# Patient Record
Sex: Male | Born: 1950
Health system: Southern US, Community
[De-identification: ages and names within clinical notes are randomized; demographics above are authoritative.]

## PROBLEM LIST (undated history)

## (undated) DIAGNOSIS — G629 Polyneuropathy, unspecified: Secondary | ICD-10-CM

## (undated) DIAGNOSIS — E786 Lipoprotein deficiency: Secondary | ICD-10-CM

## (undated) DIAGNOSIS — N182 Chronic kidney disease, stage 2 (mild): Secondary | ICD-10-CM

## (undated) DIAGNOSIS — E039 Hypothyroidism, unspecified: Secondary | ICD-10-CM

## (undated) DIAGNOSIS — E1122 Type 2 diabetes mellitus with diabetic chronic kidney disease: Secondary | ICD-10-CM

## (undated) DIAGNOSIS — I10 Essential (primary) hypertension: Secondary | ICD-10-CM

## (undated) DIAGNOSIS — F419 Anxiety disorder, unspecified: Secondary | ICD-10-CM

## (undated) DIAGNOSIS — G4730 Sleep apnea, unspecified: Secondary | ICD-10-CM

## (undated) DIAGNOSIS — G47419 Narcolepsy without cataplexy: Secondary | ICD-10-CM

## (undated) DIAGNOSIS — K219 Gastro-esophageal reflux disease without esophagitis: Secondary | ICD-10-CM

## (undated) DIAGNOSIS — K429 Umbilical hernia without obstruction or gangrene: Secondary | ICD-10-CM

## (undated) HISTORY — PX: COLONOSCOPY: SHX174

## (undated) HISTORY — DX: Essential (primary) hypertension: I10

## (undated) HISTORY — PX: OTHER SURGICAL HISTORY: SHX169

---

## 1998-02-25 ENCOUNTER — Ambulatory Visit (HOSPITAL_BASED_OUTPATIENT_CLINIC_OR_DEPARTMENT_OTHER): Admission: RE | Admit: 1998-02-25 | Discharge: 1998-02-25 | Payer: Self-pay | Admitting: Orthopedic Surgery

## 1998-03-29 ENCOUNTER — Ambulatory Visit: Admission: RE | Admit: 1998-03-29 | Discharge: 1998-03-29 | Payer: Self-pay | Admitting: Family Medicine

## 1998-06-10 ENCOUNTER — Ambulatory Visit: Admission: RE | Admit: 1998-06-10 | Discharge: 1998-06-10 | Payer: Self-pay | Admitting: Internal Medicine

## 1999-02-06 ENCOUNTER — Encounter (INDEPENDENT_AMBULATORY_CARE_PROVIDER_SITE_OTHER): Payer: Self-pay | Admitting: *Deleted

## 1999-02-06 ENCOUNTER — Ambulatory Visit (HOSPITAL_COMMUNITY): Admission: RE | Admit: 1999-02-06 | Discharge: 1999-02-06 | Payer: Self-pay | Admitting: Gastroenterology

## 2002-03-24 ENCOUNTER — Encounter: Admission: RE | Admit: 2002-03-24 | Discharge: 2002-06-22 | Payer: Self-pay | Admitting: Family Medicine

## 2003-03-12 ENCOUNTER — Encounter: Admission: RE | Admit: 2003-03-12 | Discharge: 2003-03-12 | Payer: Self-pay | Admitting: *Deleted

## 2003-11-04 ENCOUNTER — Ambulatory Visit (HOSPITAL_COMMUNITY): Admission: RE | Admit: 2003-11-04 | Discharge: 2003-11-04 | Payer: Self-pay | Admitting: Gastroenterology

## 2005-11-01 ENCOUNTER — Ambulatory Visit (HOSPITAL_COMMUNITY): Admission: RE | Admit: 2005-11-01 | Discharge: 2005-11-01 | Payer: Self-pay | Admitting: Surgery

## 2005-11-12 ENCOUNTER — Ambulatory Visit (HOSPITAL_COMMUNITY): Admission: RE | Admit: 2005-11-12 | Discharge: 2005-11-12 | Payer: Self-pay | Admitting: Surgery

## 2005-11-12 ENCOUNTER — Encounter: Admission: RE | Admit: 2005-11-12 | Discharge: 2005-11-12 | Payer: Self-pay | Admitting: Surgery

## 2006-02-04 ENCOUNTER — Encounter: Admission: RE | Admit: 2006-02-04 | Discharge: 2006-05-05 | Payer: Self-pay | Admitting: Surgery

## 2006-02-18 ENCOUNTER — Encounter (INDEPENDENT_AMBULATORY_CARE_PROVIDER_SITE_OTHER): Payer: Self-pay | Admitting: Specialist

## 2006-02-18 ENCOUNTER — Inpatient Hospital Stay (HOSPITAL_COMMUNITY): Admission: RE | Admit: 2006-02-18 | Discharge: 2006-02-19 | Payer: Self-pay | Admitting: Surgery

## 2006-02-18 HISTORY — PX: GASTRIC RESTRICTION SURGERY: SHX653

## 2009-06-28 HISTORY — PX: CARDIAC CATHETERIZATION: SHX172

## 2009-07-19 ENCOUNTER — Ambulatory Visit: Payer: Self-pay | Admitting: Cardiology

## 2009-07-19 ENCOUNTER — Observation Stay (HOSPITAL_COMMUNITY): Admission: EM | Admit: 2009-07-19 | Discharge: 2009-07-20 | Payer: Self-pay | Admitting: Emergency Medicine

## 2010-03-10 ENCOUNTER — Ambulatory Visit: Payer: Self-pay | Admitting: Women's Health

## 2010-07-24 LAB — LIPID PANEL
Cholesterol: 159 mg/dL (ref 0–200)
Cholesterol: 167 mg/dL (ref 0–200)
HDL: 24 mg/dL — ABNORMAL LOW (ref >39)
LDL Cholesterol: 109 mg/dL — ABNORMAL HIGH (ref 0–99)
LDL Cholesterol: 99 mg/dL (ref 0–99)
Total CHOL/HDL Ratio: 7 RATIO
Triglycerides: 172 mg/dL — ABNORMAL HIGH (ref <150)
Triglycerides: 174 mg/dL — ABNORMAL HIGH (ref <150)
VLDL: 34 mg/dL (ref 0–40)

## 2010-07-24 LAB — POCT CARDIAC MARKERS
CKMB, poc: <1 ng/mL — ABNORMAL LOW (ref 1.0–8.0)
CKMB, poc: <1 ng/mL — ABNORMAL LOW (ref 1.0–8.0)
Myoglobin, poc: 56.3 ng/mL (ref 12–200)
Myoglobin, poc: 73.5 ng/mL (ref 12–200)
Troponin i, poc: <0.05 ng/mL (ref 0.00–0.09)
Troponin i, poc: <0.05 ng/mL (ref 0.00–0.09)

## 2010-07-24 LAB — GLUCOSE, CAPILLARY
Glucose-Capillary: 115 mg/dL — ABNORMAL HIGH (ref 70–99)
Glucose-Capillary: 118 mg/dL — ABNORMAL HIGH (ref 70–99)
Glucose-Capillary: 124 mg/dL — ABNORMAL HIGH (ref 70–99)
Glucose-Capillary: 144 mg/dL — ABNORMAL HIGH (ref 70–99)
Glucose-Capillary: 161 mg/dL — ABNORMAL HIGH (ref 70–99)

## 2010-07-24 LAB — COMPREHENSIVE METABOLIC PANEL
ALT: 34 U/L (ref 0–53)
AST: 33 U/L (ref 0–37)
Albumin: 3.3 g/dL — ABNORMAL LOW (ref 3.5–5.2)
Alkaline Phosphatase: 92 U/L (ref 39–117)
BUN: 12 mg/dL (ref 6–23)
CO2: 29 mEq/L (ref 19–32)
Calcium: 8.9 mg/dL (ref 8.4–10.5)
Chloride: 105 mEq/L (ref 96–112)
Creatinine, Ser: 1.21 mg/dL (ref 0.4–1.5)
GFR calc Af Amer: >60 mL/min (ref >60)
GFR calc non Af Amer: >60 mL/min (ref >60)
Glucose, Bld: 131 mg/dL — ABNORMAL HIGH (ref 70–99)
Potassium: 4.2 mEq/L (ref 3.5–5.1)
Sodium: 142 mEq/L (ref 135–145)
Total Bilirubin: 0.7 mg/dL (ref 0.3–1.2)
Total Protein: 7.1 g/dL (ref 6.0–8.3)

## 2010-07-24 LAB — BASIC METABOLIC PANEL
BUN: 14 mg/dL (ref 6–23)
CO2: 27 mEq/L (ref 19–32)
Calcium: 8.9 mg/dL (ref 8.4–10.5)
Chloride: 105 mEq/L (ref 96–112)
Creatinine, Ser: 1.26 mg/dL (ref 0.4–1.5)
GFR calc Af Amer: >60 mL/min (ref >60)
GFR calc non Af Amer: 59 mL/min — ABNORMAL LOW (ref >60)
Glucose, Bld: 141 mg/dL — ABNORMAL HIGH (ref 70–99)
Potassium: 4.1 mEq/L (ref 3.5–5.1)
Sodium: 139 mEq/L (ref 135–145)

## 2010-07-24 LAB — CBC
HCT: 40.7 % (ref 39.0–52.0)
HCT: 41.8 % (ref 39.0–52.0)
Hemoglobin: 13.6 g/dL (ref 13.0–17.0)
Hemoglobin: 14.3 g/dL (ref 13.0–17.0)
MCHC: 33.4 g/dL (ref 30.0–36.0)
MCHC: 34.2 g/dL (ref 30.0–36.0)
MCV: 90.2 fL (ref 78.0–100.0)
MCV: 91.3 fL (ref 78.0–100.0)
Platelets: 197 10*3/uL (ref 150–400)
Platelets: 205 10*3/uL (ref 150–400)
RBC: 4.46 MIL/uL (ref 4.22–5.81)
RBC: 4.64 MIL/uL (ref 4.22–5.81)
RDW: 14.2 % (ref 11.5–15.5)
RDW: 14.3 % (ref 11.5–15.5)
WBC: 6.1 10*3/uL (ref 4.0–10.5)
WBC: 6.8 10*3/uL (ref 4.0–10.5)

## 2010-07-24 LAB — HEPATIC FUNCTION PANEL
ALT: 30 U/L (ref 0–53)
AST: 31 U/L (ref 0–37)
Albumin: 3.5 g/dL (ref 3.5–5.2)
Alkaline Phosphatase: 102 U/L (ref 39–117)
Bilirubin, Direct: 0.3 mg/dL (ref 0.0–0.3)
Indirect Bilirubin: 0.2 mg/dL — ABNORMAL LOW (ref 0.3–0.9)
Total Bilirubin: 0.5 mg/dL (ref 0.3–1.2)
Total Protein: 7.5 g/dL (ref 6.0–8.3)

## 2010-07-24 LAB — CK TOTAL AND CKMB (NOT AT ARMC)
CK, MB: 0.6 ng/mL (ref 0.3–4.0)
CK, MB: 0.6 ng/mL (ref 0.3–4.0)
CK, MB: 0.7 ng/mL (ref 0.3–4.0)
Relative Index: INVALID (ref 0.0–2.5)
Relative Index: INVALID (ref 0.0–2.5)
Relative Index: INVALID (ref 0.0–2.5)
Total CK: 56 U/L (ref 7–232)
Total CK: 62 U/L (ref 7–232)
Total CK: 70 U/L (ref 7–232)

## 2010-07-24 LAB — HEMOGLOBIN A1C
Hgb A1c MFr Bld: 6.8 % — ABNORMAL HIGH (ref 4.6–6.1)
Hgb A1c MFr Bld: 6.9 % — ABNORMAL HIGH (ref 4.6–6.1)
Mean Plasma Glucose: 148 mg/dL
Mean Plasma Glucose: 151 mg/dL

## 2010-07-24 LAB — DIFFERENTIAL
Basophils Absolute: 0.1 10*3/uL (ref 0.0–0.1)
Basophils Relative: 1 % (ref 0–1)
Eosinophils Absolute: 0.1 10*3/uL (ref 0.0–0.7)
Eosinophils Relative: 1 % (ref 0–5)
Lymphocytes Relative: 26 % (ref 12–46)
Lymphs Abs: 1.6 10*3/uL (ref 0.7–4.0)
Monocytes Absolute: 0.5 10*3/uL (ref 0.1–1.0)
Monocytes Relative: 9 % (ref 3–12)
Neutro Abs: 3.9 10*3/uL (ref 1.7–7.7)
Neutrophils Relative %: 64 % (ref 43–77)

## 2010-07-24 LAB — TROPONIN I: Troponin I: <0.01 ng/mL (ref 0.00–0.06)

## 2010-07-24 LAB — PROTIME-INR: Prothrombin Time: 13.7 seconds (ref 11.6–15.2)

## 2010-07-24 LAB — HEPARIN LEVEL (UNFRACTIONATED): Heparin Unfractionated: 0.23 IU/mL — ABNORMAL LOW (ref 0.30–0.70)

## 2010-09-15 NOTE — Op Note (Signed)
William Booth, William Booth               ACCOUNT NO.:  [id number]   MEDICAL RECORD NO.:  [id number]          PATIENT TYPE:  INP   LOCATION:  0152                         FACILITY:  [facility]   PHYSICIAN:  Sandria Bales. Ezzard Standing, M.D.  DATE OF BIRTH:  [birth year]   DATE OF PROCEDURE:  02/18/2006  DATE OF DISCHARGE:                                 OPERATIVE REPORT   PREOPERATIVE DIAGNOSIS:  Morbid obesity weight 351 with a BMI of  approximately 50.5.   POSTOPERATIVE DIAGNOSIS:  Morbid obesity weight 351 with a BMI of 50.5.   OPERATION PERFORMED:  Laparoscopic gastric band placement (AP large),  posterior vagotomy successful, but I was not able to find the anterior vagus  nerve.   SURGEON:  Sandria Bales. Ezzard Standing, M.D.   ASSISTANT:  Sharlet Salina T. Hoxworth, M.D.   ANESTHESIA:  General.   ESTIMATED BLOOD LOSS:  50 mL.   DRAINS:  None.   INDICATIONS FOR PROCEDURE:  Mr. Muckey is a 60 year old white male who has  been through our bariatric preoperative program included medical evaluation,  labs, x-rays, psych and nutritional evaluation. He has come for attempted  lap band placement.  He is also interested in our vagotomy study and  understands both indication and potential risks of the procedure.   The potential risks include but not limited to bleeding, infection, bowel  injury and long term nutritional consequences including slippage and erosion  of the bands.   DESCRIPTION OF PROCEDURE:  Patient in supine position with general  endotracheal anesthesia, given 1 g Ancef initiation of procedure.  He had  his abdomen shaved, prepped with Betadine solution and sterilely draped.  The abdomen was accessed with an Optiview trocar in the left upper  extremity.  I then placed five additional trocars, a 5 mm subxiphoid trocar  for the [facility] retractor, a 15 right subcostal trocar as a dissector, an  11 right paramedian trocar for the finger placement.  A left paramedian  trocar for the scope and a 5 mm  lateral subcostal trocar.     The abdomen was explored.  The right and left lobes of liver were large  and fatty but otherwise unremarkable.  Anterior wall of the stomach was  fatty and unremarkable.  He does have an umbilical hernia with incarcerated  fat in the hernia.  I left this alone during the procedure.  I placed a  Nathanson retractor on the left lobe of the liver and retracted this  anteriorly.   I then went up and started first with vagotomy, opening the gastrohepatic  ligament identifying the right crus of the diaphragm.  I was able to retract  the esophagus to the left side the crus down to the right and found a large  posterior vagus nerve which I placed clips on both sides and divided.  Photos of these views are on the chart.   I then worked for probably about 45 minutes on the anterior wall of the  stomach looking for a left anterior vagus but I was unsuccessful finding  anything that looked like a vagus nerve.  I also  placed a lighted bougie  down to see if I could see any denser material going through and I found  nothing there either.   So after about 45 minutes of dissection trying to identify an anterior vagus  nerve (I did not want to damage the esophagus), I terminated that part of  the dissection.   I then placed the finger behind the esophagus up at the ankle of His.  I  placed a lap band. I used the AP large band in place.   I then buttressed the stomach over the band using three interrupted 2-0  Ethibond sutures with lap ties on the end of these.  I placed this over a  sizer to make sure that it was not to tight.  The band then rotated, would  swivel easily after the stomach had been buttressed over the band.  I then  removed the end of the band out the right paramedian incision.   Each trocar was removed in turn.  There was not bleeding  in the trocar  site.  I then matured the wound in the right abdomen to place the port.  Sewed the port in place with  interrupted 2-0 Prolene sutures.  This made the  band lie flat.  Of note he only has about an  inch and a half of  subcutaneous fat so despite his large size, he actually most of his fat is  intra-abdominal not subcu.   I then closed each wound with first interrupted 2-0 Vicryl sutures, then 5-0  Monocryl suture in the fascia.  The wound is then painted with tincture of  benzoin and Steri-Stripped.  The patient was then transferred to the  recovery room in good condition.      Sandria Bales. Ezzard Standing, M.D.  Electronically Signed     DHN/MEDQ  D:  02/18/2006  T:  02/18/2006  Job:  [id number]   cc:   Caryn Bee L. Little, M.D.  Fax: [phone]

## 2010-09-15 NOTE — Op Note (Signed)
William Booth, William Booth                         ACCOUNT NO.:  [id number]   MEDICAL RECORD NO.:  [id number]                   PATIENT TYPE:  AMB   LOCATION:  ENDO                                 FACILITY:  [facility]   PHYSICIAN:  Bernette Redbird, M.D.                DATE OF BIRTH:  08-Oct-1950   DATE OF PROCEDURE:  11/04/2003  DATE OF DISCHARGE:                                 OPERATIVE REPORT   PROCEDURE:  Colonoscopy.   INDICATIONS FOR PROCEDURE:  Small volume hematochezia and prior history of  colonic adenoma removed five years ago in a 60 year old gentleman.   FINDINGS:  Normal exam to the cecum.   PROCEDURE:  The nature, purpose, and risks of the procedure were familiar to  the patient from prior examination who provided written consent.  Sedation  was administered on the conservative side in view of his significant obesity  and history of sleep apnea.  During the course of the procedure, he did  receive a total of Fentanyl 80 mcg and Versed 8 mg IV, but this resulted in  just mild sedation probably due to his ongoing outpatient use of medications  such as Zoloft plus his large body size.  There was no problem with  desaturation, apnea, or clinical instability during the course of the exam.  Digital exam of the prostate was limited by the patient's large body size  but the portion of the prostate gland I could feel was normal.   The Olympus adult video colonoscope was easily advanced around the colon to  the area just above the cecum and then the patient was turned into the  supine position and I was able to advance easily to the base of the cecum as  identified by clear visualization of the appendiceal orifice.  Pull back was  then performed.  The quality of the prep was excellent and it was felt that  all areas were well seen.  This was a normal examination.  No polyps,  polyps, cancer, colitis, vascular malformations, or diverticulosis were  noted.  Retroflexion in the rectum was  normal.  Reinspection of the rectum  was normal.  Pull out through the anal canal showed some slight excoriation  with mild internal hemorrhoids, nothing impressive.   IMPRESSION:  1. Rectal bleeding without obvious source endoscopically evident at this     time, but probably from excoriated     hemorrhoids (569.3).  2. Prior history of colonic adenoma without current abnormalities.   PLAN:  Follow up colonoscopy in five years.                                               Bernette Redbird, M.D.    RB/MEDQ  D:  11/04/2003  T:  11/04/2003  Job:  [id number]  cc:   Caryn Bee L. Little, M.D.  [address]  [place]  [place] [address]  Fax: [phone]

## 2010-10-06 ENCOUNTER — Encounter (INDEPENDENT_AMBULATORY_CARE_PROVIDER_SITE_OTHER): Payer: Self-pay | Admitting: Surgery

## 2011-03-08 ENCOUNTER — Encounter (INDEPENDENT_AMBULATORY_CARE_PROVIDER_SITE_OTHER): Payer: Self-pay | Admitting: Surgery

## 2011-03-09 ENCOUNTER — Encounter (INDEPENDENT_AMBULATORY_CARE_PROVIDER_SITE_OTHER): Payer: Self-pay

## 2011-03-09 ENCOUNTER — Ambulatory Visit (INDEPENDENT_AMBULATORY_CARE_PROVIDER_SITE_OTHER): Payer: 59 | Admitting: Physician Assistant

## 2011-03-09 VITALS — BP 130/86 | HR 70 | Temp 97.4°F | Resp 14 | Ht 71.0 in | Wt 269.2 lb

## 2011-03-09 DIAGNOSIS — Z4651 Encounter for fitting and adjustment of gastric lap band: Secondary | ICD-10-CM

## 2011-03-09 NOTE — Patient Instructions (Signed)
Return in one week if symptoms persist.

## 2011-03-09 NOTE — Progress Notes (Signed)
  HISTORY: William Booth is a 60 y.o.male who received an AP-Large lap-band in October 2007 by Dr. Ezzard Standing. He said in the past month he's been having reflux symptoms which has been helped with a recent Rx for PPI but his symptoms persist.  VITAL SIGNS: Filed Vitals:   03/09/11 1348  BP: 130/86  Pulse: 70  Temp: 97.4 F (36.3 C)  Resp: 14    PHYSICAL EXAM: Physical exam reveals a very well-appearing 60 y.o.male in no apparent distress Neurologic: Awake, alert, oriented Psych: Bright affect, conversant Respiratory: Breathing even and unlabored. No stridor or wheezing Abdomen: Soft, nontender, nondistended to palpation. Incisions well-healed. No incisional hernias. Port easily palpated. Extremities: Atraumatic, good range of motion.  ASSESMENT: 60 y.o.  male  s/p AP-Large lap-band.   PLAN: The patient's port was accessed with a 20G Huber needle without difficulty. Clear fluid was aspirated and 0.25 mL saline was removed from the port to give a total predicted volume of 9.5 mL. The patient was able to swallow water without difficulty following the procedure and was instructed to take clear liquids for the next 24-48 hours and advance slowly as tolerated.

## 2011-03-20 ENCOUNTER — Ambulatory Visit (INDEPENDENT_AMBULATORY_CARE_PROVIDER_SITE_OTHER): Payer: 59 | Admitting: *Deleted

## 2011-03-20 DIAGNOSIS — Z23 Encounter for immunization: Secondary | ICD-10-CM

## 2011-04-13 ENCOUNTER — Encounter (INDEPENDENT_AMBULATORY_CARE_PROVIDER_SITE_OTHER): Payer: 59

## 2011-04-18 ENCOUNTER — Encounter (INDEPENDENT_AMBULATORY_CARE_PROVIDER_SITE_OTHER): Payer: Self-pay | Admitting: Surgery

## 2011-04-20 ENCOUNTER — Ambulatory Visit (INDEPENDENT_AMBULATORY_CARE_PROVIDER_SITE_OTHER): Payer: 59 | Admitting: Physician Assistant

## 2011-04-20 ENCOUNTER — Encounter (INDEPENDENT_AMBULATORY_CARE_PROVIDER_SITE_OTHER): Payer: Self-pay

## 2011-04-20 VITALS — BP 124/78 | Ht 71.0 in | Wt 287.2 lb

## 2011-04-20 DIAGNOSIS — Z4651 Encounter for fitting and adjustment of gastric lap band: Secondary | ICD-10-CM

## 2011-04-20 NOTE — Progress Notes (Signed)
  HISTORY: William Booth is a 60 y.o.male who received an AP-Large lap-band in October 2007 by Dr. Ezzard Standing. He was seen last month for GERD symptoms for which 0.25 mL was removed. Since then his symptoms have resolved although he has significant hunger and increased portion sizes as well as weight gain. He's requesting a fill today, but just shy of the last total volume. His last upward adjustment lasted 4 months without untoward symptoms.  VITAL SIGNS: Filed Vitals:   04/20/11 1030  BP: 124/78    PHYSICAL EXAM: Physical exam reveals a very well-appearing 60 y.o.male in no apparent distress Neurologic: Awake, alert, oriented Psych: Bright affect, conversant Respiratory: Breathing even and unlabored. No stridor or wheezing Abdomen: Soft, nontender, nondistended to palpation. Incisions well-healed. No incisional hernias. Port easily palpated. Extremities: Atraumatic, good range of motion.  ASSESMENT: 60 y.o.  male  s/p AP-Large lap-band.   PLAN: The patient's port was accessed with a 20G Huber needle without difficulty. Clear fluid was aspirated and 0.2 mL saline was added to the port to give a total predicted volume of 9.7 mL. The patient was able to swallow water without difficulty following the procedure and was instructed to take clear liquids for the next 24-48 hours and advance slowly as tolerated.

## 2011-04-20 NOTE — Patient Instructions (Signed)
Take clear liquids for the next 48 hours. Thin protein shakes are ok to start on Saturday evening. Call us if you have persistent vomiting or regurgitation, night cough or reflux symptoms. Return as scheduled or sooner if you notice no changes in hunger/portion sizes.   

## 2011-09-03 ENCOUNTER — Encounter (INDEPENDENT_AMBULATORY_CARE_PROVIDER_SITE_OTHER): Payer: Self-pay | Admitting: Surgery

## 2011-12-03 ENCOUNTER — Encounter: Payer: Self-pay | Admitting: Internal Medicine

## 2011-12-03 ENCOUNTER — Ambulatory Visit (INDEPENDENT_AMBULATORY_CARE_PROVIDER_SITE_OTHER): Payer: 59 | Admitting: Internal Medicine

## 2011-12-03 VITALS — BP 130/82 | HR 73 | Ht 70.0 in | Wt 278.2 lb

## 2011-12-03 DIAGNOSIS — G4733 Obstructive sleep apnea (adult) (pediatric): Secondary | ICD-10-CM

## 2011-12-03 NOTE — Patient Instructions (Addendum)
Order - DME needs to establish with new company for CPAP   Needs autotitration 5-20 x 7 days for dx OSA  Sample Nuvigil 150 mg, 1 daily as needed

## 2011-12-03 NOTE — Progress Notes (Signed)
12/03/11- 62 yoM never smoker formerly a patient of my old practice, now seeking to reestablish because of sleep apnea. PCP Dr Kirtland Bouchard. William Booth Complains of daytime sleepiness, falling asleep during meetings and with long driving. He has remained fully compliant with his CPAP 11, getting supplies on line. Bedtime midnight to 1:30 AM with sleep latency less than 15 minutes and not waking until he gets up between 9 and 10 AM. 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 Wife tells him he does not snore through his current CPAP, using nasal pillows mask. Occasionally uses Elavil 100 mg as a sleeping pill only when traveling. He has been very heavy but says he lost over 100 pounds in 4 years. Lab band surgery at 389 pounds, down now around 278 pounds. No ENT surgery. Medical problems include hypertension, diabetes, allergic rhinitis. GERD is controlled with omeprazole.  Prior to Admission medications   Medication Sig Start Date End Date Taking? Authorizing Provider  ACCU-CHEK FASTCLIX LANCETS MISC  12/26/10  Yes Historical Provider, MD  ACCU-CHEK SMARTVIEW test strip  12/26/10  Yes Historical Provider, MD  ALPRAZolam Prudy Feeler) 0.5 MG tablet Ad lib. 02/16/11  Yes Historical Provider, MD  amitriptyline (ELAVIL) 100 MG tablet Ad lib. 02/14/11  Yes Historical Provider, MD  aspirin 81 MG tablet Take 81 mg by mouth daily.     Yes Historical Provider, MD  atenolol (TENORMIN) 50 MG tablet Take 50 mg by mouth 2 (two) times daily.   Yes Historical Provider, MD  fenofibrate 160 MG tablet Take 160 mg by mouth daily.   Yes Historical Provider, MD  fluticasone (FLONASE) 50 MCG/ACT nasal spray Ad lib. 12/25/10  Yes Historical Provider, MD  gabapentin (NEURONTIN) 300 MG capsule Take 1 tablet by mouth Three times a day. 10/31/11  Yes Historical Provider, MD  glimepiride (AMARYL) 4 MG tablet Take 4 mg by mouth 2 (two) times daily.   Yes Historical Provider, MD    losartan-hydrochlorothiazide (HYZAAR) 100-25 MG per tablet Take 1 tablet by mouth daily.     Yes Historical Provider, MD  metFORMIN (GLUCOPHAGE) 1000 MG tablet Take 1,000 mg by mouth 2 (two) times daily with a meal.     Yes Historical Provider, MD  niacin (NIASPAN) 500 MG CR tablet Take 500 mg by mouth at bedtime.   Yes Historical Provider, MD  NON FORMULARY CPAP set on 11   Yes Historical Provider, MD  omeprazole (PRILOSEC) 40 MG capsule Take 40 mg by mouth daily.   Yes Historical Provider, MD  rosuvastatin (CRESTOR) 20 MG tablet Take 20 mg by mouth daily.   Yes Historical Provider, MD   pmh Past Surgical History  Procedure Date  . Gastric restriction surgery 02/18/06    lap band  . Broken finger     right hand   History reviewed. No pertinent family history. History   Social History  . Marital Status: Married    Spouse Name: N/A    Number of Children: N/A  . Years of Education: N/A   Occupational History  . Not on file.   Social History Main Topics  . Smoking status: Never Smoker   . Smokeless tobacco: Never Used  . Alcohol Use: No  . Drug Use: No  . Sexually Active:    Other Topics Concern  . Not on file   Social History Narrative  . No narrative on file   ROS-see HPI Constitutional:   + weight loss, No-night sweats,  fevers, chills, fatigue, lassitude. HEENT:   No-  headaches, difficulty swallowing, tooth/dental problems, sore throat,       No-  sneezing, itching, ear ache, nasal congestion, post nasal drip,  CV:  No-   chest pain, orthopnea, PND, swelling in lower extremities, anasarca,  dizziness, palpitations Resp: No-   shortness of breath with exertion or at rest.              No-   productive cough,  No non-productive cough,  No- coughing up of blood.              No-   change in color of mucus.  No- wheezing.   Skin: No-   rash or lesions. GI:  No-   heartburn, indigestion, abdominal pain, nausea, vomiting, diarrhea,                 change in bowel  habits, loss of appetite GU: No-   dysuria, change in color of urine, no urgency or frequency.  No- flank pain. MS:  No-   joint pain or swelling.  No- decreased range of motion.  No- back pain. Neuro-     nothing unusual Psych:  No- change in mood or affect. No depression or anxiety.  No memory loss.  OBJ- Physical Exam General- Alert, Oriented, Affect-appropriate, Distress- none acute, obese Skin- rash-none, lesions- none, excoriation- none Lymphadenopathy- none Head- atraumatic            Eyes- Gross vision intact, PERRLA, conjunctivae and secretions clear            Ears- Hearing, canals-normal            Nose- Clear, no-Septal dev, mucus, polyps, erosion, perforation             Throat- Mallampati III , mucosa clear , drainage- none, tonsils- atrophic Neck- flexible , trachea midline, no stridor , thyroid nl, carotid no bruit Chest - symmetrical excursion , unlabored           Heart/CV- RRR , no murmur , no gallop  , no rub, nl s1 s2                           - JVD- none , edema- none, stasis changes- none, varices- none           Lung- clear to P&A, wheeze- none, cough- none , dullness-none, rub- none           Chest wall-  Abd- tender-no, distended-no, bowel sounds-present, HSM- no Br/ Gen/ Rectal- Not done, not indicated Extrem- cyanosis- none, clubbing, none, atrophy- none, strength- nl Neuro- grossly intact to observation

## 2011-12-08 DIAGNOSIS — G4733 Obstructive sleep apnea (adult) (pediatric): Secondary | ICD-10-CM | POA: Insufficient documentation

## 2011-12-08 NOTE — Assessment & Plan Note (Signed)
He has been compliant with CPAP. We will auto titrate to confirm best pressure setting the problem may be residual hypersomnia despite adequate treatment. Neck is he will and allergic medication. We're giving samples of Nuvigil 150 for trial pending return.

## 2011-12-12 ENCOUNTER — Telehealth: Payer: Self-pay | Admitting: Internal Medicine

## 2011-12-12 NOTE — Telephone Encounter (Signed)
I spoke with pt and he stated the samples that was giving to him at last OV 12/03/11 of nuvigil 150 mg has been working well for him. He has a coupon to get the first 30 tablets free from the manufacture. He would like an rx called in for this. Please advise Dr. Maple Hudson thanks  Pt aware he is out of the office today.

## 2011-12-13 MED ORDER — ARMODAFINIL 150 MG PO TABS: 150.0000 mg | ORAL_TABLET | Freq: Every day | ORAL | Status: DC

## 2011-12-13 NOTE — Telephone Encounter (Signed)
Per CY, ok to fill Rx Nuvigil 150 mg #30  1 qam prn. x0RF [facility] [address]  LMOM letting patient no that medication phoned-in to [facility] [address]

## 2011-12-24 ENCOUNTER — Encounter: Payer: Self-pay | Admitting: Internal Medicine

## 2012-01-04 ENCOUNTER — Ambulatory Visit (INDEPENDENT_AMBULATORY_CARE_PROVIDER_SITE_OTHER): Payer: 59 | Admitting: Internal Medicine

## 2012-01-04 ENCOUNTER — Encounter: Payer: Self-pay | Admitting: Internal Medicine

## 2012-01-04 VITALS — BP 120/74 | HR 77 | Ht 70.0 in | Wt 279.6 lb

## 2012-01-04 DIAGNOSIS — G4733 Obstructive sleep apnea (adult) (pediatric): Secondary | ICD-10-CM

## 2012-01-04 DIAGNOSIS — Z23 Encounter for immunization: Secondary | ICD-10-CM

## 2012-01-04 MED ORDER — MODAFINIL 200 MG PO TABS: 200.0000 mg | ORAL_TABLET | Freq: Every day | ORAL | Status: DC

## 2012-01-04 NOTE — Patient Instructions (Addendum)
Order [facility]- increase CPAP to 12  Script for Provigil 200. Try this once daily as needed

## 2012-01-04 NOTE — Progress Notes (Signed)
12/03/11- 74 yoM never smoker formerly a patient of my old practice, now seeking to reestablish because of sleep apnea. PCP Dr Kirtland Bouchard. William Booth Complains of daytime sleepiness, falling asleep during meetings and with long driving. He has remained fully compliant with his CPAP 11, getting supplies on line. Bedtime midnight to 1:30 AM with sleep latency less than 15 minutes and not waking until he gets up between 9 and 10 AM. 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 Wife tells him he does not snore through his current CPAP, using nasal pillows mask. Occasionally uses Elavil 100 mg as a sleeping pill only when traveling. He has been very heavy but says he lost over 100 pounds in 4 years. Lab band surgery at 389 pounds, down now around 278 pounds. No ENT surgery. Medical problems include hypertension, diabetes, allergic rhinitis. GERD is controlled with omeprazole.  01/04/12- 68 yoM never smoker formerly a patient of my old practice, now seeking to reestablish because of sleep apnea. PCP Dr Kirtland Bouchard. [name]. Machine changed out. Download is still pending. Sample Nuvigil 150 helped but was not quite strong enough for residual daytime sleepiness. We discussed medication alternatives, sleep hygiene, naps and expectations from optimal CPAP control.  ROS-see HPI Constitutional:   + weight loss, No-night sweats, fevers, chills,+ fatigue, lassitude. HEENT:   No-  headaches, difficulty swallowing, tooth/dental problems, sore throat,       No-  sneezing, itching, ear ache, nasal congestion, post nasal drip,  CV:  No-   chest pain, orthopnea, PND, swelling in lower extremities, anasarca,  dizziness, palpitations Resp: No-   shortness of breath with exertion or at rest.              No-   productive cough,  No non-productive cough,  No- coughing up of blood.              No-   change in color of mucus.  No- wheezing.   Skin: No-   rash or lesions. GI:  No-    heartburn, indigestion, abdominal pain, nausea, vomiting,  GU: MS:  No-   joint pain or swelling.   Neuro-     nothing unusual Psych:  No- change in mood or affect. No depression or anxiety.  No memory loss.  OBJ- Physical Exam General- Alert, Oriented, Affect-appropriate, Distress- none acute, obese Skin- rash-none, lesions- none, excoriation- none Lymphadenopathy- none Head- atraumatic            Eyes- Gross vision intact, PERRLA, conjunctivae and secretions clear            Ears- Hearing, canals-normal            Nose- Clear, no-Septal dev, mucus, polyps, erosion, perforation             Throat- Mallampati III , mucosa clear , drainage- none, tonsils- atrophic Neck- flexible , trachea midline, no stridor , thyroid nl, carotid no bruit Chest - symmetrical excursion , unlabored           Heart/CV- RRR , no murmur , no gallop  , no rub, nl s1 s2                           - JVD- none , edema- none, stasis changes- none, varices- none           Lung- clear to P&A, wheeze- none, cough- none ,  dullness-none, rub- none           Chest wall-  Abd-  Br/ Gen/ Rectal- Not done, not indicated Extrem- cyanosis- none, clubbing, none, atrophy- none, strength- nl Neuro- grossly intact to observation

## 2012-01-12 NOTE — Assessment & Plan Note (Signed)
Weight is now down to 279 pounds. He feels very comfortable with CPAP. Pending the download, he thinks it indicated a pressure of 12 he is now on 11, so we'll make a lot of difference. We discussed medication options we'll try cheaper generic. Plan-increase CPAP to 12.Try Provigil instead of Nuvigil.

## 2012-01-18 ENCOUNTER — Telehealth: Payer: Self-pay | Admitting: Internal Medicine

## 2012-01-18 NOTE — Telephone Encounter (Signed)
Spoke with patient-aware that I am calling [facility] [address] to get PA information faxed to me; states he would also like to get 90 days supply of Provigil(generic) as it costs the same as a 30 days supply. Will get CY to approve this once PA is completed and approved.

## 2012-01-25 NOTE — Telephone Encounter (Signed)
William Booth, has PA came through yet?

## 2012-01-25 NOTE — Telephone Encounter (Signed)
Pt aware that I am working with [employer] about this. Will keep him updated on Monday.

## 2012-01-25 NOTE — Telephone Encounter (Signed)
Pt is calling to see about this rx.  Please call him back @ [phone] Leanora Ivanoff

## 2012-01-28 NOTE — Telephone Encounter (Signed)
Insurance had to refax information and I have now faxed back as a URGENT request to insurance company for approval/denial.

## 2012-01-29 NOTE — Telephone Encounter (Signed)
I spoke with pt and is aware William Booth is working on this and had re faxed information back over yesterday. Will forward to [name] to follow up on

## 2012-01-29 NOTE — Telephone Encounter (Signed)
Pt called back- says he called [facility] today (this is where the auth needs to go thru per pt) and was told they are still waiting on this. Pt needs this to be resolved today "please"- member ID if needed is [id number]. Pt ph# [phone]. William Booth

## 2012-01-31 ENCOUNTER — Encounter (INDEPENDENT_AMBULATORY_CARE_PROVIDER_SITE_OTHER): Payer: Self-pay

## 2012-01-31 ENCOUNTER — Ambulatory Visit (INDEPENDENT_AMBULATORY_CARE_PROVIDER_SITE_OTHER): Payer: 59 | Admitting: Physician Assistant

## 2012-01-31 VITALS — BP 124/68 | HR 76 | Temp 98.5°F | Resp 16 | Ht 71.0 in | Wt 280.0 lb

## 2012-01-31 DIAGNOSIS — Z6839 Body mass index (BMI) 39.0-39.9, adult: Secondary | ICD-10-CM

## 2012-01-31 DIAGNOSIS — Z4651 Encounter for fitting and adjustment of gastric lap band: Secondary | ICD-10-CM

## 2012-01-31 DIAGNOSIS — K429 Umbilical hernia without obstruction or gangrene: Secondary | ICD-10-CM

## 2012-01-31 NOTE — Progress Notes (Signed)
  HISTORY: William Booth is a 61 y.o.male who received an AP-Large lap-band in October 2007 by Dr. Ezzard Standing. He comes in with no complaints of regurgitation or reflux but has increasing hunger and portion sizes. He also reports feeling a bulge from his umbilicus when he stands up. 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 He denies persistent pain. He's had no difficulty passing flatus or stool. There has been no redness or tenderness.  VITAL SIGNS: Filed Vitals:   01/31/12 1100  BP: 124/68  Pulse: 76  Temp: 98.5 F (36.9 C)  Resp: 16    PHYSICAL EXAM: Physical exam reveals a very well-appearing 61 y.o.male in no apparent distress Neurologic: Awake, alert, oriented Psych: Bright affect, conversant Respiratory: Breathing even and unlabored. No stridor or wheezing Abdomen: Soft, nontender, nondistended to palpation. Incisions well-healed. No incisional hernias. Port easily palpated. He has what appears to be a small, nontender umbilical hernia that I was unable to reduce. Extremities: Atraumatic, good range of motion.  ASSESMENT: 61 y.o.  male  s/p AP-Large lap-band.  New diagnosis of umbilical hernia  PLAN: The patient's port was accessed with a 20G Huber needle without difficulty. Clear fluid was aspirated and 0.2 mL saline was added to the port to give a total predicted volume of 9.9 mL. The patient was able to swallow water without difficulty following the procedure and was instructed to take clear liquids for the next 24-48 hours and advance slowly as tolerated. We have discussed his umbilical hernia with Dr. Ezzard Standing who will see him in the upcoming weeks to establish a treatment plan. We discussed strict return/contact criteria including obstructive symptoms/infection.

## 2012-01-31 NOTE — Patient Instructions (Signed)
Take clear liquids tonight. Thin protein shakes are ok to start tomorrow morning. Slowly advance your diet thereafter. Call us if you have persistent vomiting or regurgitation, night cough or reflux symptoms. Return as scheduled or sooner if you notice no changes in hunger/portion sizes. Also call the office if you have increasing pain, redness or swelling from your belly button. Call the office immediately if you have abdominal distension, nausea/vomiting and constipation/unable to pass gas.

## 2012-02-01 MED ORDER — MODAFINIL 200 MG PO TABS: 200.0000 mg | ORAL_TABLET | Freq: Every day | ORAL | Status: DC

## 2012-02-01 NOTE — Telephone Encounter (Signed)
I spoke with [employer] about PA and had to give more verbal information about patient. PA was approved for 1 year and patient able to get 90 day supply at local [facility] or [facility]. I have left message on patients phone number to call and speak with me regarding this matter. I will need to see if patient would like to come by and pick up Rx or have Rx mailed to his home address on file.

## 2012-02-01 NOTE — Telephone Encounter (Signed)
Pt returned my call-aware of approval for 1 year and requests to pick up 90 day supply RX. Pt aware that he can come by today and pick up. Pt stated he is on his way. Rx at my desk to give to patient.

## 2012-02-21 ENCOUNTER — Encounter (INDEPENDENT_AMBULATORY_CARE_PROVIDER_SITE_OTHER): Payer: 59

## 2012-02-21 ENCOUNTER — Other Ambulatory Visit (INDEPENDENT_AMBULATORY_CARE_PROVIDER_SITE_OTHER): Payer: Self-pay | Admitting: Surgery

## 2012-02-21 ENCOUNTER — Encounter (INDEPENDENT_AMBULATORY_CARE_PROVIDER_SITE_OTHER): Payer: Self-pay | Admitting: Surgery

## 2012-02-21 ENCOUNTER — Ambulatory Visit (INDEPENDENT_AMBULATORY_CARE_PROVIDER_SITE_OTHER): Payer: 59 | Admitting: Surgery

## 2012-02-21 VITALS — BP 140/88 | HR 72 | Resp 12 | Ht 71.0 in | Wt 276.0 lb

## 2012-02-21 DIAGNOSIS — Z9884 Bariatric surgery status: Secondary | ICD-10-CM | POA: Insufficient documentation

## 2012-02-21 DIAGNOSIS — K429 Umbilical hernia without obstruction or gangrene: Secondary | ICD-10-CM | POA: Insufficient documentation

## 2012-02-21 NOTE — Progress Notes (Signed)
[facility]  William Garden, MD,  FACS [address]  [address] [place], [place]    [address] Phone:  [phone] FAX:  [phone]   Re:   William Booth DOB:   03/11/1951 MRN:   [id number]  ASSESSMENT AND PLAN: 1.  History of lap band, AP Large - 02/18/2006  Initial weight - 348, BMI - 49.6  Still he thinks that his lap band is too loose.  I added 0.2 cc today - total approx 10.1 cc.  2. Umbilical hernia.  The hernia has enlarged and is bothering him.  I discussed the indications and complications of hernia surgery with the patient.  I discussed both the laparoscopic and open approach to hernia repair..  The potential risks of hernia surgery include, but are not limited to, bleeding, infection, open surgery, nerve injury, and recurrence of the hernia.  I provided the patient literature about hernia surgery.  3.  DM 4.  HTN  Doing better with this. 5.  OSA  Sees Dr. C. Young.  On CPAP. 6.  Anxiety   HISTORY OF PRESENT ILLNESS: Chief Complaint  Patient presents with  . Lap Band Fill  . Incisional Hernia    William Booth is a 61 y.o. (DOB: 06/08/1950)  white male who is a patient of LITTLE,KEVIN LORNE, MD and comes to me today for follow up of lap band and to discuss repair of umbilical hernia.  He still thinks that he needs a small adjustment for the lap band.  He is walking about 1/2 mile BID.  I encouraged him to get up to 3 miles per day.  Secondly, I talked about adding solid food to his breakfast.  We discussed the "green" zone for the lap band and he expressed an understanding of what we are looking for in eating.  Then we talked about his umibilcal hernia.  He has noticed this about one year and it has become larger.  It is bothering him and has enlarged.  Current Outpatient Prescriptions  Medication Sig Dispense Refill  . ACCU-CHEK FASTCLIX LANCETS MISC       . ACCU-CHEK SMARTVIEW test strip       . ALPRAZolam (XANAX) 0.5 MG tablet Ad  lib.      . amitriptyline (ELAVIL) 100 MG tablet Ad lib.      . Armodafinil 150 MG tablet Take 150 mg by mouth daily.      . aspirin 81 MG tablet Take 81 mg by mouth daily.        . atenolol (TENORMIN) 50 MG tablet Take 50 mg by mouth 2 (two) times daily.      . fenofibrate 160 MG tablet Take 160 mg by mouth daily.      . FLUoxetine (PROZAC) 40 MG capsule Take 1 tablet by mouth daily.      . fluticasone (FLONASE) 50 MCG/ACT nasal spray Ad lib.      . glimepiride (AMARYL) 4 MG tablet Take 4 mg by mouth 2 (two) times daily.      . losartan-hydrochlorothiazide (HYZAAR) 100-25 MG per tablet Take 1 tablet by mouth daily.        . metFORMIN (GLUCOPHAGE) 1000 MG tablet Take 1,000 mg by mouth 2 (two) times daily with a meal.        . modafinil (PROVIGIL) 200 MG tablet Take 1 tablet (200 mg total) by mouth daily.  90 tablet  0  . niacin (NIASPAN) 500 MG CR tablet Take 500   mg by mouth at bedtime.      . NON FORMULARY CPAP set on 11      . omeprazole (PRILOSEC) 40 MG capsule Take 40 mg by mouth daily.      . rosuvastatin (CRESTOR) 20 MG tablet Take 20 mg by mouth daily.       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  PHYSICAL EXAM: BP 140/88  Pulse 72  Resp 12  Ht 5' 11" (1.803 m)  Wt 276 lb (125.193 kg)  BMI 38.49 kg/m2  General: Obese WN WM who is alert and generally healthy appearing.  HEENT: Normal. Pupils equal. Good dentition. Neck: Supple. No mass.  No thyroid mass.   Lymph Nodes:  No supraclavicular or cervical nodes. Lungs: Clear to auscultation and symmetric breath sounds. Heart:  RRR. No murmur or rub.  Abdomen: Soft. No mass. No tenderness. Normal bowel sounds.  Umbilical hernia, approx 3+cm mass.  Lap band port in RUQ. Rectal: Not done. Extremities:  Good strength and ROM  in upper and lower extremities. Neurologic:  Grossly intact to motor and sensory function. Psychiatric: Has normal mood and affect. Behavior is normal.   Procedure:  I  accessed his lap band.  There is pressure in the band which pushes back 1.0 cc. He has about 9.9 cc that I measure in the lap band.   I added 0.2 cc of fluid for a total of 10.1 cc.  DATA REVIEWED: Reviewed chart.  Damante Spragg, MD, FACS Office:  [phone]  

## 2012-02-28 HISTORY — PX: TOOTH EXTRACTION: SUR596

## 2012-03-06 ENCOUNTER — Ambulatory Visit (INDEPENDENT_AMBULATORY_CARE_PROVIDER_SITE_OTHER): Payer: 59 | Admitting: Physician Assistant

## 2012-03-06 ENCOUNTER — Encounter (INDEPENDENT_AMBULATORY_CARE_PROVIDER_SITE_OTHER): Payer: Self-pay

## 2012-03-06 VITALS — BP 134/90 | HR 82 | Ht 71.0 in | Wt 268.2 lb

## 2012-03-06 DIAGNOSIS — Z4651 Encounter for fitting and adjustment of gastric lap band: Secondary | ICD-10-CM

## 2012-03-06 NOTE — Progress Notes (Signed)
  HISTORY: William Booth is a 61 y.o.male who received an AP-Large lap-band in October 2007 by Dr. Ezzard Standing. He comes in 2 weeks after an adjustment with one week of persistent regurgitation and borderline intolerance of liquids which began after a dental procedure.  VITAL SIGNS: Filed Vitals:   03/06/12 1257  BP: 134/90  Pulse: 82    PHYSICAL EXAM: Physical exam reveals a very well-appearing 61 y.o.male in no apparent distress Neurologic: Awake, alert, oriented Psych: Bright affect, conversant Respiratory: Breathing even and unlabored. No stridor or wheezing Abdomen: Soft, nontender, nondistended to palpation. Incisions well-healed. No incisional hernias. Port easily palpated. Extremities: Atraumatic, good range of motion.  ASSESMENT: 61 y.o.  male  s/p AP-Large lap-band.   PLAN: The patient's port was accessed with a 20G Huber needle without difficulty. Clear fluid was aspirated and 1.2 mL saline was removed from the port to give a total predicted volume of 8.9 mL. The patient was able to swallow water without difficulty following the procedure. We'll have him follow up with Dr. Ezzard Standing following his herniorraphy.

## 2012-03-06 NOTE — Patient Instructions (Signed)
Return to see Dr. Newman as scheduled. Focus on good food choices as well as physical activity. Return sooner if you have an increase in hunger, portion sizes or weight. Return also for difficulty swallowing, night cough, reflux.   

## 2012-03-14 ENCOUNTER — Encounter (HOSPITAL_COMMUNITY): Payer: Self-pay

## 2012-03-14 ENCOUNTER — Ambulatory Visit (HOSPITAL_COMMUNITY)
Admission: RE | Admit: 2012-03-14 | Discharge: 2012-03-14 | Disposition: A | Discharge status: Home / Self Care | Payer: 59 | Source: Ambulatory Visit | Attending: Surgery | Admitting: Surgery

## 2012-03-14 ENCOUNTER — Encounter (HOSPITAL_COMMUNITY): Payer: Self-pay | Admitting: Pharmacy Technician

## 2012-03-14 ENCOUNTER — Ambulatory Visit (HOSPITAL_COMMUNITY)
Admission: RE | Admit: 2012-03-14 | Discharge: 2012-03-14 | Disposition: A | Discharge status: Home / Self Care | Payer: 59 | Source: Ambulatory Visit | Attending: Anesthesiology | Admitting: Anesthesiology

## 2012-03-14 DIAGNOSIS — Z01812 Encounter for preprocedural laboratory examination: Secondary | ICD-10-CM | POA: Insufficient documentation

## 2012-03-14 DIAGNOSIS — Z01810 Encounter for preprocedural cardiovascular examination: Secondary | ICD-10-CM | POA: Insufficient documentation

## 2012-03-14 DIAGNOSIS — Z01818 Encounter for other preprocedural examination: Secondary | ICD-10-CM | POA: Insufficient documentation

## 2012-03-14 HISTORY — DX: Umbilical hernia without obstruction or gangrene: K42.9

## 2012-03-14 HISTORY — DX: Gastro-esophageal reflux disease without esophagitis: K21.9

## 2012-03-14 HISTORY — DX: Polyneuropathy, unspecified: G62.9

## 2012-03-14 HISTORY — DX: Lipoprotein deficiency: E78.6

## 2012-03-14 HISTORY — DX: Sleep apnea, unspecified: G47.30

## 2012-03-14 HISTORY — DX: Narcolepsy without cataplexy: G47.419

## 2012-03-14 HISTORY — DX: Anxiety disorder, unspecified: F41.9

## 2012-03-14 LAB — CBC
MCH: 30.2 pg (ref 26.0–34.0)
MCHC: 34.3 g/dL (ref 30.0–36.0)
Platelets: 224 10*3/uL (ref 150–400)
RBC: 4.74 MIL/uL (ref 4.22–5.81)
RDW: 13.9 % (ref 11.5–15.5)

## 2012-03-14 LAB — BASIC METABOLIC PANEL
BUN: 9 mg/dL (ref 6–23)
Calcium: 9.1 mg/dL (ref 8.4–10.5)
Creatinine, Ser: 1.27 mg/dL (ref 0.50–1.35)
GFR calc non Af Amer: 60 mL/min — ABNORMAL LOW (ref >90)
Glucose, Bld: 131 mg/dL — ABNORMAL HIGH (ref 70–99)
Sodium: 140 mEq/L (ref 135–145)

## 2012-03-14 NOTE — Pre-Procedure Instructions (Signed)
20 William Booth  03/14/2012   Your procedure is scheduled on:  Thursday November 21  Report to [facility] at 5:30 AM.  Call this number if you have problems the morning of surgery: [phone]   Remember:   Do not eat or drink:After Midnight.    Take these medicines the morning of surgery with A SIP OF WATER: Atenolol, Fluoxetine, Omeprazole. May take Alprazolam and  May use Flonase.    Do not wear jewelry, make-up or nail polish.  Do not wear lotions, powders, or perfumes. You may wear deodorant.  Do not shave 48 hours prior to surgery. Men may shave face and neck.  Do not bring valuables to the hospital.  Contacts, dentures or bridgework may not be worn into surgery.  Leave suitcase in the car. After surgery it may be brought to your room.  For patients admitted to the hospital, checkout time is 11:00 AM the day of discharge.   Patients discharged the day of surgery will not be allowed to drive home.    Special Instructions: Shower using CHG 2 nights before surgery and the night before surgery.  If you shower the day of surgery use CHG.  Use special wash - you have one bottle of CHG for all showers.  You should use approximately 1/3 of the bottle for each shower.   Please read over the following fact sheets that you were given: Pain Booklet, Coughing and Deep Breathing and Surgical Site Infection Prevention

## 2012-03-19 MED ORDER — DEXTROSE 5 % IV SOLN
3.0000 g | INTRAVENOUS | Status: AC
  Administered 2012-03-20: 3 g via INTRAVENOUS
  Filled 2012-03-19: qty 3000

## 2012-03-20 ENCOUNTER — Ambulatory Visit (HOSPITAL_COMMUNITY)
Admission: RE | Admit: 2012-03-20 | Discharge: 2012-03-20 | Disposition: A | Discharge status: Home / Self Care | Payer: 59 | Source: Ambulatory Visit | Attending: Surgery | Admitting: Surgery

## 2012-03-20 ENCOUNTER — Encounter (HOSPITAL_COMMUNITY)
Admission: RE | Disposition: A | Discharge status: Home / Self Care | Payer: Self-pay | Source: Ambulatory Visit | Attending: Surgery

## 2012-03-20 ENCOUNTER — Encounter (HOSPITAL_COMMUNITY): Payer: Self-pay | Admitting: Anesthesiology

## 2012-03-20 ENCOUNTER — Encounter (HOSPITAL_COMMUNITY): Payer: Self-pay | Admitting: *Deleted

## 2012-03-20 ENCOUNTER — Ambulatory Visit (HOSPITAL_COMMUNITY): Payer: 59 | Admitting: Anesthesiology

## 2012-03-20 DIAGNOSIS — F411 Generalized anxiety disorder: Secondary | ICD-10-CM | POA: Insufficient documentation

## 2012-03-20 DIAGNOSIS — K429 Umbilical hernia without obstruction or gangrene: Secondary | ICD-10-CM

## 2012-03-20 DIAGNOSIS — Z79899 Other long term (current) drug therapy: Secondary | ICD-10-CM | POA: Insufficient documentation

## 2012-03-20 DIAGNOSIS — E119 Type 2 diabetes mellitus without complications: Secondary | ICD-10-CM | POA: Insufficient documentation

## 2012-03-20 DIAGNOSIS — Z7982 Long term (current) use of aspirin: Secondary | ICD-10-CM | POA: Insufficient documentation

## 2012-03-20 DIAGNOSIS — K420 Umbilical hernia with obstruction, without gangrene: Secondary | ICD-10-CM

## 2012-03-20 DIAGNOSIS — I10 Essential (primary) hypertension: Secondary | ICD-10-CM | POA: Insufficient documentation

## 2012-03-20 DIAGNOSIS — G4733 Obstructive sleep apnea (adult) (pediatric): Secondary | ICD-10-CM | POA: Insufficient documentation

## 2012-03-20 DIAGNOSIS — Z9884 Bariatric surgery status: Secondary | ICD-10-CM | POA: Insufficient documentation

## 2012-03-20 HISTORY — PX: UMBILICAL HERNIA REPAIR: SHX196

## 2012-03-20 LAB — GLUCOSE, CAPILLARY: Glucose-Capillary: 112 mg/dL — ABNORMAL HIGH (ref 70–99)

## 2012-03-20 SURGERY — REPAIR, HERNIA, UMBILICAL, LAPAROSCOPIC
Anesthesia: General | Site: Abdomen | Wound class: Clean

## 2012-03-20 MED ORDER — CHLORHEXIDINE GLUCONATE 4 % EX LIQD: 1.0000 "application " | Freq: Once | CUTANEOUS | Status: DC

## 2012-03-20 MED ORDER — OXYCODONE HCL 5 MG/5ML PO SOLN: 5.0000 mg | Freq: Once | ORAL | Status: DC | PRN

## 2012-03-20 MED ORDER — DEXAMETHASONE SODIUM PHOSPHATE 4 MG/ML IJ SOLN
INTRAMUSCULAR | Status: DC | PRN
  Administered 2012-03-20: 4 mg via INTRAVENOUS

## 2012-03-20 MED ORDER — HYDROMORPHONE HCL PF 1 MG/ML IJ SOLN
0.2500 mg | INTRAMUSCULAR | Status: DC | PRN
  Administered 2012-03-20 (×4): 0.5 mg via INTRAVENOUS

## 2012-03-20 MED ORDER — OXYCODONE HCL 5 MG PO TABS: 5.0000 mg | ORAL_TABLET | Freq: Once | ORAL | Status: DC | PRN

## 2012-03-20 MED ORDER — MIDAZOLAM HCL 5 MG/5ML IJ SOLN
INTRAMUSCULAR | Status: DC | PRN
  Administered 2012-03-20: 2 mg via INTRAVENOUS

## 2012-03-20 MED ORDER — NEOSTIGMINE METHYLSULFATE 1 MG/ML IJ SOLN
INTRAMUSCULAR | Status: DC | PRN
  Administered 2012-03-20: 4 mg via INTRAVENOUS

## 2012-03-20 MED ORDER — LIDOCAINE HCL (CARDIAC) 20 MG/ML IV SOLN
INTRAVENOUS | Status: DC | PRN
  Administered 2012-03-20: 100 mg via INTRAVENOUS

## 2012-03-20 MED ORDER — METOCLOPRAMIDE HCL 5 MG/ML IJ SOLN: 10.0000 mg | Freq: Once | INTRAMUSCULAR | Status: DC | PRN

## 2012-03-20 MED ORDER — OXYCODONE HCL 5 MG PO TABS: 5.0000 mg | ORAL_TABLET | Freq: Four times a day (QID) | ORAL | Status: DC | PRN

## 2012-03-20 MED ORDER — FENTANYL CITRATE 0.05 MG/ML IJ SOLN
INTRAMUSCULAR | Status: DC | PRN
  Administered 2012-03-20 (×3): 50 ug via INTRAVENOUS
  Administered 2012-03-20: 100 ug via INTRAVENOUS

## 2012-03-20 MED ORDER — HYDROMORPHONE HCL PF 1 MG/ML IJ SOLN
INTRAMUSCULAR | Status: AC
  Filled 2012-03-20: qty 1

## 2012-03-20 MED ORDER — LACTATED RINGERS IV SOLN
INTRAVENOUS | Status: DC | PRN
  Administered 2012-03-20 (×2): via INTRAVENOUS

## 2012-03-20 MED ORDER — GLYCOPYRROLATE 0.2 MG/ML IJ SOLN
INTRAMUSCULAR | Status: DC | PRN
  Administered 2012-03-20: 0.2 mg via INTRAVENOUS
  Administered 2012-03-20: 0.6 mg via INTRAVENOUS

## 2012-03-20 MED ORDER — PROPOFOL 10 MG/ML IV BOLUS
INTRAVENOUS | Status: DC | PRN
  Administered 2012-03-20: 200 mg via INTRAVENOUS

## 2012-03-20 MED ORDER — 0.9 % SODIUM CHLORIDE (POUR BTL) OPTIME
TOPICAL | Status: DC | PRN
  Administered 2012-03-20: 1000 mL

## 2012-03-20 MED ORDER — BUPIVACAINE HCL (PF) 0.25 % IJ SOLN
INTRAMUSCULAR | Status: AC
  Filled 2012-03-20: qty 30

## 2012-03-20 MED ORDER — ONDANSETRON HCL 4 MG/2ML IJ SOLN
INTRAMUSCULAR | Status: DC | PRN
  Administered 2012-03-20: 4 mg via INTRAVENOUS

## 2012-03-20 MED ORDER — ROCURONIUM BROMIDE 100 MG/10ML IV SOLN
INTRAVENOUS | Status: DC | PRN
  Administered 2012-03-20: 50 mg via INTRAVENOUS

## 2012-03-20 MED ORDER — FENTANYL CITRATE 0.05 MG/ML IJ SOLN: INTRAMUSCULAR | Status: DC | PRN

## 2012-03-20 MED ORDER — ARTIFICIAL TEARS OP OINT
TOPICAL_OINTMENT | OPHTHALMIC | Status: DC | PRN
  Administered 2012-03-20: 1 via OPHTHALMIC

## 2012-03-20 SURGICAL SUPPLY — 48 items
APPLIER CLIP ROT 10 11.4 M/L (STAPLE)
BENZOIN TINCTURE PRP APPL 2/3 (GAUZE/BANDAGES/DRESSINGS) ×2 IMPLANT
BLADE SURG ROTATE 9660 (MISCELLANEOUS) ×2 IMPLANT
CANISTER SUCTION 2500CC (MISCELLANEOUS) IMPLANT
CLIP APPLIE ROT 10 11.4 M/L (STAPLE) IMPLANT
CLOTH BEACON ORANGE TIMEOUT ST (SAFETY) ×2 IMPLANT
COVER SURGICAL LIGHT HANDLE (MISCELLANEOUS) ×2 IMPLANT
DEVICE SECURE STRAP 25 ABSORB (INSTRUMENTS) ×2 IMPLANT
DEVICE TROCAR PUNCTURE CLOSURE (ENDOMECHANICALS) ×2 IMPLANT
DRAPE INCISE IOBAN 66X45 STRL (DRAPES) ×2 IMPLANT
DRAPE UTILITY 15X26 W/TAPE STR (DRAPE) ×4 IMPLANT
ELECT REM PT RETURN 9FT ADLT (ELECTROSURGICAL) ×2
ELECTRODE REM PT RTRN 9FT ADLT (ELECTROSURGICAL) ×1 IMPLANT
ENDOLOOP SUT PDS II  0 18 (SUTURE)
ENDOLOOP SUT PDS II 0 18 (SUTURE) IMPLANT
GLOVE BIO SURGEON STRL SZ7 (GLOVE) ×6 IMPLANT
GLOVE BIOGEL PI IND STRL 7.0 (GLOVE) ×1 IMPLANT
GLOVE BIOGEL PI IND STRL 7.5 (GLOVE) ×1 IMPLANT
GLOVE BIOGEL PI INDICATOR 7.0 (GLOVE) ×1
GLOVE BIOGEL PI INDICATOR 7.5 (GLOVE) ×1
GLOVE SURG SIGNA 7.5 PF LTX (GLOVE) ×2 IMPLANT
GLOVE SURG SS PI 7.0 STRL IVOR (GLOVE) ×2 IMPLANT
GOWN STRL NON-REIN LRG LVL3 (GOWN DISPOSABLE) ×4 IMPLANT
GOWN STRL REIN XL XLG (GOWN DISPOSABLE) ×2 IMPLANT
KIT BASIN OR (CUSTOM PROCEDURE TRAY) ×2 IMPLANT
KIT ROOM TURNOVER OR (KITS) ×2 IMPLANT
MESH PARIETEX 4.7 (Mesh General) ×2 IMPLANT
NEEDLE SPNL 22GX3.5 QUINCKE BK (NEEDLE) ×2 IMPLANT
NS IRRIG 1000ML POUR BTL (IV SOLUTION) ×2 IMPLANT
PAD ARMBOARD 7.5X6 YLW CONV (MISCELLANEOUS) ×4 IMPLANT
POUCH SPECIMEN RETRIEVAL 10MM (ENDOMECHANICALS) IMPLANT
RELOAD 45 VASCULAR/THIN (ENDOMECHANICALS) IMPLANT
SCALPEL HARMONIC ACE (MISCELLANEOUS) IMPLANT
SET IRRIG TUBING LAPAROSCOPIC (IRRIGATION / IRRIGATOR) IMPLANT
SPECIMEN JAR SMALL (MISCELLANEOUS) ×2 IMPLANT
STAPLER VISISTAT 35W (STAPLE) ×2 IMPLANT
STRIP CLOSURE SKIN 1/2X4 (GAUZE/BANDAGES/DRESSINGS) ×2 IMPLANT
SUT NOVA 0 T19/GS 22DT (SUTURE) IMPLANT
SUT NOVA NAB GS-21 0 18 T12 DT (SUTURE) ×4 IMPLANT
SUT VIC AB 5-0 PS2 18 (SUTURE) ×2 IMPLANT
SUT VICRYL 0 UR6 27IN ABS (SUTURE) IMPLANT
TOWEL OR 17X24 6PK STRL BLUE (TOWEL DISPOSABLE) ×2 IMPLANT
TOWEL OR 17X26 10 PK STRL BLUE (TOWEL DISPOSABLE) ×2 IMPLANT
TRAY FOLEY CATH 14FRSI W/METER (CATHETERS) IMPLANT
TRAY LAPAROSCOPIC (CUSTOM PROCEDURE TRAY) ×2 IMPLANT
TROCAR XCEL NON-BLD 11X100MML (ENDOMECHANICALS) ×2 IMPLANT
TROCAR Z-THREAD FIOS 5X100MM (TROCAR) ×2 IMPLANT
WATER STERILE IRR 1000ML POUR (IV SOLUTION) IMPLANT

## 2012-03-20 NOTE — Anesthesia Procedure Notes (Signed)
Procedure Name: Intubation Date/Time: 03/20/2012 7:36 AM Performed by: Fransisca Kaufmann Pre-anesthesia Checklist: Patient identified, Emergency Drugs available, Suction available, Patient being monitored and Timeout performed Patient Re-evaluated:Patient Re-evaluated prior to inductionOxygen Delivery Method: Circle system utilized Preoxygenation: Pre-oxygenation with 100% oxygen Intubation Type: IV induction Ventilation: Two handed mask ventilation required Laryngoscope Size: Miller and 3 Grade View: Grade I Tube type: Oral Number of attempts: 1 Airway Equipment and Method: Stylet Placement Confirmation: ETT inserted through vocal cords under direct vision,  breath sounds checked- equal and bilateral and positive ETCO2 Secured at: 23 cm Tube secured with: Tape Dental Injury: Teeth and Oropharynx as per pre-operative assessment

## 2012-03-20 NOTE — Interval H&P Note (Signed)
History and Physical Interval Note:  03/20/2012 7:15 AM  William Booth  has presented today for surgery, with the diagnosis of umbilical hernia  The various methods of treatment have been discussed with the patient and family.  Wife here with patient.  Since I saw him, he had a molar extracted, then his lap band was too tight and he required fluid removed by Mardelle Matte.  He is doing well now.  After consideration of risks, benefits and other options for treatment, the patient has consented to  Procedure(s) (LRB) with comments: LAPAROSCOPIC UMBILICAL HERNIA (N/A) as a surgical intervention .    The patient's history has been reviewed, patient examined, no change in status, stable for surgery.  I have reviewed the patient's chart and labs.  Questions were answered to the patient's satisfaction.     Verl Whitmore H

## 2012-03-20 NOTE — Anesthesia Postprocedure Evaluation (Signed)
Anesthesia Post Note  Patient: William Booth  Procedure(s) Performed: Procedure(s) (LRB): LAPAROSCOPIC UMBILICAL HERNIA (N/A)  Anesthesia type: MAC  Patient location: PACU  Post pain: Pain level controlled  Post assessment: Patient's Cardiovascular Status Stable  Last Vitals:  Filed Vitals:   03/20/12 1018  BP:   Pulse: 51  Temp:   Resp: 15    Post vital signs: Reviewed and stable  Level of consciousness: alert  Complications: No apparent anesthesia complications

## 2012-03-20 NOTE — Anesthesia Preprocedure Evaluation (Signed)
Anesthesia Evaluation  Patient identified by MRN, date of birth, ID band Patient awake    Reviewed: Allergy & Precautions, H&P , NPO status , Patient's Chart, lab work & pertinent test results, reviewed documented beta blocker date and time   Airway Mallampati: II TM Distance: >3 FB Neck ROM: full    Dental   Pulmonary sleep apnea and Continuous Positive Airway Pressure Ventilation ,  breath sounds clear to auscultation        Cardiovascular hypertension, On Medications Rhythm:regular     Neuro/Psych  Neuromuscular disease negative psych ROS   GI/Hepatic GERD-  Medicated and Controlled,(+) Hepatitis -  Endo/Other  diabetes, Oral Hypoglycemic AgentsMorbid obesity  Renal/GU negative Renal ROS  negative genitourinary   Musculoskeletal   Abdominal   Peds  Hematology negative hematology ROS (+)   Anesthesia Other Findings See surgeon's H&P   Reproductive/Obstetrics negative OB ROS                           Anesthesia Physical Anesthesia Plan  ASA: III  Anesthesia Plan: General   Post-op Pain Management:    Induction: Intravenous  Airway Management Planned: Oral ETT  Additional Equipment:   Intra-op Plan:   Post-operative Plan: Extubation in OR  Informed Consent: I have reviewed the patients History and Physical, chart, labs and discussed the procedure including the risks, benefits and alternatives for the proposed anesthesia with the patient or authorized representative who has indicated his/her understanding and acceptance.   Dental Advisory Given  Plan Discussed with: CRNA and Surgeon  Anesthesia Plan Comments:         Anesthesia Quick Evaluation

## 2012-03-20 NOTE — Preoperative (Signed)
Beta Blockers   Reason not to administer Beta Blockers:Not Applicable 

## 2012-03-20 NOTE — H&P (View-Only) (Signed)
[facility]  Ovidio Kin, MD,  FACS [address] [place]  [address] [place], [place]    [address] Phone:  [phone] FAX:  [phone]   Re:   William Booth DOB:   1950/12/21 MRN:   [id number]  ASSESSMENT AND PLAN: 1.  History of lap band, AP Large - 02/18/2006  Initial weight - 348, BMI - 49.6  Still he thinks that his lap band is too loose.  I added 0.2 cc today - total approx 10.1 cc.  2. Umbilical hernia.  The hernia has enlarged and is bothering him.  I discussed the indications and complications of hernia surgery with the patient.  I discussed both the laparoscopic and open approach to hernia repair..  The potential risks of hernia surgery include, but are not limited to, bleeding, infection, open surgery, nerve injury, and recurrence of the hernia.  I provided the patient literature about hernia surgery.  3.  DM 4.  HTN  Doing better with this. 5.  OSA  Sees Dr. Melba Coon.  On CPAP. 6.  Anxiety   HISTORY OF PRESENT ILLNESS: Chief Complaint  Patient presents with  . Lap Band Fill  . Incisional Hernia    William Booth is a 61 y.o. (DOB: April 11, 1951)  white male who is a patient of Mickie Hillier, MD and comes to me today for follow up of lap band and to discuss repair of umbilical hernia.  He still thinks that he needs a small adjustment for the lap band.  He is walking about 1/2 mile BID.  I encouraged him to get up to 3 miles per day.  Secondly, I talked about adding solid food to his breakfast.  We discussed the "green" zone for the lap band and he expressed an understanding of what we are looking for in eating.  Then we talked about his umibilcal hernia.  He has noticed this about one year and it has become larger.  It is bothering him and has enlarged.  Current Outpatient Prescriptions  Medication Sig Dispense Refill  . ACCU-CHEK FASTCLIX LANCETS MISC       . ACCU-CHEK SMARTVIEW test strip       . ALPRAZolam (XANAX) 0.5 MG tablet Ad  lib.      Marland Kitchen amitriptyline (ELAVIL) 100 MG tablet Ad lib.      . Armodafinil 150 MG tablet Take 150 mg by mouth daily.      Marland Kitchen aspirin 81 MG tablet Take 81 mg by mouth daily.        Marland Kitchen atenolol (TENORMIN) 50 MG tablet Take 50 mg by mouth 2 (two) times daily.      . fenofibrate 160 MG tablet Take 160 mg by mouth daily.      Marland Kitchen FLUoxetine (PROZAC) 40 MG capsule Take 1 tablet by mouth daily.      . fluticasone (FLONASE) 50 MCG/ACT nasal spray Ad lib.      Marland Kitchen glimepiride (AMARYL) 4 MG tablet Take 4 mg by mouth 2 (two) times daily.      Marland Kitchen losartan-hydrochlorothiazide (HYZAAR) 100-25 MG per tablet Take 1 tablet by mouth daily.        . metFORMIN (GLUCOPHAGE) 1000 MG tablet Take 1,000 mg by mouth 2 (two) times daily with a meal.        . modafinil (PROVIGIL) 200 MG tablet Take 1 tablet (200 mg total) by mouth daily.  90 tablet  0  . niacin (NIASPAN) 500 MG CR tablet Take 500  mg by mouth at bedtime.      . NON FORMULARY CPAP set on 11      . omeprazole (PRILOSEC) 40 MG capsule Take 40 mg by mouth daily.      . rosuvastatin (CRESTOR) 20 MG tablet Take 20 mg by mouth daily.       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  PHYSICAL EXAM: BP 140/88  Pulse 72  Resp 12  Ht 5\' 11"  (1.803 m)  Wt 276 lb (125.193 kg)  BMI 38.49 kg/m2  General: Obese WN WM who is alert and generally healthy appearing.  HEENT: Normal. Pupils equal. Good dentition. Neck: Supple. No mass.  No thyroid mass.   Lymph Nodes:  No supraclavicular or cervical nodes. Lungs: Clear to auscultation and symmetric breath sounds. Heart:  RRR. No murmur or rub.  Abdomen: Soft. No mass. No tenderness. Normal bowel sounds.  Umbilical hernia, approx 3+cm mass.  Lap band port in RUQ. Rectal: Not done. Extremities:  Good strength and ROM  in upper and lower extremities. Neurologic:  Grossly intact to motor and sensory function. Psychiatric: Has normal mood and affect. Behavior is normal.   Procedure:  I  accessed his lap band.  There is pressure in the band which pushes back 1.0 cc. He has about 9.9 cc that I measure in the lap band.   I added 0.2 cc of fluid for a total of 10.1 cc.  DATA REVIEWED: Reviewed chart.  Ovidio Kin, MD, FACS Office:  [phone]

## 2012-03-20 NOTE — Op Note (Signed)
03/20/2012  8:40 AM  PATIENT:  William Booth, 61 y.o., male, MRN: [id number]  PREOP DIAGNOSIS:  umbilical hernia  POSTOP DIAGNOSIS:   Incarcerated umbilical hernia (with omentum), 3 cm  PROCEDURE:   Procedure(s): LAPAROSCOPIC UMBILICAL HERNIA, 12 cm Parietex mesh  SURGEON:   Ovidio Kin, M.D.  ASSISTANT:   None  ANESTHESIA:   general  Hart Robinsons, MD - Anesthesiologist  General  EBL:  Minimal  ml  BLOOD ADMINISTERED: none  DRAINS: none   LOCAL MEDICATIONS USED:   20 cc 1/4% marcaine    SPECIMEN:   None  COUNTS CORRECT:  YES  INDICATIONS FOR PROCEDURE:  William Booth is a 61 y.o. (DOB: [birth year]) white male whose primary care physician is Mickie Hillier, MD and comes for laparoscopic repair of umbilical hernia.   The indications and risks of the surgery were explained to the patient.  The risks include, but are not limited to, infection, bleeding, and nerve injury.  OPERATIVE NOTE:  The patient was taken to room 8 at [facility] OR.  He underwent a general anesthesia.  He was given 2 grams of Ancef at the beginning of the operation.   A time out was held and the surgical checklist run.   The abdomen was prepped with THC and sterilely draped.  I covered the abdomen with a Ioban drape.   I accessed the LUQ with a 10 mm trocar.  An additional 5 mm trocar was placed in the left mid abdomen.   He had a 3 cm defect at the umbilicus.  There was some omentum incarcerated in the defect.   Then I placed a 12 cm round Parietex mesh in the abdomen.  It had 8 holding sutures of 0 Novafil.  These were tied down to secure the mesh to the anterior abdominal wall.  I then used 25 Securestrap tacks to tack the mesh to the anterior abdominal wall.   The abdomen was deflated.  The mesh was inspected and there were no defects around the edge.   The trocars were then removed.  There was no bleeding at the trocar sites.  The incisions were closed with 5-0 Monocryl and painted  with Dermabond. The puncture sites were closed with Steristrips.   The sponge and needle count were correct.  The patient was transferred to the recovery room in good condition.  My plan is to discharge the patient today.   Type of repair - Mesh  (choices - primary suture, mesh, or component)  Name of mesh - Parietex, 12 cm  Size of mesh - Length 12 cm, Width 12 cm  Mesh overlap - 5 cm  Placement of mesh - beneath fascia and into peritoneal cavity  (choices - beneath fascia and into peritoneal cavity, beneath fascia but external to peritoneal cavity, between the muscle and fascia, above or external to fascia)  Ovidio Kin, MD, [facility] Pager: [phone] Office phone:  [phone]

## 2012-03-20 NOTE — Progress Notes (Signed)
cbg 134

## 2012-03-20 NOTE — Transfer of Care (Signed)
Immediate Anesthesia Transfer of Care Note  Patient: William Booth  Procedure(s) Performed: Procedure(s) (LRB) with comments: LAPAROSCOPIC UMBILICAL HERNIA (N/A)  Patient Location: PACU  Anesthesia Type:General  Level of Consciousness: awake, alert , oriented and sedated  Airway & Oxygen Therapy: Patient Spontanous Breathing and Patient connected to face mask oxygen  Post-op Assessment: Report given to PACU RN, Post -op Vital signs reviewed and stable and Patient moving all extremities  Post vital signs: Reviewed and stable  Complications: No apparent anesthesia complications

## 2012-03-21 ENCOUNTER — Encounter (HOSPITAL_COMMUNITY): Payer: Self-pay | Admitting: Surgery

## 2012-03-25 ENCOUNTER — Other Ambulatory Visit (INDEPENDENT_AMBULATORY_CARE_PROVIDER_SITE_OTHER): Payer: Self-pay

## 2012-03-25 DIAGNOSIS — G8918 Other acute postprocedural pain: Secondary | ICD-10-CM

## 2012-03-25 MED ORDER — OXYCODONE HCL 5 MG PO TABS: 5.0000 mg | ORAL_TABLET | Freq: Four times a day (QID) | ORAL | Status: DC | PRN

## 2012-03-25 NOTE — Telephone Encounter (Signed)
Patient calling into office requesting a refill on Oxycodone.  Patient states he has an allergy to NSAIDS & Acetaminophen.  Patient status post Umbilical Hernia Repair 03/20/2012.  Patient is aware that Dr. Ezzard Standing will not available this week.  Will forward request to our Urgent Office Physician for review.

## 2012-03-25 NOTE — Telephone Encounter (Signed)
Patient advised prescription for Oxycodone will be available for pick up at the front desk.

## 2012-04-02 ENCOUNTER — Encounter (INDEPENDENT_AMBULATORY_CARE_PROVIDER_SITE_OTHER): Payer: Self-pay | Admitting: Surgery

## 2012-04-02 ENCOUNTER — Ambulatory Visit (INDEPENDENT_AMBULATORY_CARE_PROVIDER_SITE_OTHER): Payer: 59 | Admitting: Surgery

## 2012-04-02 VITALS — BP 132/74 | HR 76 | Temp 97.6°F | Resp 18 | Ht 71.0 in | Wt 273.0 lb

## 2012-04-02 DIAGNOSIS — K429 Umbilical hernia without obstruction or gangrene: Secondary | ICD-10-CM

## 2012-04-02 NOTE — Progress Notes (Signed)
[facility]  Ovidio Kin, MD,  FACS [address] [place]  [address] [place], [place]    [address] Phone:  [phone] FAX:  [phone]   Re:   William Booth DOB:   1950-08-08 MRN:   [id number]  ASSESSMENT AND PLAN: 1.  History of lap band, AP Large - 02/18/2006  Initial weight - 348, BMI - 49.6  He needed fluid removed after my last fill.  Mardelle Matte took the fluid out.  HIs predicted volume is 8.9cc.  Still he thinks that his lap band is too loose, but we now know a cap of fluid he can take. [I gave him a prescription for Oxycodone 5 mg, #30, no refills]  2. Umbilical hernia.  Post op visit - hernia repaired laparoscopically - 03/20/2012  3.  DM 4.  HTN  Doing better with this. 5.  OSA  Sees Dr. Melba Coon.  On CPAP. 6.  Anxiety 7.  For colonoscopy by Dr.Buccini in about one week.   HISTORY OF PRESENT ILLNESS: Chief Complaint  Patient presents with  . Routine Post Op    Umbilical hernia repair 03/20/12   William Booth is a 61 y.o. (DOB: 07-30-1950)  white male who is a patient of Mickie Hillier, MD and comes to me today for follow up for umbilical hernia repair.  He is doing well, though sore.  He also thinks that he will need more fluid in his ban, but we will address that when he is over his post op pain and over the holidays.   Current Outpatient Prescriptions  Medication Sig Dispense Refill  . ALPRAZolam (XANAX) 0.5 MG tablet Take 0.5 mg by mouth 2 (two) times daily as needed. For anxiety      . amitriptyline (ELAVIL) 100 MG tablet Take 100 mg by mouth at bedtime as needed. For sleep      . aspirin 81 MG tablet Take 81 mg by mouth daily.        Marland Kitchen atenolol (TENORMIN) 50 MG tablet Take 50 mg by mouth 2 (two) times daily.      . fenofibrate 160 MG tablet Take 160 mg by mouth daily.      Marland Kitchen FLUoxetine (PROZAC) 40 MG capsule Take 1 tablet by mouth daily.      . fluticasone (FLONASE) 50 MCG/ACT nasal spray Place 1 spray into the nose daily.        Marland Kitchen glimepiride (AMARYL) 4 MG tablet Take 4 mg by mouth 2 (two) times daily.      Marland Kitchen losartan-hydrochlorothiazide (HYZAAR) 100-25 MG per tablet Take 1 tablet by mouth daily.        Marland Kitchen LYRICA 100 MG capsule Take 100 mg by mouth 3 (three) times daily.       . metFORMIN (GLUCOPHAGE) 1000 MG tablet Take 1,000 mg by mouth 2 (two) times daily with a meal.        . modafinil (PROVIGIL) 200 MG tablet Take 1 tablet (200 mg total) by mouth daily.  90 tablet  0  . niacin (NIASPAN) 500 MG CR tablet Take 500 mg by mouth at bedtime.      Marland Kitchen omeprazole (PRILOSEC) 40 MG capsule Take 40 mg by mouth daily.      Marland Kitchen oxyCODONE (OXY IR/ROXICODONE) 5 MG immediate release tablet Take 1-2 tablets (5-10 mg total) by mouth every 6 (six) hours as needed for pain.  30 tablet  0  . rosuvastatin (CRESTOR) 20 MG tablet Take 20 mg by mouth  daily.       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  PHYSICAL EXAM: BP 132/74  Pulse 76  Temp 97.6 F (36.4 C) (Temporal)  Resp 18  Ht 5\' 11"  (1.803 m)  Wt 273 lb (123.832 kg)  BMI 38.08 kg/m2  General: Obese WN WM who is alert and generally healthy appearing.  Abdomen: Incisions associated with the umbilical hernia repair look good.  He is still sore.  He is still wearing the abdominal binder.  DATA REVIEWED: Reviewed chart.  Ovidio Kin, MD, FACS Office:  [phone]

## 2012-04-04 ENCOUNTER — Encounter: Payer: Self-pay | Admitting: Internal Medicine

## 2012-04-04 ENCOUNTER — Ambulatory Visit (INDEPENDENT_AMBULATORY_CARE_PROVIDER_SITE_OTHER): Payer: 59 | Admitting: Internal Medicine

## 2012-04-04 VITALS — BP 126/64 | HR 72 | Ht 70.0 in | Wt 282.2 lb

## 2012-04-04 DIAGNOSIS — G4733 Obstructive sleep apnea (adult) (pediatric): Secondary | ICD-10-CM

## 2012-04-04 MED ORDER — MODAFINIL 200 MG PO TABS: 200.0000 mg | ORAL_TABLET | Freq: Every day | ORAL | Status: DC

## 2012-04-04 NOTE — Progress Notes (Signed)
12/03/11- 43 yoM never smoker formerly a patient of my old practice, now seeking to reestablish because of sleep apnea. PCP Dr Kirtland Bouchard. William Booth Complains of daytime sleepiness, falling asleep during meetings and with long driving. He has remained fully compliant with his CPAP 11, getting supplies on line. Bedtime midnight to 1:30 AM with sleep latency less than 15 minutes and not waking until he gets up between 9 and 10 AM. 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 Wife tells him he does not snore through his current CPAP, using nasal pillows mask. Occasionally uses Elavil 100 mg as a sleeping pill only when traveling. He has been very heavy but says he lost over 100 pounds in 4 years. Lab band surgery at 389 pounds, down now around 278 pounds. No ENT surgery. Medical problems include hypertension, diabetes, allergic rhinitis. GERD is controlled with omeprazole.  01/04/12- 25 yoM never smoker formerly a patient of my old practice, now seeking to reestablish because of sleep apnea. PCP Dr Kirtland Bouchard. Little CPAP 11 [place]. Machine changed out. Download is still pending. Sample Nuvigil 150 helped but was not quite strong enough for residual daytime sleepiness. We discussed medication alternatives, sleep hygiene, naps and expectations from optimal CPAP control.  04/04/12-61 yoM never smoker followed for OSA, hx gastric banding FOLLOWS FOR: wears CPAP 12/ supplies on-line every night for 8-9 hours and pressure of 12 working well for patient. Had umbilical hernia repair with mesh and lap band by Dr. Ezzard Standing. No respiratory problems during the surgery. CPAP now at 12/ Advanced with new machine Continues Provigil 200 mg daily when needed. Tolerating this well. Try to maintain good sleep hygiene and take occasional nap. Sleeps well.  ROS-see HPI Constitutional:   + weight loss, No-night sweats, fevers, chills,+ fatigue, lassitude. HEENT:   No-  headaches, difficulty swallowing,  tooth/dental problems, sore throat,       No-  sneezing, itching, ear ache, nasal congestion, post nasal drip,  CV:  No-   chest pain, orthopnea, PND, swelling in lower extremities, anasarca,  dizziness, palpitations Resp: No-   shortness of breath with exertion or at rest.              No-   productive cough,  No non-productive cough,  No- coughing up of blood.              No-   change in color of mucus.  No- wheezing.   Skin: No-   rash or lesions. GI:  No-   heartburn, indigestion, abdominal pain, nausea, vomiting,  GU: MS:  No-   joint pain or swelling.   Neuro-     nothing unusual Psych:  No- change in mood or affect. No depression or anxiety.  No memory loss.  OBJ- Physical Exam General- Alert, Oriented, Affect-appropriate, Distress- none acute, +remains overweight Skin- rash-none, lesions- none, excoriation- none Lymphadenopathy- none Head- atraumatic            Eyes- Gross vision intact, PERRLA, conjunctivae and secretions clear            Ears- Hearing, canals-normal            Nose- Clear, no-Septal dev, mucus, polyps, erosion, perforation             Throat- Mallampati III , mucosa clear , drainage- none, tonsils- atrophic Neck- flexible , trachea midline, no stridor , thyroid nl, carotid no bruit Chest - symmetrical excursion , unlabored  Heart/CV- RRR , no murmur , no gallop  , no rub, nl s1 s2                           - JVD- none , edema- none, stasis changes- none, varices- none           Lung- clear to P&A, wheeze- none, cough- none , dullness-none, rub- none           Chest wall-  Abd-  Br/ Gen/ Rectal- Not done, not indicated Extrem- cyanosis- none, clubbing, none, atrophy- none, strength- nl Neuro- grossly intact to observation

## 2012-04-04 NOTE — Patient Instructions (Addendum)
Refill script for Provigil  We can continue CPAP 12/ Advanced or on-line supplies  Please call as needed

## 2012-04-13 NOTE — Assessment & Plan Note (Signed)
CPAP 12/Advanced. Good compliance and control. Still needs supplemental Provigil which we discussed again.

## 2012-04-18 ENCOUNTER — Other Ambulatory Visit: Payer: Self-pay | Admitting: Gastroenterology

## 2012-08-27 ENCOUNTER — Ambulatory Visit (INDEPENDENT_AMBULATORY_CARE_PROVIDER_SITE_OTHER): Payer: 59 | Admitting: Surgery

## 2012-08-27 VITALS — BP 132/80 | HR 67 | Resp 18 | Ht 71.0 in | Wt 313.0 lb

## 2012-08-27 DIAGNOSIS — K429 Umbilical hernia without obstruction or gangrene: Secondary | ICD-10-CM

## 2012-08-27 DIAGNOSIS — Z9884 Bariatric surgery status: Secondary | ICD-10-CM

## 2012-08-27 NOTE — Progress Notes (Signed)
[facility]  Ovidio Kin, MD,  FACS [address] [place]  [address] [place], [place]    [address] Phone:  [phone] FAX:  [phone]   Re:   William Booth DOB:   09/28/1950 MRN:   [id number]  ASSESSMENT AND PLAN: 1.  History of lap band, AP Large - 02/18/2006  Initial weight - 348, BMI - 49.6  He had 8.5 cc in the lap band, I added 0.8 cc for a total of 9.3 cc.  He has gained 40 pounds since I last saw him in Dec 2014!  Even with no fluid in the lap band, this is a little excessive.    Anyway - I have adjusted him up and we'll see how he does.   Return for either me or Mardelle Matte in 6 weeks.  2. Umbilical hernia - repaired laparoscopically - 03/20/2012 - D. Theodora Lalanne  He's done well from this. 3.  DM 4.  HTN  Doing better with this. 5.  OSA  Saw Dr. Melba Coon 03/2012.  On CPAP. 6.  Anxiety  HISTORY OF PRESENT ILLNESS: Chief Complaint  Patient presents with  . Lap Band Fill    P/O hernia sx   William Booth is a 62 y.o. (DOB: Jan 18, 1951)  white male who is a patient of Mickie Hillier, MD and comes to me today for follow up for a lap band.  I fixed an abdominal wall hernia on him at the end of last year.  He has done well from that.  Around that time, his band was a little too tight, so I wanted the band loose around the time of surgery.  Unfortunately, he has put on 40 pounds since I last saw him.  His lap band was not that loose, so other than badly overeating, he should not have gained that much weight. We talked about diet.  He is not doing much exercise, which I think is acomponent of his rapid weigh gain.  Current Outpatient Prescriptions  Medication Sig Dispense Refill  . ALPRAZolam (XANAX) 0.5 MG tablet Take 0.5 mg by mouth 2 (two) times daily as needed. For anxiety      . amitriptyline (ELAVIL) 100 MG tablet Take 100 mg by mouth at bedtime as needed. For sleep      . aspirin 81 MG tablet Take 81 mg by mouth daily.        Marland Kitchen atenolol (TENORMIN) 50  MG tablet Take 50 mg by mouth 2 (two) times daily.      . fenofibrate 160 MG tablet Take 160 mg by mouth daily.      Marland Kitchen FLUoxetine (PROZAC) 40 MG capsule Take 1 tablet by mouth daily.      . fluticasone (FLONASE) 50 MCG/ACT nasal spray Place 1 spray into the nose daily.       Marland Kitchen glimepiride (AMARYL) 4 MG tablet Take 4 mg by mouth 2 (two) times daily.      . hyoscyamine (LEVSIN SL) 0.125 MG SL tablet       . losartan-hydrochlorothiazide (HYZAAR) 100-25 MG per tablet Take 1 tablet by mouth daily.        Marland Kitchen LYRICA 100 MG capsule Take 100 mg by mouth 3 (three) times daily.       . metFORMIN (GLUCOPHAGE) 1000 MG tablet Take 1,000 mg by mouth 2 (two) times daily with a meal.        . niacin (NIASPAN) 500 MG CR tablet Take 500 mg by mouth at  bedtime.      Marland Kitchen omeprazole (PRILOSEC) 40 MG capsule Take 40 mg by mouth daily.      . rosuvastatin (CRESTOR) 20 MG tablet Take 20 mg by mouth daily.      . modafinil (PROVIGIL) 200 MG tablet Take 1 tablet (200 mg total) by mouth daily.  90 tablet  3  . oxyCODONE (OXY IR/ROXICODONE) 5 MG immediate release tablet Take 1-2 tablets (5-10 mg total) by mouth every 6 (six) hours as needed for pain.  30 tablet  0  . promethazine (PHENERGAN) 25 MG tablet        No current facility-administered medications for this visit.   Past Medical History: Colonoscopy by Dr. Matthias Hughs in 2013.  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  PHYSICAL EXAM: BP 132/80  Pulse 67  Resp 18  Ht 5\' 11"  (1.803 m)  Wt 313 lb (141.976 kg)  BMI 43.67 kg/m2  General: Obese WN WM who is alert and generally healthy appearing.  Abdomen: Port tilted slightly cranially.  Abdominal wall looks good.  No hernia.  Procedure:  I accessed his lap band and found about 8.5 cc.  I added 0.8 cc for a total of 9.3 cc. He tolerated water, though he noticed some resistance.   He is to go on liquids for 2 days.  DATA REVIEWED: Reviewed chart.  Ovidio Kin, MD,  FACS Office:  [phone]

## 2012-10-09 ENCOUNTER — Encounter (INDEPENDENT_AMBULATORY_CARE_PROVIDER_SITE_OTHER): Payer: 59

## 2012-10-16 ENCOUNTER — Ambulatory Visit (INDEPENDENT_AMBULATORY_CARE_PROVIDER_SITE_OTHER): Payer: 59 | Admitting: Physician Assistant

## 2012-10-16 ENCOUNTER — Encounter (INDEPENDENT_AMBULATORY_CARE_PROVIDER_SITE_OTHER): Payer: Self-pay

## 2012-10-16 VITALS — BP 130/80 | HR 80 | Temp 97.8°F | Resp 17 | Ht 71.0 in | Wt 287.8 lb

## 2012-10-16 DIAGNOSIS — Z4651 Encounter for fitting and adjustment of gastric lap band: Secondary | ICD-10-CM

## 2012-10-16 NOTE — Progress Notes (Signed)
  HISTORY: William Booth is a 62 y.o.male who received an AP-Large lap-band in October 2007 by Dr. Ezzard Standing. He comes in with 25 lbs weight loss since his adjustment at the end of April. He has no complaints of regurgitation or reflux. He notes his hunger and portion sizes could be under better control. He is exercising on a regular basis. He wants a small fill today to help continue his weight loss. He has no complaints with respect to his recent umbilical hernia repair.  VITAL SIGNS: Filed Vitals:   10/16/12 1452  BP: 130/80  Pulse: 80  Temp: 97.8 F (36.6 C)  Resp: 17    PHYSICAL EXAM: Physical exam reveals a very well-appearing 61 y.o.male in no apparent distress Neurologic: Awake, alert, oriented Psych: Bright affect, conversant Respiratory: Breathing even and unlabored. No stridor or wheezing Abdomen: Soft, nontender, nondistended to palpation. Incisions well-healed. No incisional hernias. Port easily palpated. Extremities: Atraumatic, good range of motion.  ASSESMENT: 62 y.o.  male  s/p AP-Large lap-band.   PLAN: The patient's port was accessed with a 20G Huber needle without difficulty. Clear fluid was aspirated and 0.2 mL saline was added to the port to give a total predicted volume of 9.5 mL. The patient was able to swallow water without difficulty following the procedure and was instructed to take clear liquids for the next 24-48 hours and advance slowly as tolerated.

## 2012-10-16 NOTE — Patient Instructions (Signed)
Take clear liquids tonight. Thin protein shakes are ok to start tomorrow morning. Slowly advance your diet thereafter. Call us if you have persistent vomiting or regurgitation, night cough or reflux symptoms. Return as scheduled or sooner if you notice no changes in hunger/portion sizes.  

## 2012-11-23 ENCOUNTER — Other Ambulatory Visit: Payer: Self-pay | Admitting: Internal Medicine

## 2012-11-24 NOTE — Telephone Encounter (Signed)
Ok to refill until next ov in December

## 2012-11-24 NOTE — Telephone Encounter (Signed)
Please advise if okay to refill. Thanks.  

## 2012-11-27 ENCOUNTER — Encounter (INDEPENDENT_AMBULATORY_CARE_PROVIDER_SITE_OTHER): Payer: Self-pay

## 2012-11-27 ENCOUNTER — Ambulatory Visit (INDEPENDENT_AMBULATORY_CARE_PROVIDER_SITE_OTHER): Payer: 59 | Admitting: Physician Assistant

## 2012-11-27 VITALS — BP 142/94 | HR 68 | Temp 97.0°F | Resp 15 | Ht 71.0 in | Wt 287.4 lb

## 2012-11-27 DIAGNOSIS — Z4651 Encounter for fitting and adjustment of gastric lap band: Secondary | ICD-10-CM

## 2012-11-27 NOTE — Patient Instructions (Signed)

## 2012-11-27 NOTE — Progress Notes (Signed)
  HISTORY: William Booth is a 62 y.o.male who received an AP-Large lap-band in October 2007 by Dr. Ezzard Standing. He comes in with stable weight but continuing hunger and larger than desired portions. He denies regurgitation or reflux symptoms. He wants a fill today to get his weight back down.  VITAL SIGNS: Filed Vitals:   11/27/12 1433  BP: 142/94  Pulse: 68  Temp: 97 F (36.1 C)  Resp: 15    PHYSICAL EXAM: Physical exam reveals a very well-appearing 61 y.o.male in no apparent distress Neurologic: Awake, alert, oriented Psych: Bright affect, conversant Respiratory: Breathing even and unlabored. No stridor or wheezing Abdomen: Soft, nontender, nondistended to palpation. Incisions well-healed. No incisional hernias. Port easily palpated. Extremities: Atraumatic, good range of motion.  ASSESMENT: 62 y.o.  male  s/p AP-Large lap-band.   PLAN: The patient's port was accessed with a 20G Huber needle without difficulty. Clear fluid was aspirated and 0.25 mL saline was added to the port to give a total predicted volume of 9.75 mL. The patient was able to swallow water without difficulty following the procedure and was instructed to take clear liquids for the next 24-48 hours and advance slowly as tolerated.

## 2013-02-19 ENCOUNTER — Ambulatory Visit (INDEPENDENT_AMBULATORY_CARE_PROVIDER_SITE_OTHER): Payer: 59 | Admitting: Physician Assistant

## 2013-02-19 ENCOUNTER — Encounter (INDEPENDENT_AMBULATORY_CARE_PROVIDER_SITE_OTHER): Payer: Self-pay

## 2013-02-19 VITALS — BP 146/80 | HR 72 | Temp 98.1°F | Resp 16 | Ht 71.0 in | Wt 280.4 lb

## 2013-02-19 DIAGNOSIS — Z9884 Bariatric surgery status: Secondary | ICD-10-CM

## 2013-02-19 NOTE — Patient Instructions (Signed)
Return in three months. Focus on good food choices as well as physical activity. Return sooner if you have an increase in hunger, portion sizes or weight. Return also for difficulty swallowing, night cough, reflux.   

## 2013-02-19 NOTE — Progress Notes (Signed)
  HISTORY: William Booth is a 62 y.o.male who received an AP-Large lap-band in October 2007 by Dr. Ezzard Standing. He comes in with 7 lbs weight loss since his visit in July. He believes he's squarely in the green zone as he thinks any more fluid would render him with dysphagia. He is exercising frequently and watching his portion sizes.  VITAL SIGNS: Filed Vitals:   02/19/13 1354  BP: 146/80  Pulse: 72  Temp: 98.1 F (36.7 C)  Resp: 16    PHYSICAL EXAM: Physical exam reveals a very well-appearing 62 y.o.male in no apparent distress Neurologic: Awake, alert, oriented Psych: Bright affect, conversant Respiratory: Breathing even and unlabored. No stridor or wheezing Extremities: Atraumatic, good range of motion. Skin: Warm, Dry, no rashes Musculoskeletal: Normal gait, Joints normal  ASSESMENT: 62 y.o.  male  s/p AP-Large lap-band.   PLAN: We deferred a fill today as he's in the green zone. We'll have him return in three months or sooner should the need arise.

## 2013-03-05 ENCOUNTER — Other Ambulatory Visit: Payer: Self-pay

## 2013-03-05 ENCOUNTER — Telehealth (INDEPENDENT_AMBULATORY_CARE_PROVIDER_SITE_OTHER): Payer: Self-pay

## 2013-03-05 NOTE — Telephone Encounter (Signed)
Left a message for patient to call and reschedule her appointment with [facility] as Mardelle Matte will not be here on the 29th of Jan

## 2013-04-10 ENCOUNTER — Encounter: Payer: Self-pay | Admitting: Internal Medicine

## 2013-04-10 ENCOUNTER — Ambulatory Visit (INDEPENDENT_AMBULATORY_CARE_PROVIDER_SITE_OTHER): Payer: 59 | Admitting: Internal Medicine

## 2013-04-10 VITALS — BP 124/68 | HR 74 | Ht 70.0 in | Wt 281.4 lb

## 2013-04-10 DIAGNOSIS — G4733 Obstructive sleep apnea (adult) (pediatric): Secondary | ICD-10-CM

## 2013-04-10 MED ORDER — MODAFINIL 200 MG PO TABS: ORAL_TABLET | ORAL | Status: DC

## 2013-04-10 NOTE — Progress Notes (Signed)
12/03/11- 68 yoM never smoker formerly a patient of my old practice, now seeking to reestablish because of sleep apnea. PCP Dr Kirtland Bouchard. William Booth Complains of daytime sleepiness, falling asleep during meetings and with long driving. He has remained fully compliant with his CPAP 11, getting supplies on line. Bedtime midnight to 1:30 AM with sleep latency less than 15 minutes and not waking until he gets up between 9 and 10 AM. 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 Wife tells him he does not snore through his current CPAP, using nasal pillows mask. Occasionally uses Elavil 100 mg as a sleeping pill only when traveling. He has been very heavy but says he lost over 100 pounds in 4 years. Lab band surgery at 389 pounds, down now around 278 pounds. No ENT surgery. Medical problems include hypertension, diabetes, allergic rhinitis. GERD is controlled with omeprazole.  01/04/12- 11 yoM never smoker formerly a patient of my old practice, now seeking to reestablish because of sleep apnea. PCP Dr Kirtland Bouchard. [name]. Machine changed out. Download is still pending. Sample Nuvigil 150 helped but was not quite strong enough for residual daytime sleepiness. We discussed medication alternatives, sleep hygiene, naps and expectations from optimal CPAP control.  04/04/12-61 yoM never smoker followed for OSA, hx gastric banding FOLLOWS FOR: wears CPAP 12/ supplies on-line every night for 8-9 hours and pressure of 12 working well for patient. Had umbilical hernia repair with mesh and lap band by Dr. Ezzard Standing. No respiratory problems during the surgery. CPAP now at 12/ Advanced with new machine Continues Provigil 200 mg daily when needed. Tolerating this well. Try to maintain good sleep hygiene and take occasional nap. Sleeps well.  04/10/13- 62 yoM never smoker followed for OSA, hx gastric banding FOLLOWS FOR: wears CPAP 12/ Advanced every night for about 7-8 hours; pressure is  working well for patient; DME is [facility]. He feels he is doing very well and says quality of life is much better with CPAP. Provigil has been a big help for alertness at work when needed.  ROS-see HPI Constitutional:   + weight loss, No-night sweats, fevers, chills, fatigue, lassitude. HEENT:   No-  headaches, difficulty swallowing, tooth/dental problems, sore throat,       No-  sneezing, itching, ear ache, nasal congestion, post nasal drip,  CV:  No-   chest pain, orthopnea, PND, swelling in lower extremities, anasarca,  dizziness, palpitations Resp: No-   shortness of breath with exertion or at rest.              No-   productive cough,  No non-productive cough,  No- coughing up of blood.              No-   change in color of mucus.  No- wheezing.   Skin: No-   rash or lesions. GI:  No-   heartburn, indigestion, abdominal pain, nausea, vomiting,  GU: MS:  No-   joint pain or swelling.   Neuro-     nothing unusual Psych:  No- change in mood or affect. No depression or anxiety.  No memory loss.  OBJ- Physical Exam General- Alert, Oriented, Affect-appropriate, Distress- none acute, +remains overweight Skin- rash-none, lesions- none, excoriation- none Lymphadenopathy- none Head- atraumatic            Eyes- Gross vision intact, PERRLA, conjunctivae and secretions clear            Ears- Hearing, canals-normal  Nose- Clear, no-Septal dev, mucus, polyps, erosion, perforation             Throat- Mallampati III , mucosa clear , drainage- none, tonsils- atrophic Neck- flexible , trachea midline, no stridor , thyroid nl, carotid no bruit Chest - symmetrical excursion , unlabored           Heart/CV- RRR , no murmur , no gallop  , no rub, nl s1 s2                           - JVD- none , edema- none, stasis changes- none, varices- none           Lung- clear to P&A, wheeze- none, cough- none , dullness-none, rub- none           Chest wall-  Abd-  Br/ Gen/ Rectal- Not done, not  indicated Extrem- cyanosis- none, clubbing, none, atrophy- none, strength- nl Neuro- grossly intact to observation

## 2013-04-10 NOTE — Patient Instructions (Signed)
Script for Provigil refill  We can continue CPAP 12/ Advanced  Please call as needed

## 2013-05-03 ENCOUNTER — Encounter: Payer: Self-pay | Admitting: Internal Medicine

## 2013-05-03 NOTE — Assessment & Plan Note (Signed)
Good compliance and control. Still needing Provigil for residual daytime somnolence

## 2013-05-28 ENCOUNTER — Encounter (INDEPENDENT_AMBULATORY_CARE_PROVIDER_SITE_OTHER): Payer: 59

## 2013-07-24 ENCOUNTER — Encounter: Payer: Self-pay | Admitting: Internal Medicine

## 2013-07-24 ENCOUNTER — Other Ambulatory Visit: Payer: Self-pay | Admitting: Internal Medicine

## 2013-07-27 NOTE — Telephone Encounter (Signed)
Please advise if okay to refill. Thanks.  

## 2013-07-27 NOTE — Telephone Encounter (Signed)
Ok to refill as before 

## 2013-07-28 NOTE — Telephone Encounter (Signed)
Called refill to pharmacy voicemail.  

## 2013-09-24 ENCOUNTER — Emergency Department (HOSPITAL_COMMUNITY): Payer: 59

## 2013-09-24 ENCOUNTER — Encounter (HOSPITAL_COMMUNITY): Payer: Self-pay | Admitting: Emergency Medicine

## 2013-09-24 ENCOUNTER — Emergency Department (HOSPITAL_COMMUNITY)
Admission: EM | Admit: 2013-09-24 | Discharge: 2013-09-24 | Disposition: A | Discharge status: Home / Self Care | Payer: 59 | Attending: Emergency Medicine | Admitting: Emergency Medicine

## 2013-09-24 DIAGNOSIS — I10 Essential (primary) hypertension: Secondary | ICD-10-CM | POA: Insufficient documentation

## 2013-09-24 DIAGNOSIS — Z792 Long term (current) use of antibiotics: Secondary | ICD-10-CM | POA: Insufficient documentation

## 2013-09-24 DIAGNOSIS — G47419 Narcolepsy without cataplexy: Secondary | ICD-10-CM | POA: Insufficient documentation

## 2013-09-24 DIAGNOSIS — E119 Type 2 diabetes mellitus without complications: Secondary | ICD-10-CM | POA: Insufficient documentation

## 2013-09-24 DIAGNOSIS — Z23 Encounter for immunization: Secondary | ICD-10-CM | POA: Insufficient documentation

## 2013-09-24 DIAGNOSIS — Z79899 Other long term (current) drug therapy: Secondary | ICD-10-CM | POA: Insufficient documentation

## 2013-09-24 DIAGNOSIS — Y92009 Unspecified place in unspecified non-institutional (private) residence as the place of occurrence of the external cause: Secondary | ICD-10-CM | POA: Insufficient documentation

## 2013-09-24 DIAGNOSIS — G4730 Sleep apnea, unspecified: Secondary | ICD-10-CM | POA: Insufficient documentation

## 2013-09-24 DIAGNOSIS — Z8719 Personal history of other diseases of the digestive system: Secondary | ICD-10-CM | POA: Insufficient documentation

## 2013-09-24 DIAGNOSIS — S6990XA Unspecified injury of unspecified wrist, hand and finger(s), initial encounter: Secondary | ICD-10-CM | POA: Insufficient documentation

## 2013-09-24 DIAGNOSIS — E786 Lipoprotein deficiency: Secondary | ICD-10-CM | POA: Insufficient documentation

## 2013-09-24 DIAGNOSIS — S1093XA Contusion of unspecified part of neck, initial encounter: Secondary | ICD-10-CM

## 2013-09-24 DIAGNOSIS — S0083XA Contusion of other part of head, initial encounter: Secondary | ICD-10-CM | POA: Insufficient documentation

## 2013-09-24 DIAGNOSIS — IMO0002 Reserved for concepts with insufficient information to code with codable children: Secondary | ICD-10-CM | POA: Insufficient documentation

## 2013-09-24 DIAGNOSIS — F411 Generalized anxiety disorder: Secondary | ICD-10-CM | POA: Insufficient documentation

## 2013-09-24 DIAGNOSIS — W19XXXA Unspecified fall, initial encounter: Secondary | ICD-10-CM

## 2013-09-24 DIAGNOSIS — Z9981 Dependence on supplemental oxygen: Secondary | ICD-10-CM | POA: Insufficient documentation

## 2013-09-24 DIAGNOSIS — Z9889 Other specified postprocedural states: Secondary | ICD-10-CM | POA: Insufficient documentation

## 2013-09-24 DIAGNOSIS — S62309A Unspecified fracture of unspecified metacarpal bone, initial encounter for closed fracture: Secondary | ICD-10-CM | POA: Insufficient documentation

## 2013-09-24 DIAGNOSIS — Y9389 Activity, other specified: Secondary | ICD-10-CM | POA: Insufficient documentation

## 2013-09-24 DIAGNOSIS — S0003XA Contusion of scalp, initial encounter: Secondary | ICD-10-CM | POA: Insufficient documentation

## 2013-09-24 DIAGNOSIS — R296 Repeated falls: Secondary | ICD-10-CM | POA: Insufficient documentation

## 2013-09-24 DIAGNOSIS — S62308A Unspecified fracture of other metacarpal bone, initial encounter for closed fracture: Secondary | ICD-10-CM

## 2013-09-24 MED ORDER — ONDANSETRON 4 MG PO TBDP
4.0000 mg | ORAL_TABLET | Freq: Once | ORAL | Status: AC
  Administered 2013-09-24: 4 mg via ORAL
  Filled 2013-09-24: qty 1

## 2013-09-24 MED ORDER — TETANUS-DIPHTH-ACELL PERTUSSIS 5-2.5-18.5 LF-MCG/0.5 IM SUSP
0.5000 mL | Freq: Once | INTRAMUSCULAR | Status: AC
  Administered 2013-09-24: 0.5 mL via INTRAMUSCULAR
  Filled 2013-09-24: qty 0.5

## 2013-09-24 MED ORDER — MORPHINE SULFATE 4 MG/ML IJ SOLN
4.0000 mg | Freq: Once | INTRAMUSCULAR | Status: AC
  Administered 2013-09-24: 4 mg via INTRAMUSCULAR
  Filled 2013-09-24: qty 1

## 2013-09-24 MED ORDER — MORPHINE SULFATE 4 MG/ML IJ SOLN
4.0000 mg | Freq: Once | INTRAMUSCULAR | Status: AC
Start: 2013-09-24 — End: 2013-09-24
  Administered 2013-09-24: 4 mg via INTRAMUSCULAR
  Filled 2013-09-24: qty 1

## 2013-09-24 MED ORDER — OXYCODONE HCL 5 MG PO CAPS: 5.0000 mg | ORAL_CAPSULE | ORAL | Status: DC | PRN

## 2013-09-24 NOTE — Progress Notes (Signed)
Orthopedic Tech Progress Note Patient Details:  William Booth 1950-08-16 [id number]  Ortho Devices Type of Ortho Device: Soft collar Ortho Device/Splint Location: neck Ortho Device/Splint Interventions: Ordered;Application   Braulio Bosch 09/24/2013, 9:34 PM

## 2013-09-24 NOTE — Progress Notes (Signed)
Orthopedic Tech Progress Note Patient Details:  William Booth 1950/10/07 [id number]  Ortho Devices Type of Ortho Device: Ace wrap;Volar splint Ortho Device/Splint Location: LUE Ortho Device/Splint Interventions: Ordered;Application   Braulio Bosch 09/24/2013, 9:49 PM

## 2013-09-24 NOTE — ED Notes (Signed)
Pt escorted out by EMT, in wheelchair.

## 2013-09-24 NOTE — ED Provider Notes (Signed)
CSN: [id number]     Arrival date & time 09/24/13  1849 History   First MD Initiated Contact with Patient 09/24/13 1905     Chief Complaint  Patient presents with  . Fall     (Consider location/radiation/quality/duration/timing/severity/associated sxs/prior Treatment) HPI  Patient to the ER by EMS after a fall, patient reports chasing a black snake because he is tried to catch it when he lost his balance and fell face first into the grass. He reports that he had an MRI done and was told that he had fractured wrist and tendon damage prior to this fall. Now his wrist is hurting significantly worse. He is without a cast or splint on it. He so reports hitting his face against the ground.  His obvious contusion to the left for head. Also complains of neck pain. He denies being on blood thinners, losing consciousness, having headache, blurry vision, lower extremity weakness, bowel or bladder incontinence.   Past Medical History  Diagnosis Date  . Hypertension   . Diabetes mellitus     type 2  . Narcolepsy   . Anxiety   . GERD (gastroesophageal reflux disease)   . Umbilical hernia   . Hepatitis   . Low HDL (under 40)   . Peripheral neuropathy   . Sleep apnea     uses CPAP, last sleep study 1999, had titration 11/2011   Past Surgical History  Procedure Laterality Date  . Gastric restriction surgery  02/18/06    lap band  . Broken finger      right hand  . Tooth extraction  02/28/12  . Cardiac catheterization  06/2009  . Umbilical hernia repair  03/20/2012    Procedure: LAPAROSCOPIC UMBILICAL HERNIA;  Surgeon: Shann Medal, MD;  Location: [facility];  Service: General;  Laterality: N/A;   No family history on file. History  Substance Use Topics  . Smoking status: Never Smoker   . Smokeless tobacco: Never Used  . Alcohol Use: No    Review of Systems  All other systems reviewed and are negative.     Allergies  Acetaminophen and Nsaids  Home Medications   Prior to Admission  medications   Medication Sig Start Date End Date Taking? Authorizing Provider  ALPRAZolam Duanne Moron) 0.5 MG tablet Take 0.5 mg by mouth 2 (two) times daily as needed for anxiety.  02/16/11  Yes Historical Provider, MD  amitriptyline (ELAVIL) 100 MG tablet Take 100 mg by mouth at bedtime as needed for sleep.  02/14/11  Yes Historical Provider, MD  atenolol (TENORMIN) 50 MG tablet Take 50 mg by mouth 2 (two) times daily.   Yes Historical Provider, MD  buPROPion (WELLBUTRIN XL) 150 MG 24 hr tablet Take 150 mg by mouth 2 (two) times daily.   Yes Historical Provider, MD  fenofibrate 160 MG tablet Take 160 mg by mouth daily.   Yes Historical Provider, MD  FLUoxetine (PROZAC) 20 MG capsule Take 20 mg by mouth at bedtime.   Yes Historical Provider, MD  glimepiride (AMARYL) 4 MG tablet Take 4 mg by mouth 2 (two) times daily.   Yes Historical Provider, MD  lamoTRIgine (LAMICTAL) 100 MG tablet Take 50 mg by mouth at bedtime.  09/23/13  Yes Historical Provider, MD  losartan-hydrochlorothiazide (HYZAAR) 100-25 MG per tablet Take 1 tablet by mouth daily.     Yes Historical Provider, MD  metFORMIN (GLUCOPHAGE) 1000 MG tablet Take 1,000 mg by mouth 2 (two) times daily with a meal.  Yes Historical Provider, MD  modafinil (PROVIGIL) 200 MG tablet Take 200 mg by mouth daily.   Yes Historical Provider, MD  niacin (NIASPAN) 500 MG CR tablet Take 500 mg by mouth at bedtime.   Yes Historical Provider, MD  omega-3 acid ethyl esters (LOVAZA) 1 G capsule Take 1 g by mouth daily.  06/29/13  Yes Historical Provider, MD  rosuvastatin (CRESTOR) 20 MG tablet Take 20 mg by mouth daily.   Yes Historical Provider, MD  oxycodone (OXY-IR) 5 MG capsule Take 1-2 capsules (5-10 mg total) by mouth every 4 (four) hours as needed. 09/24/13   Beronica Lansdale Marilu Favre, PA-C   BP 115/62  Pulse 67  Temp(Src) 97.8 F (36.6 C) (Oral)  Resp 17  Ht 5\' 11"  (1.803 m)  Wt 267 lb (121.11 kg)  BMI 37.26 kg/m2  SpO2 99% Physical Exam  Nursing note and  vitals reviewed. Constitutional: He appears well-developed and well-nourished. He appears distressed (in pain).  HENT:  Head: Normocephalic. Head is with abrasion and with contusion. Head is without raccoon's eyes, without Battle's sign, without laceration, without right periorbital erythema and without left periorbital erythema. Hair is normal.    Eyes: Pupils are equal, round, and reactive to light.  Neck: Normal range of motion. Neck supple. Spinous process tenderness and muscular tenderness present.  Cardiovascular: Normal rate and regular rhythm.   Pulmonary/Chest: Effort normal.  Abdominal: Soft.  Musculoskeletal:       Left wrist: He exhibits decreased range of motion, tenderness, bony tenderness, swelling and crepitus. He exhibits no effusion, no deformity and no laceration.       Left knee: He exhibits normal range of motion.       Left hand: He exhibits decreased range of motion, tenderness, bony tenderness and swelling. He exhibits normal capillary refill, no deformity and no laceration. Normal sensation noted. Decreased strength (due to pain) noted.       Legs: Neurological: He is alert.  Normal strength and sensation to bilateral lower extremities.  Skin: Skin is warm and dry.    ED Course  Procedures (including critical care time) Labs Review Labs Reviewed - No data to display  Imaging Review Dg Wrist Complete Left  09/24/2013   CLINICAL DATA:  Pain post fall  EXAM: LEFT WRIST - COMPLETE 3+ VIEW  COMPARISON:  None.  FINDINGS: Four views of the left wrist submitted. No acute fracture or subluxation. There is displaced comminuted fracture at the base of fifth metacarpal.  IMPRESSION: Displaced comminuted fracture at the base of fifth metacarpal.   Electronically Signed   By: Lahoma Crocker M.D.   On: 09/24/2013 21:00   Ct Head Wo Contrast  09/24/2013   CLINICAL DATA:  Fall.  EXAM: CT HEAD WITHOUT CONTRAST  CT MAXILLOFACIAL WITHOUT CONTRAST  CT CERVICAL SPINE WITHOUT CONTRAST   TECHNIQUE: Multidetector CT imaging of the head, cervical spine, and maxillofacial structures were performed using the standard protocol without intravenous contrast. Multiplanar CT image reconstructions of the cervical spine and maxillofacial structures were also generated.  COMPARISON:  None.  FINDINGS: CT HEAD FINDINGS  No mass. No hydrocephalus. No hemorrhage. No acute bony abnormality. Paranasal sinuses are clear.  CT MAXILLOFACIAL FINDINGS  Paranasal sinuses are clear. Orbits are intact. Zygomas are intact. Mandible is intact. Shotty cervical lymph nodes are present.  CT CERVICAL SPINE FINDINGS  Diffuse degenerative change of the cervical spine. No evidence of fracture or dislocation. Pulmonary apices are clear.  IMPRESSION: 1. No acute intracranial abnormality. 2. No  evidence of facial fracture. 3. No acute cervical abnormality. No evidence of fracture. Severe degenerative change cervical spine.   Electronically Signed   By: [place]berry   On: 09/24/2013 20:59   Ct Cervical Spine Wo Contrast  09/24/2013   CLINICAL DATA:  Fall.  EXAM: CT HEAD WITHOUT CONTRAST  CT MAXILLOFACIAL WITHOUT CONTRAST  CT CERVICAL SPINE WITHOUT CONTRAST  TECHNIQUE: Multidetector CT imaging of the head, cervical spine, and maxillofacial structures were performed using the standard protocol without intravenous contrast. Multiplanar CT image reconstructions of the cervical spine and maxillofacial structures were also generated.  COMPARISON:  None.  FINDINGS: CT HEAD FINDINGS  No mass. No hydrocephalus. No hemorrhage. No acute bony abnormality. Paranasal sinuses are clear.  CT MAXILLOFACIAL FINDINGS  Paranasal sinuses are clear. Orbits are intact. Zygomas are intact. Mandible is intact. Shotty cervical lymph nodes are present.  CT CERVICAL SPINE FINDINGS  Diffuse degenerative change of the cervical spine. No evidence of fracture or dislocation. Pulmonary apices are clear.  IMPRESSION: 1. No acute intracranial abnormality. 2. No  evidence of facial fracture. 3. No acute cervical abnormality. No evidence of fracture. Severe degenerative change cervical spine.   Electronically Signed   By: Marcello Moores  Register   On: 09/24/2013 20:59   Dg Hand Complete Left  09/24/2013   CLINICAL DATA:  Fall, hand pain  EXAM: LEFT HAND - COMPLETE 3+ VIEW  COMPARISON:  Concurrently obtained radiographs of the wrist  FINDINGS: Displaced and comminuted fracture of the base of the fifth metacarpal. The remainder the visualized bones and joints are unremarkable. Prominent osteophyte formation at the distal aspect of the ulna. Soft tissue swelling is noted about the hypo thenar eminence.  IMPRESSION: Displaced and comminuted fracture of the base of the fifth metacarpal.   Electronically Signed   By: Jacqulynn Cadet M.D.   On: 09/24/2013 21:12   Ct Maxillofacial Wo Cm  09/24/2013   CLINICAL DATA:  Fall.  EXAM: CT HEAD WITHOUT CONTRAST  CT MAXILLOFACIAL WITHOUT CONTRAST  CT CERVICAL SPINE WITHOUT CONTRAST  TECHNIQUE: Multidetector CT imaging of the head, cervical spine, and maxillofacial structures were performed using the standard protocol without intravenous contrast. Multiplanar CT image reconstructions of the cervical spine and maxillofacial structures were also generated.  COMPARISON:  None.  FINDINGS: CT HEAD FINDINGS  No mass. No hydrocephalus. No hemorrhage. No acute bony abnormality. Paranasal sinuses are clear.  CT MAXILLOFACIAL FINDINGS  Paranasal sinuses are clear. Orbits are intact. Zygomas are intact. Mandible is intact. Shotty cervical lymph nodes are present.  CT CERVICAL SPINE FINDINGS  Diffuse degenerative change of the cervical spine. No evidence of fracture or dislocation. Pulmonary apices are clear.  IMPRESSION: 1. No acute intracranial abnormality. 2. No evidence of facial fracture. 3. No acute cervical abnormality. No evidence of fracture. Severe degenerative change cervical spine.   Electronically Signed   By: Marcello Moores  Register   On:  09/24/2013 20:59     EKG Interpretation None      MDM   Final diagnoses:  Fall  Closed fracture of 5th metacarpal  Facial contusion    Patient has refractured his left wrist. No prior xrays in the chart but I will assume that it is worsened since it is displaced, comminuted and painful. He will probably need a pin for this. Otherwise his CT scans are reassuring. No other emergent abnormality found. Will follow-up with his hand doctor, Dr. Apolonio Schneiders  Will rx; Oxcodone Pain treated in  ED.  63 y.o.Orpah Melter Hobson's evaluation  in the Emergency Department is complete. It has been determined that no acute conditions requiring further emergency intervention are present at this time. The patient/guardian have been advised of the diagnosis and plan. We have discussed signs and symptoms that warrant return to the ED, such as changes or worsening in symptoms.  Vital signs are stable at discharge. Filed Vitals:   09/24/13 2230  BP: 115/62  Pulse: 67  Temp:   Resp:     Patient/guardian has voiced understanding and agreed to follow-up with the PCP or specialist.    Linus Mako, PA-C 09/24/13 2311

## 2013-09-24 NOTE — ED Notes (Signed)
Pt arrives via EMS from home. States that he was chasing a snake out of his yard and fell face first in the grass. Pt states he fell onto his left wrist while trying to stop the fall. C/o neck, front of head and left wrist pain 138/88 HR 89 regular CBG 123.

## 2013-09-24 NOTE — Discharge Instructions (Signed)
Boxer's Fracture You have a break (fracture) of the fifth metacarpal bone. This is commonly called a boxer's fracture. This is the bone in the hand where the little finger attaches. The fracture is in the end of that bone, closest to the little finger. It is usually caused when you hit an object with a clenched fist. Often, the knuckle is pushed down by the impact. Sometimes, the fracture rotates out of position. A boxer's fracture will usually heal within 6 weeks, if it is treated properly and protected from re-injury. Surgery is sometimes needed. A cast, splint, or bulky hand dressing may be used to protect and immobilize a boxer's fracture. Do not remove this device or dressing until your caregiver approves. Keep your hand elevated, and apply ice packs for 15-20 minutes every 2 hours, for the first 2 days. Elevation and ice help reduce swelling and relieve pain. See your caregiver, or an orthopedic specialist, for follow-up care within the next 10 days. This is to make sure your fracture is healing properly. Document Released: 04/16/2005 Document Revised: 07/09/2011 Document Reviewed: 10/04/2006 [facility] Patient Information 2014 [place].  Cast or Splint Care Casts and splints support injured limbs and keep bones from moving while they heal. It is important to care for your cast or splint at home.  HOME CARE INSTRUCTIONS  Keep the cast or splint uncovered during the drying period. It can take 24 to 48 hours to dry if it is made of plaster. A fiberglass cast will dry in less than 1 hour.  Do not rest the cast on anything harder than a pillow for the first 24 hours.  Do not put weight on your injured limb or apply pressure to the cast until your health care provider gives you permission.  Keep the cast or splint dry. Wet casts or splints can lose their shape and may not support the limb as well. A wet cast that has lost its shape can also create harmful pressure on your skin when it dries.  Also, wet skin can become infected.  Cover the cast or splint with a plastic bag when bathing or when out in the rain or snow. If the cast is on the trunk of the body, take sponge baths until the cast is removed.  If your cast does become wet, dry it with a towel or a blow dryer on the cool setting only.  Keep your cast or splint clean. Soiled casts may be wiped with a moistened cloth.  Do not place any hard or soft foreign objects under your cast or splint, such as cotton, toilet paper, lotion, or powder.  Do not try to scratch the skin under the cast with any object. The object could get stuck inside the cast. Also, scratching could lead to an infection. If itching is a problem, use a blow dryer on a cool setting to relieve discomfort.  Do not trim or cut your cast or remove padding from inside of it.  Exercise all joints next to the injury that are not immobilized by the cast or splint. For example, if you have a long leg cast, exercise the hip joint and toes. If you have an arm cast or splint, exercise the shoulder, elbow, thumb, and fingers.  Elevate your injured arm or leg on 1 or 2 pillows for the first 1 to 3 days to decrease swelling and pain.It is best if you can comfortably elevate your cast so it is higher than your heart. [place]  IF:   Your cast or splint cracks.  Your cast or splint is too tight or too loose.  You have unbearable itching inside the cast.  Your cast becomes wet or develops a soft spot or area.  You have a bad smell coming from inside your cast.  You get an object stuck under your cast.  Your skin around the cast becomes red or raw.  You have new pain or worsening pain after the cast has been applied. SEEK IMMEDIATE MEDICAL CARE IF:   You have fluid leaking through the cast.  You are unable to move your fingers or toes.  You have discolored (blue or white), cool, painful, or very swollen fingers or toes beyond the cast.  You have  tingling or numbness around the injured area.  You have severe pain or pressure under the cast.  You have any difficulty with your breathing or have shortness of breath.  You have chest pain. Document Released: 04/13/2000 Document Revised: 02/04/2013 Document Reviewed: 10/23/2012 [facility] Patient Information 2014 [place].   Facial or Scalp Contusion A facial or scalp contusion is a deep bruise on the face or head. Injuries to the face and head generally cause a lot of swelling, especially around the eyes. Contusions are the result of an injury that caused bleeding under the skin. The contusion may turn blue, purple, or yellow. Minor injuries will give you a painless contusion, but more severe contusions may stay painful and swollen for a few weeks.  CAUSES  A facial or scalp contusion is caused by a blunt injury or trauma to the face or head area.  SIGNS AND SYMPTOMS   Swelling of the injured area.   Discoloration of the injured area.   Tenderness, soreness, or pain in the injured area.  DIAGNOSIS  The diagnosis can be made by taking a medical history and doing a physical exam. An X-ray exam, CT scan, or MRI may be needed to determine if there are any associated injuries, such as broken bones (fractures). TREATMENT  Often, the best treatment for a facial or scalp contusion is applying cold compresses to the injured area. Over-the-counter medicines may also be recommended for pain control.  HOME CARE INSTRUCTIONS   Only take over-the-counter or prescription medicines as directed by your health care provider.   Apply ice to the injured area.   Put ice in a plastic bag.   Place a towel between your skin and the bag.   Leave the ice on for 20 minutes, 2 3 times a day.  SEEK MEDICAL CARE IF:  You have bite problems.   You have pain with chewing.   You are concerned about facial defects. SEEK IMMEDIATE MEDICAL CARE IF:  You have severe pain or a headache that  is not relieved by medicine.   You have unusual sleepiness, confusion, or personality changes.   You throw up (vomit).   You have a persistent nosebleed.   You have double vision or blurred vision.   You have fluid drainage from your nose or ear.   You have difficulty walking or using your arms or legs.  MAKE SURE YOU:   Understand these instructions.  Will watch your condition.  Will get help right away if you are not doing well or get worse. Document Released: 05/24/2004 Document Revised: 02/04/2013 Document Reviewed: 11/27/2012 [facility] Patient Information 2014 [place], [place].

## 2013-09-25 NOTE — ED Provider Notes (Signed)
Medical screening examination/treatment/procedure(s) were conducted as a shared visit with non-physician practitioner(s) and myself.  I personally evaluated the patient during the encounter.   EKG Interpretation None      I interviewed and examined the patient. Lungs are CTAB. Cardiac exam wnl. Abdomen soft.  Abrasion to left forehead. Ttp of left wrist/hand. Found to have 5th mcp fx. Will have pt f/u w/ Hand.    Blanchard Kelch, MD 09/25/13 1036

## 2013-09-28 ENCOUNTER — Encounter (HOSPITAL_COMMUNITY): Payer: Self-pay

## 2013-09-29 ENCOUNTER — Encounter (HOSPITAL_COMMUNITY): Payer: Self-pay | Admitting: *Deleted

## 2013-09-29 MED ORDER — CHLORHEXIDINE GLUCONATE 4 % EX LIQD
60.0000 mL | Freq: Once | CUTANEOUS | Status: DC
  Filled 2013-09-29: qty 60

## 2013-09-29 MED ORDER — DEXTROSE 5 % IV SOLN
3.0000 g | INTRAVENOUS | Status: AC
  Administered 2013-09-30: 3 g via INTRAVENOUS
  Filled 2013-09-29: qty 3000

## 2013-09-29 NOTE — Progress Notes (Signed)
Pt's  PCP is Dr. Hulan Fess.

## 2013-09-30 ENCOUNTER — Ambulatory Visit (HOSPITAL_COMMUNITY): Payer: 59 | Admitting: Anesthesiology

## 2013-09-30 ENCOUNTER — Encounter (HOSPITAL_COMMUNITY): Payer: 59 | Admitting: Anesthesiology

## 2013-09-30 ENCOUNTER — Ambulatory Visit (HOSPITAL_COMMUNITY): Payer: 59

## 2013-09-30 ENCOUNTER — Encounter (HOSPITAL_COMMUNITY): Payer: Self-pay | Admitting: *Deleted

## 2013-09-30 ENCOUNTER — Ambulatory Visit (HOSPITAL_COMMUNITY)
Admission: RE | Admit: 2013-09-30 | Discharge: 2013-09-30 | Disposition: A | Discharge status: Home / Self Care | Payer: 59 | Source: Ambulatory Visit | Attending: Orthopedic Surgery | Admitting: Orthopedic Surgery

## 2013-09-30 ENCOUNTER — Encounter (HOSPITAL_COMMUNITY)
Admission: RE | Disposition: A | Discharge status: Home / Self Care | Payer: Self-pay | Source: Ambulatory Visit | Attending: Orthopedic Surgery

## 2013-09-30 DIAGNOSIS — E119 Type 2 diabetes mellitus without complications: Secondary | ICD-10-CM | POA: Insufficient documentation

## 2013-09-30 DIAGNOSIS — Z79899 Other long term (current) drug therapy: Secondary | ICD-10-CM | POA: Insufficient documentation

## 2013-09-30 DIAGNOSIS — Y929 Unspecified place or not applicable: Secondary | ICD-10-CM | POA: Insufficient documentation

## 2013-09-30 DIAGNOSIS — G609 Hereditary and idiopathic neuropathy, unspecified: Secondary | ICD-10-CM | POA: Insufficient documentation

## 2013-09-30 DIAGNOSIS — I252 Old myocardial infarction: Secondary | ICD-10-CM | POA: Insufficient documentation

## 2013-09-30 DIAGNOSIS — J449 Chronic obstructive pulmonary disease, unspecified: Secondary | ICD-10-CM | POA: Insufficient documentation

## 2013-09-30 DIAGNOSIS — F411 Generalized anxiety disorder: Secondary | ICD-10-CM | POA: Insufficient documentation

## 2013-09-30 DIAGNOSIS — G4730 Sleep apnea, unspecified: Secondary | ICD-10-CM | POA: Insufficient documentation

## 2013-09-30 DIAGNOSIS — Z9884 Bariatric surgery status: Secondary | ICD-10-CM | POA: Insufficient documentation

## 2013-09-30 DIAGNOSIS — I10 Essential (primary) hypertension: Secondary | ICD-10-CM | POA: Insufficient documentation

## 2013-09-30 DIAGNOSIS — Z886 Allergy status to analgesic agent status: Secondary | ICD-10-CM | POA: Insufficient documentation

## 2013-09-30 DIAGNOSIS — I509 Heart failure, unspecified: Secondary | ICD-10-CM | POA: Insufficient documentation

## 2013-09-30 DIAGNOSIS — K219 Gastro-esophageal reflux disease without esophagitis: Secondary | ICD-10-CM | POA: Insufficient documentation

## 2013-09-30 DIAGNOSIS — S62319A Displaced fracture of base of unspecified metacarpal bone, initial encounter for closed fracture: Secondary | ICD-10-CM | POA: Insufficient documentation

## 2013-09-30 DIAGNOSIS — J4489 Other specified chronic obstructive pulmonary disease: Secondary | ICD-10-CM | POA: Insufficient documentation

## 2013-09-30 DIAGNOSIS — I2510 Atherosclerotic heart disease of native coronary artery without angina pectoris: Secondary | ICD-10-CM | POA: Insufficient documentation

## 2013-09-30 DIAGNOSIS — W19XXXA Unspecified fall, initial encounter: Secondary | ICD-10-CM | POA: Insufficient documentation

## 2013-09-30 DIAGNOSIS — G47419 Narcolepsy without cataplexy: Secondary | ICD-10-CM | POA: Insufficient documentation

## 2013-09-30 HISTORY — PX: CLOSED REDUCTION FINGER WITH PERCUTANEOUS PINNING: SHX5612

## 2013-09-30 LAB — GLUCOSE, CAPILLARY
GLUCOSE-CAPILLARY: 156 mg/dL — AB (ref 70–99)
GLUCOSE-CAPILLARY: 88 mg/dL (ref 70–99)
GLUCOSE-CAPILLARY: 81 mg/dL (ref 70–99)

## 2013-09-30 LAB — CBC
HCT: 40.7 % (ref 39.0–52.0)
HEMOGLOBIN: 14.1 g/dL (ref 13.0–17.0)
MCH: 31.1 pg (ref 26.0–34.0)
MCHC: 34.6 g/dL (ref 30.0–36.0)
MCV: 89.6 fL (ref 78.0–100.0)
Platelets: 230 10*3/uL (ref 150–400)
RBC: 4.54 MIL/uL (ref 4.22–5.81)
RDW: 13.8 % (ref 11.5–15.5)
WBC: 8 10*3/uL (ref 4.0–10.5)

## 2013-09-30 LAB — BASIC METABOLIC PANEL
BUN: 16 mg/dL (ref 6–23)
CHLORIDE: 99 meq/L (ref 96–112)
CO2: 22 meq/L (ref 19–32)
CREATININE: 1.23 mg/dL (ref 0.50–1.35)
Calcium: 9.2 mg/dL (ref 8.4–10.5)
GFR calc Af Amer: 72 mL/min — ABNORMAL LOW (ref >90)
GFR calc non Af Amer: 62 mL/min — ABNORMAL LOW (ref >90)
GLUCOSE: 170 mg/dL — AB (ref 70–99)
Potassium: 3.8 mEq/L (ref 3.7–5.3)
Sodium: 136 mEq/L — ABNORMAL LOW (ref 137–147)

## 2013-09-30 SURGERY — CLOSED REDUCTION, FINGER, WITH PERCUTANEOUS PINNING
Anesthesia: General | Site: Finger | Laterality: Left

## 2013-09-30 MED ORDER — HYDROMORPHONE HCL PF 1 MG/ML IJ SOLN
0.2500 mg | INTRAMUSCULAR | Status: DC | PRN
  Administered 2013-09-30 (×5): 0.5 mg via INTRAVENOUS

## 2013-09-30 MED ORDER — ATENOLOL 50 MG PO TABS
50.0000 mg | ORAL_TABLET | ORAL | Status: AC
  Administered 2013-09-30: 50 mg via ORAL
  Filled 2013-09-30: qty 1

## 2013-09-30 MED ORDER — LACTATED RINGERS IV SOLN
INTRAVENOUS | Status: DC
  Administered 2013-09-30: 13:00:00 via INTRAVENOUS

## 2013-09-30 MED ORDER — HYDROMORPHONE HCL PF 1 MG/ML IJ SOLN
INTRAMUSCULAR | Status: AC
  Administered 2013-09-30: 0.5 mg via INTRAVENOUS
  Filled 2013-09-30: qty 1

## 2013-09-30 MED ORDER — ACETAMINOPHEN 160 MG/5ML PO SOLN: 325.0000 mg | ORAL | Status: DC | PRN

## 2013-09-30 MED ORDER — BUPIVACAINE HCL (PF) 0.25 % IJ SOLN
INTRAMUSCULAR | Status: AC
  Filled 2013-09-30: qty 30

## 2013-09-30 MED ORDER — OXYCODONE HCL 5 MG PO TABS
ORAL_TABLET | ORAL | Status: AC
  Filled 2013-09-30: qty 2

## 2013-09-30 MED ORDER — LACTATED RINGERS IV SOLN
INTRAVENOUS | Status: DC | PRN
  Administered 2013-09-30: 17:00:00 via INTRAVENOUS

## 2013-09-30 MED ORDER — ACETAMINOPHEN 325 MG PO TABS: 325.0000 mg | ORAL_TABLET | ORAL | Status: DC | PRN

## 2013-09-30 MED ORDER — 0.9 % SODIUM CHLORIDE (POUR BTL) OPTIME
TOPICAL | Status: DC | PRN
  Administered 2013-09-30: 1000 mL

## 2013-09-30 MED ORDER — OXYCODONE HCL 5 MG PO TABS
10.0000 mg | ORAL_TABLET | ORAL | Status: AC | PRN
  Administered 2013-09-30: 10 mg via ORAL

## 2013-09-30 MED ORDER — FENTANYL CITRATE 0.05 MG/ML IJ SOLN
INTRAMUSCULAR | Status: DC | PRN
  Administered 2013-09-30: 50 ug via INTRAVENOUS

## 2013-09-30 MED ORDER — FENTANYL CITRATE 0.05 MG/ML IJ SOLN
INTRAMUSCULAR | Status: AC
  Filled 2013-09-30: qty 5

## 2013-09-30 MED ORDER — PROMETHAZINE HCL 25 MG/ML IJ SOLN: 6.2500 mg | INTRAMUSCULAR | Status: DC | PRN

## 2013-09-30 MED ORDER — VITAMIN C 500 MG PO TABS: 500.0000 mg | ORAL_TABLET | Freq: Every day | ORAL | Status: DC

## 2013-09-30 MED ORDER — OXYCODONE HCL 5 MG PO TABS
5.0000 mg | ORAL_TABLET | Freq: Once | ORAL | Status: AC | PRN
  Administered 2013-09-30: 5 mg via ORAL

## 2013-09-30 MED ORDER — BUPIVACAINE HCL (PF) 0.25 % IJ SOLN
INTRAMUSCULAR | Status: DC | PRN
  Administered 2013-09-30: 30 mL

## 2013-09-30 MED ORDER — MIDAZOLAM HCL 5 MG/5ML IJ SOLN
INTRAMUSCULAR | Status: DC | PRN
  Administered 2013-09-30: 2 mg via INTRAVENOUS

## 2013-09-30 MED ORDER — GLYCOPYRROLATE 0.2 MG/ML IJ SOLN
INTRAMUSCULAR | Status: DC | PRN
  Administered 2013-09-30: 0.2 mg via INTRAVENOUS

## 2013-09-30 MED ORDER — HYDROMORPHONE HCL PF 1 MG/ML IJ SOLN
INTRAMUSCULAR | Status: AC
  Filled 2013-09-30: qty 1

## 2013-09-30 MED ORDER — HYDROMORPHONE HCL PF 1 MG/ML IJ SOLN
0.5000 mg | INTRAMUSCULAR | Status: DC | PRN
  Administered 2013-09-30 (×3): 0.5 mg via INTRAVENOUS

## 2013-09-30 MED ORDER — PROPOFOL 10 MG/ML IV BOLUS
INTRAVENOUS | Status: AC
  Filled 2013-09-30: qty 20

## 2013-09-30 MED ORDER — OXYCODONE HCL 5 MG PO TABS
ORAL_TABLET | ORAL | Status: AC
  Administered 2013-09-30: 5 mg via ORAL
  Filled 2013-09-30: qty 1

## 2013-09-30 MED ORDER — DOCUSATE SODIUM 100 MG PO CAPS: 100.0000 mg | ORAL_CAPSULE | Freq: Two times a day (BID) | ORAL | Status: DC

## 2013-09-30 MED ORDER — PROPOFOL 10 MG/ML IV BOLUS
INTRAVENOUS | Status: DC | PRN
  Administered 2013-09-30: 20 mg via INTRAVENOUS
  Administered 2013-09-30: 180 mg via INTRAVENOUS

## 2013-09-30 MED ORDER — OXYCODONE HCL 5 MG/5ML PO SOLN: 5.0000 mg | Freq: Once | ORAL | Status: AC | PRN

## 2013-09-30 MED ORDER — OXYCODONE HCL 10 MG PO TABS
10.0000 mg | ORAL_TABLET | Freq: Four times a day (QID) | ORAL | Status: DC | PRN
Start: 2013-09-30 — End: 2015-04-13

## 2013-09-30 MED ORDER — MIDAZOLAM HCL 2 MG/2ML IJ SOLN
INTRAMUSCULAR | Status: AC
  Filled 2013-09-30: qty 2

## 2013-09-30 MED ORDER — LIDOCAINE HCL (CARDIAC) 20 MG/ML IV SOLN
INTRAVENOUS | Status: DC | PRN
  Administered 2013-09-30: 80 mg via INTRAVENOUS

## 2013-09-30 SURGICAL SUPPLY — 40 items
BANDAGE ELASTIC 3 VELCRO ST LF (GAUZE/BANDAGES/DRESSINGS) ×3 IMPLANT
BANDAGE ELASTIC 4 VELCRO ST LF (GAUZE/BANDAGES/DRESSINGS) ×3 IMPLANT
BANDAGE GAUZE ELAST BULKY 4 IN (GAUZE/BANDAGES/DRESSINGS) ×3 IMPLANT
BENZOIN TINCTURE PRP APPL 2/3 (GAUZE/BANDAGES/DRESSINGS) IMPLANT
BLADE SURG ROTATE 9660 (MISCELLANEOUS) IMPLANT
CLOSURE WOUND 1/2 X4 (GAUZE/BANDAGES/DRESSINGS)
COVER SURGICAL LIGHT HANDLE (MISCELLANEOUS) ×3 IMPLANT
CUFF TOURNIQUET SINGLE 18IN (TOURNIQUET CUFF) ×3 IMPLANT
CUFF TOURNIQUET SINGLE 24IN (TOURNIQUET CUFF) IMPLANT
DRAPE OEC MINIVIEW 54X84 (DRAPES) ×3 IMPLANT
DRSG EMULSION OIL 3X3 NADH (GAUZE/BANDAGES/DRESSINGS) IMPLANT
GAUZE XEROFORM 1X8 LF (GAUZE/BANDAGES/DRESSINGS) IMPLANT
GAUZE XEROFORM 5X9 LF (GAUZE/BANDAGES/DRESSINGS) ×3 IMPLANT
GLOVE BIOGEL PI IND STRL 8.5 (GLOVE) ×1 IMPLANT
GLOVE BIOGEL PI INDICATOR 8.5 (GLOVE) ×2
GLOVE SURG ORTHO 8.0 STRL STRW (GLOVE) ×3 IMPLANT
GOWN STRL REUS W/ TWL LRG LVL3 (GOWN DISPOSABLE) ×2 IMPLANT
GOWN STRL REUS W/TWL LRG LVL3 (GOWN DISPOSABLE) ×4
K-WIRE .045 CH (WIRE) ×18
KIT BASIN OR (CUSTOM PROCEDURE TRAY) ×3 IMPLANT
KIT ROOM TURNOVER OR (KITS) ×3 IMPLANT
KWIRE .045 CH (WIRE) ×6 IMPLANT
NS IRRIG 1000ML POUR BTL (IV SOLUTION) ×3 IMPLANT
PACK ORTHO EXTREMITY (CUSTOM PROCEDURE TRAY) ×3 IMPLANT
PAD ARMBOARD 7.5X6 YLW CONV (MISCELLANEOUS) IMPLANT
PAD CAST 3X4 CTTN HI CHSV (CAST SUPPLIES) ×1 IMPLANT
PAD CAST 4YDX4 CTTN HI CHSV (CAST SUPPLIES) ×1 IMPLANT
PADDING CAST COTTON 3X4 STRL (CAST SUPPLIES) ×2
PADDING CAST COTTON 4X4 STRL (CAST SUPPLIES) ×2
SPLINT FIBERGLASS 4X30 (CAST SUPPLIES) ×3 IMPLANT
SPONGE GAUZE 4X4 12PLY (GAUZE/BANDAGES/DRESSINGS) IMPLANT
SPONGE GAUZE 4X4 12PLY STER LF (GAUZE/BANDAGES/DRESSINGS) ×3 IMPLANT
STRIP CLOSURE SKIN 1/2X4 (GAUZE/BANDAGES/DRESSINGS) IMPLANT
SUT ETHILON 4 0 P 3 18 (SUTURE) IMPLANT
SUT ETHILON 5 0 P 3 18 (SUTURE)
SUT NYLON ETHILON 5-0 P-3 1X18 (SUTURE) IMPLANT
SUT PROLENE 4 0 P 3 18 (SUTURE) IMPLANT
TOWEL OR 17X24 6PK STRL BLUE (TOWEL DISPOSABLE) ×3 IMPLANT
TOWEL OR 17X26 10 PK STRL BLUE (TOWEL DISPOSABLE) ×3 IMPLANT
WATER STERILE IRR 1000ML POUR (IV SOLUTION) IMPLANT

## 2013-09-30 NOTE — H&P (Signed)
William Booth is an 63 y.o. male.   Chief Complaint: LEFT HAND INJURY HPI: PT FELL SUSTAINING INJURY TO LEFT HAND PT SEEN/EVALUATED IN OFFICE  HERE FOR SURGERY ON LEFT HAND NO PRIOR SURGERY TO LEFT HAND  Past Medical History  Diagnosis Date  . Hypertension   . Narcolepsy   . Anxiety   . GERD (gastroesophageal reflux disease)   . Umbilical hernia   . Low HDL (under 40)   . Sleep apnea     uses CPAP, last sleep study 1999, had titration 11/2011  . Diabetes mellitus     type 2  . Peripheral neuropathy     not taking any meds at this time    Past Surgical History  Procedure Laterality Date  . Gastric restriction surgery  02/18/06    lap band  . Broken finger      right hand  . Tooth extraction  02/28/12  . Cardiac catheterization  06/2009  . Umbilical hernia repair  03/20/2012    Procedure: LAPAROSCOPIC UMBILICAL HERNIA;  Surgeon: Shann Medal, MD;  Location: [place];  Service: General;  Laterality: N/A;  . Colonoscopy      History reviewed. No pertinent family history. Social History:  reports that he has never smoked. He has never used smokeless tobacco. He reports that he does not drink alcohol or use illicit drugs.  Allergies:  Allergies  Allergen Reactions  . Acetaminophen Anaphylaxis  . Nsaids Hives and Rash    Face only will spread if not caught early.  Can take ASA    Medications Prior to Admission  Medication Sig Dispense Refill  . ALPRAZolam (XANAX) 0.5 MG tablet Take 0.5 mg by mouth 2 (two) times daily as needed for anxiety.       Marland Kitchen atenolol (TENORMIN) 50 MG tablet Take 50 mg by mouth 2 (two) times daily.      Marland Kitchen buPROPion (WELLBUTRIN XL) 150 MG 24 hr tablet Take 150 mg by mouth 2 (two) times daily.      . fenofibrate 160 MG tablet Take 160 mg by mouth daily.      Marland Kitchen FLUoxetine (PROZAC) 20 MG capsule Take 20 mg by mouth at bedtime.      Marland Kitchen glimepiride (AMARYL) 4 MG tablet Take 4 mg by mouth 2 (two) times daily.      Marland Kitchen lamoTRIgine (LAMICTAL) 100 MG tablet  Take 50 mg by mouth at bedtime.       Marland Kitchen losartan-hydrochlorothiazide (HYZAAR) 100-25 MG per tablet Take 1 tablet by mouth daily.        . metFORMIN (GLUCOPHAGE) 1000 MG tablet Take 1,000 mg by mouth 2 (two) times daily with a meal.        . niacin (NIASPAN) 500 MG CR tablet Take 500 mg by mouth at bedtime.      Marland Kitchen omega-3 acid ethyl esters (LOVAZA) 1 G capsule Take 1 g by mouth daily.       Marland Kitchen oxycodone (OXY-IR) 5 MG capsule Take 1-2 capsules (5-10 mg total) by mouth every 4 (four) hours as needed.  20 capsule  0  . rosuvastatin (CRESTOR) 20 MG tablet Take 20 mg by mouth daily.      Marland Kitchen amitriptyline (ELAVIL) 100 MG tablet Take 100 mg by mouth at bedtime as needed for sleep.       . modafinil (PROVIGIL) 200 MG tablet Take 200 mg by mouth daily.        Results for orders placed during the hospital  encounter of 09/30/13 (from the past 48 hour(s))  CBC     Status: None   Collection Time    09/30/13 12:55 PM      Result Value Ref Range   WBC 8.0  4.0 - 10.5 K/uL   RBC 4.54  4.22 - 5.81 MIL/uL   Hemoglobin 14.1  13.0 - 17.0 g/dL   HCT 74.8  36.4 - 15.5 %   MCV 89.6  78.0 - 100.0 fL   MCH 31.1  26.0 - 34.0 pg   MCHC 34.6  30.0 - 36.0 g/dL   RDW 57.7  70.1 - 79.7 %   Platelets 230  150 - 400 K/uL  BASIC METABOLIC PANEL     Status: Abnormal   Collection Time    09/30/13 12:55 PM      Result Value Ref Range   Sodium 136 (*) 137 - 147 mEq/L   Potassium 3.8  3.7 - 5.3 mEq/L   Chloride 99  96 - 112 mEq/L   CO2 22  19 - 32 mEq/L   Glucose, Bld 170 (*) 70 - 99 mg/dL   BUN 16  6 - 23 mg/dL   Creatinine, Ser 1.29  0.50 - 1.35 mg/dL   Calcium 9.2  8.4 - 08.9 mg/dL   GFR calc non Af Amer 62 (*) >90 mL/min   GFR calc Af Amer 72 (*) >90 mL/min   Comment: (NOTE)     The eGFR has been calculated using the CKD EPI equation.     This calculation has not been validated in all clinical situations.     eGFR's persistently <90 mL/min signify possible Chronic Kidney     Disease.  GLUCOSE, CAPILLARY      Status: Abnormal   Collection Time    09/30/13  1:27 PM      Result Value Ref Range   Glucose-Capillary 156 (*) 70 - 99 mg/dL   Dg Chest 2 View  10/01/8472   CLINICAL DATA:  Preop  EXAM: CHEST  2 VIEW  COMPARISON:  03/14/2012  FINDINGS: Normal heart size. Low lung volumes with subsegmental atelectasis at the left base. No pleural effusion. No pneumothorax.  IMPRESSION: No active cardiopulmonary disease.   Electronically Signed   By: Maryclare Bean M.D.   On: 09/30/2013 13:22    ROS NO RECENT ILLNESSES OR HOSPITALIZATIONS  Blood pressure 128/77, pulse 79, temperature 97.8 F (36.6 C), temperature source Oral, resp. rate 18, height 5\' 11"  (1.803 m), weight 121.11 kg (267 lb), SpO2 99.00%. Physical Exam General Appearance:  Alert, cooperative, no distress, appears stated age  Head:  Normocephalic, without obvious abnormality, atraumatic  Eyes:  Pupils equal, conjunctiva/corneas clear,         Throat: Lips, mucosa, and tongue normal; teeth and gums normal  Neck: No visible masses     Lungs:   respirations unlabored  Chest Wall:  No tenderness or deformity  Heart:  Regular rate and rhythm,  Abdomen:   Soft, non-tender,         Extremities: LEFT HAND IN VOLAR SPLINT FINGER TIPS WARM WELL PERFUSED ABLE TO WIGGLE FINGERS  Pulses: 2+ and symmetric  Skin: Skin color, texture, turgor normal, no rashes or lesions     Neurologic: Normal     Assessment/Plan LEFT SMALL FINGER COMMINUTED METACARPAL BASE FRACTURE INVOLVING THE ARTICULAR SURFACE OF CMC JOINT  LEFT SMALL FINGER CLOSED REDUCTION AND PINNING VERSUS OPEN REDUCTION AND INTERNAL FIXATION  R/B/A DISCUSSED WITH PT IN OFFICE.  PT VOICED  UNDERSTANDING OF PLAN CONSENT SIGNED DAY OF SURGERY PT SEEN AND EXAMINED PRIOR TO OPERATIVE PROCEDURE/DAY OF SURGERY SITE MARKED. QUESTIONS ANSWERED WILL [facility] FOLLOWING SURGERY  Janelle Floor [facility] 09/30/2013, 1710

## 2013-09-30 NOTE — Anesthesia Preprocedure Evaluation (Signed)
Anesthesia Evaluation  Patient identified by MRN, date of birth, ID band Patient awake    Reviewed: Allergy & Precautions, H&P , NPO status , Patient's Chart, lab work & pertinent test results, reviewed documented beta blocker date and time   History of Anesthesia Complications Negative for: history of anesthetic complications  Airway Mallampati: II TM Distance: >3 FB Neck ROM: Full    Dental  (+) Teeth Intact   Pulmonary neg shortness of breath, sleep apnea and Continuous Positive Airway Pressure Ventilation , neg COPD breath sounds clear to auscultation        Cardiovascular hypertension, Pt. on medications and Pt. on home beta blockers - CAD, - Past MI and - CHF - dysrhythmias Rhythm:Regular     Neuro/Psych PSYCHIATRIC DISORDERS Anxiety  Neuromuscular disease    GI/Hepatic Neg liver ROS, GERD-  ,  Endo/Other  diabetes, Type 2, Oral Hypoglycemic Agents  Renal/GU Renal InsufficiencyRenal disease     Musculoskeletal   Abdominal   Peds  Hematology negative hematology ROS (+)   Anesthesia Other Findings   Reproductive/Obstetrics                           Anesthesia Physical Anesthesia Plan  ASA: II  Anesthesia Plan: General   Post-op Pain Management:    Induction: Intravenous  Airway Management Planned: LMA  Additional Equipment: None  Intra-op Plan:   Post-operative Plan: Extubation in OR  Informed Consent: I have reviewed the patients History and Physical, chart, labs and discussed the procedure including the risks, benefits and alternatives for the proposed anesthesia with the patient or authorized representative who has indicated his/her understanding and acceptance.   Dental advisory given  Plan Discussed with: CRNA and Surgeon  Anesthesia Plan Comments:         Anesthesia Quick Evaluation

## 2013-09-30 NOTE — Anesthesia Procedure Notes (Signed)
Procedure Name: LMA Insertion Date/Time: 09/30/2013 5:52 PM Performed by: Eligha Bridegroom Pre-anesthesia Checklist: Emergency Drugs available, Timeout performed, Patient identified, Patient being monitored and Suction available Patient Re-evaluated:Patient Re-evaluated prior to inductionOxygen Delivery Method: Circle system utilized Preoxygenation: Pre-oxygenation with 100% oxygen Intubation Type: IV induction LMA: LMA with gastric port inserted LMA Size: 5.0 Number of attempts: 1 Placement Confirmation: positive ETCO2 and breath sounds checked- equal and bilateral Tube secured with: Tape Dental Injury: Teeth and Oropharynx as per pre-operative assessment

## 2013-09-30 NOTE — Discharge Instructions (Signed)
KEEP [facility] CALL OFFICE FOR F/U APPT [phone] IN 12 DAYS DR [place] [phone] ALSO CALL FOR THERAPY APPOINTMENT IN 12 DAYS [phone] EXT 64 KEEP HAND ELEVATED ABOVE HEART OK TO APPLY ICE TO OPERATIVE AREA CONTACT OFFICE IF ANY WORSENING PAIN OR CONCERNS.

## 2013-09-30 NOTE — Brief Op Note (Signed)
09/30/2013  4:25 PM  PATIENT:  Jannifer Franklin  63 y.o. male  PRE-OPERATIVE DIAGNOSIS:  left displaced fifth metacarpal fracture  POST-OPERATIVE DIAGNOSIS:  * No post-op diagnosis entered *  PROCEDURE:  Procedure(s): LEFT SMALL FINGER METACARPAL CLOSED REDUCTION WITH PERCUTANEOUS PINNING AND POSSIBLE ORIF (Left)  SURGEON:  Surgeon(s) and Role:    * Linna Hoff, MD - Primary  PHYSICIAN ASSISTANT:   ASSISTANTS: none   ANESTHESIA:   general  EBL:     BLOOD ADMINISTERED:none  DRAINS: none   LOCAL MEDICATIONS USED:  MARCAINE     SPECIMEN:  No Specimen  DISPOSITION OF SPECIMEN:  N/A  COUNTS:  YES  TOURNIQUET:    DICTATION: .725366  PLAN OF CARE: Discharge to home after PACU  PATIENT DISPOSITION:  PACU - hemodynamically stable.   Delay start of Pharmacological VTE agent (>24hrs) due to surgical blood loss or risk of bleeding: not applicable

## 2013-09-30 NOTE — Transfer of Care (Signed)
Immediate Anesthesia Transfer of Care Note  Patient: William Booth  Procedure(s) Performed: Procedure(s): LEFT SMALL FINGER METACARPAL CLOSED REDUCTION WITH PERCUTANEOUS PINNING (Left)  Patient Location: PACU  Anesthesia Type:General  Level of Consciousness: awake, alert  and oriented  Airway & Oxygen Therapy: Patient Spontanous Breathing and Patient connected to nasal cannula oxygen  Post-op Assessment: Report given to PACU RN and Post -op Vital signs reviewed and stable  Post vital signs: Reviewed and stable  Complications: No apparent anesthesia complications

## 2013-09-30 NOTE — Anesthesia Postprocedure Evaluation (Signed)
  Anesthesia Post-op Note  Patient: William Booth  Procedure(s) Performed: Procedure(s): LEFT SMALL FINGER METACARPAL CLOSED REDUCTION WITH PERCUTANEOUS PINNING (Left)  Patient Location: PACU  Anesthesia Type:General  Level of Consciousness: awake, alert , oriented and patient cooperative  Airway and Oxygen Therapy: Patient Spontanous Breathing  Post-op Pain: mild  Post-op Assessment: Post-op Vital signs reviewed, Patient's Cardiovascular Status Stable, Respiratory Function Stable, Patent Airway, No signs of Nausea or vomiting and Pain level controlled  Post-op Vital Signs: Reviewed and stable  Last Vitals:  Filed Vitals:   09/30/13 2130  BP:   Pulse: 59  Temp:   Resp: 7    Complications: No apparent anesthesia complications

## 2013-10-01 LAB — GLUCOSE, CAPILLARY: GLUCOSE-CAPILLARY: 108 mg/dL — AB (ref 70–99)

## 2013-10-01 NOTE — Op Note (Signed)
William Booth, William Booth               ACCOUNT NO.:  [id number]  MEDICAL RECORD NO.:  [id number]  LOCATION:  [facility]                         FACILITY:  [facility]  PHYSICIAN:  Melrose Nakayama, MD  DATE OF BIRTH:  16-Nov-1950  DATE OF PROCEDURE:  09/30/2013 DATE OF DISCHARGE:  09/30/2013                              OPERATIVE REPORT   PREOPERATIVE DIAGNOSIS:  Left small finger and comminuted intra- articular fracture of the small finger metacarpal base, involving the carpometacarpal joint, unstable carpometacarpal fracture dislocation.  POSTOPERATIVE DIAGNOSIS:  Left small finger and comminuted intra- articular fracture of the small finger metacarpal base, involving the carpometacarpal joint, unstable carpometacarpal fracture dislocation.  ATTENDING PHYSICIAN:  Linna Hoff IV, MD, who was scrubbed and present for the entire procedure.  ASSISTANT SURGEON:  None.  ANESTHESIA:  General via LMA.  SURGICAL PROCEDURE: 1. Percutaneous skeletal fixation unstable, left small finger CMC     dislocation other than the thumb. 2. Radiographs 3 views, left hand.  SURGICAL IMPLANTS:  Four 0.045 K-wires.  SURGICAL INDICATIONS:  William Booth is a 63 year old gentleman, who fell and landed on his outstretched left hand.  The patient sustained a comminuted intra-articular CMC fracture dislocation.  The patient was seen and evaluated in the office and recommended he undergo the above procedure.  Risks, benefits, and alternatives were discussed in detail with the patient.  Signed informed consent was obtained.  Risks include, but not limited to bleeding, infection; damage to nearby nerves, arteries, or tendons; loss of motion of wrist and digits, incomplete relief of symptoms, and need for further surgical intervention.  DESCRIPTION OF PROCEDURE:  The patient was properly identified in the preop holding area and marked with a permanent marker made on left hand to indicate the correct operative site.   The patient was brought back to the operating room, placed supine on anesthesia room table.  General anesthesia was administered.  The patient tolerated this well.  A well- padded tourniquet was then placed on left brachium, sealed with 1000 drape.  The left upper extremity was then prepped and draped in normal sterile fashion.  Time-out was called, correct site was identified, procedure was then begun.  Attention was then turned to left hand where after the small finger was then brought out into a longitudinal traction.  Following this, percutaneous skeletal fixation was then carried out of the unstable CMC fracture dislocation with putting 2 longitudinal K-wires into the ring finger metacarpal holding it out to length.  Following this, the K-wire was then placed across the metacarpal base across the joint surface and an additional K-wire across the [facility] joint from distal to proximal direction.  After all K-wires were cut, the K-wire was then cut and bent, left out of the skin.  Xeroform, bolster dressing were then applied around the pin sites.  Final radiographs were then obtained.  The patient was then placed in a well- padded ulnar gutter splint, extubated, and taken to recovery room in good condition.  INTRAOPERATIVE RADIOGRAPHS:  Three views of the hand did show the internal fixation in place.  There was good position in all planes.  POSTPROCEDURE PLAN:  The patient was discharged to home.  Seen back in the office in approximately 2 weeks for a pin check, application of a short-arm cast for a total of 3 weeks.  There were pins in for a total of 5 weeks, then x-rays at each visit, pins out of the 5-week mark, and then begin an outpatient therapy regimen.  Radiographs at each visit.     Melrose Nakayama, MD     FWO/MEDQ  D:  09/30/2013  T:  10/01/2013  Job:  [id number]

## 2013-10-02 ENCOUNTER — Encounter (HOSPITAL_COMMUNITY): Payer: Self-pay | Admitting: Orthopedic Surgery

## 2013-11-25 ENCOUNTER — Telehealth: Payer: Self-pay | Admitting: Internal Medicine

## 2013-11-25 ENCOUNTER — Other Ambulatory Visit: Payer: Self-pay | Admitting: Internal Medicine

## 2013-11-25 NOTE — Telephone Encounter (Signed)
Called and spoke with pharmacy([facility] [address])-they just needed a refill authorization-No PA needed. I gave verbal refill with 3 additional refills.  LMTCB-to inform patient of this.

## 2013-11-25 NOTE — Telephone Encounter (Signed)
Mr Dorris called back-he is aware that refill has been called in to pharmacy and NO PA needed at this time. Pt states he has a current PA in place for this med and needs it updated as its to run out in the next 60 days. Pt is to bring a copy to me this week or first of next week so I may work on this for him. Nothing more needed at this time.

## 2013-11-26 NOTE — Telephone Encounter (Signed)
I have form and I am working on PA update.

## 2013-12-04 NOTE — Telephone Encounter (Signed)
[name] out of office until 8.10.15 Please advise William Booth of any updates, thanks.

## 2013-12-11 NOTE — Telephone Encounter (Signed)
I called [facility]-PA has been approved form 12-11-13 through 12-12-14; PA# [id number]. I have called and left message for patient to call back so we may let him know of approval.

## 2013-12-15 NOTE — Telephone Encounter (Signed)
Attempted to call pt.  The line was picked up but no one would say anything.  Will try back later.

## 2013-12-16 NOTE — Telephone Encounter (Signed)
LMTCB for pt  Per Joellen Jersey Provigil PA was approved.

## 2013-12-17 NOTE — Telephone Encounter (Signed)
lmtcb x2 

## 2013-12-18 NOTE — Telephone Encounter (Signed)
lmtcb x3 

## 2013-12-21 NOTE — Telephone Encounter (Signed)
lmomtcb for pt  Per triage protocol, will sign off.

## 2013-12-22 ENCOUNTER — Telehealth: Payer: Self-pay | Admitting: Internal Medicine

## 2013-12-22 NOTE — Telephone Encounter (Signed)
Per 11/25/13 phone msg, we were attempting to contact pt to inform him of below:  Lorane Gell, CMA at 12/11/2013 11:38 AM     Status: Signed        I called [facility]-PA has been approved form 12-11-13 through 12-12-14; PA# [id number]. I have called and left message for patient to call back so we may let him know of approval.    -------  Spoke with pt.  Informed him of Provigil PA APPROVAL.  He verbalized understanding and voiced no further questions or concerns at this time.

## 2014-04-12 ENCOUNTER — Encounter: Payer: Self-pay | Admitting: Internal Medicine

## 2014-04-12 ENCOUNTER — Ambulatory Visit: Payer: 59 | Admitting: Internal Medicine

## 2014-04-12 VITALS — BP 118/70 | HR 68 | Ht 70.0 in | Wt 266.8 lb

## 2014-04-12 DIAGNOSIS — G4733 Obstructive sleep apnea (adult) (pediatric): Secondary | ICD-10-CM

## 2014-04-12 MED ORDER — MODAFINIL 200 MG PO TABS: 200.0000 mg | ORAL_TABLET | Freq: Every day | ORAL | Status: DC

## 2014-04-12 NOTE — Patient Instructions (Signed)
We can continue CPAP 12/ Advanced  Script refilled for modafanil  We will download your machine to see how it is doing  Please call if we can help

## 2014-04-12 NOTE — Progress Notes (Signed)
12/03/11- 61 yoM never smoker formerly a patient of my old practice, now seeking to reestablish because of sleep apnea. PCP Dr Raliegh Ip. Rex Kras Complains of daytime sleepiness, falling asleep during meetings and with long driving. He has remained fully compliant with his CPAP 11, getting supplies on line. Bedtime midnight to 1:30 AM with sleep latency less than 15 minutes and not waking until he gets up between 9 and 10 AM. 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 Wife tells him he does not snore through his current CPAP, using nasal pillows mask. Occasionally uses Elavil 100 mg as a sleeping pill only when traveling. He has been very heavy but says he lost over 100 pounds in 4 years. Lab band surgery at 389 pounds, down now around 278 pounds. No ENT surgery. Medical problems include hypertension, diabetes, allergic rhinitis. GERD is controlled with omeprazole.  01/04/12- 27 yoM never smoker formerly a patient of my old practice, now seeking to reestablish because of sleep apnea. PCP Dr Raliegh Ip. Little CPAP 11 Pricilla Larsson. Machine changed out. Download is still pending. Sample Nuvigil 150 helped but was not quite strong enough for residual daytime sleepiness. We discussed medication alternatives, sleep hygiene, naps and expectations from optimal CPAP control.  04/04/12-61 yoM never smoker followed for OSA, hx gastric banding FOLLOWS FOR: wears CPAP 12/ supplies on-line every night for 8-9 hours and pressure of 12 working well for patient. Had umbilical hernia repair with mesh and lap band by Dr. Lucia Gaskins. No respiratory problems during the surgery. CPAP now at 12/ Advanced with new machine Continues Provigil 200 mg daily when needed. Tolerating this well. Try to maintain good sleep hygiene and take occasional nap. Sleeps well.  04/10/13- 46 yoM never smoker followed for OSA, hx gastric banding FOLLOWS FOR: wears CPAP 12/ Advanced every night for about 7-8 hours; pressure is  working well for patient; DME is [facility]. He feels he is doing very well and says quality of life is much better with CPAP. Provigil has been a big help for alertness at work when needed.  04/12/14- 61 yoM never smoker followed for OSA, hx gastric banding FOLLOWS FOR: Wears CPAP every night for about 7-8 hours; DME is [facility]-will need download and enroll in [place]. Also need to send Rx for supplies.   ROS-see HPI Constitutional:   + weight loss, No-night sweats, fevers, chills, fatigue, lassitude. HEENT:   No-  headaches, difficulty swallowing, tooth/dental problems, sore throat,       No-  sneezing, itching, ear ache, nasal congestion, post nasal drip,  CV:  No-   chest pain, orthopnea, PND, swelling in lower extremities, anasarca,  dizziness, palpitations Resp: No-   shortness of breath with exertion or at rest.              No-   productive cough,  No non-productive cough,  No- coughing up of blood.              No-   change in color of mucus.  No- wheezing.   Skin: No-   rash or lesions. GI:  No-   heartburn, indigestion, abdominal pain, nausea, vomiting,  GU: MS:  No-   joint pain or swelling.   Neuro-     nothing unusual Psych:  No- change in mood or affect. No depression or anxiety.  No memory loss.  OBJ- Physical Exam General- Alert, Oriented, Affect-appropriate, Distress- none acute, +remains overweight Skin- rash-none, lesions- none, excoriation-  none Lymphadenopathy- none Head- atraumatic            Eyes- Gross vision intact, PERRLA, conjunctivae and secretions clear            Ears- Hearing, canals-normal            Nose- Clear, no-Septal dev, mucus, polyps, erosion, perforation             Throat- Mallampati III , mucosa clear , drainage- none, tonsils- atrophic Neck- flexible , trachea midline, no stridor , thyroid nl, carotid no bruit Chest - symmetrical excursion , unlabored           Heart/CV- RRR , no murmur , no gallop  , no rub, nl s1 s2                           -  JVD- none , edema- none, stasis changes- none, varices- none           Lung- clear to P&A, wheeze- none, cough- none , dullness-none, rub- none           Chest wall-  Abd-  Br/ Gen/ Rectal- Not done, not indicated Extrem- cyanosis- none, clubbing, none, atrophy- none, strength- nl Neuro- grossly intact to observation

## 2014-05-04 ENCOUNTER — Other Ambulatory Visit: Payer: Self-pay | Admitting: Family Medicine

## 2014-05-04 DIAGNOSIS — R944 Abnormal results of kidney function studies: Secondary | ICD-10-CM

## 2014-05-07 ENCOUNTER — Ambulatory Visit
Admission: RE | Admit: 2014-05-07 | Discharge: 2014-05-07 | Disposition: A | Discharge status: Home / Self Care | Payer: 59 | Source: Ambulatory Visit | Attending: Family Medicine | Admitting: Family Medicine

## 2014-05-07 DIAGNOSIS — R944 Abnormal results of kidney function studies: Secondary | ICD-10-CM

## 2014-10-18 ENCOUNTER — Telehealth: Payer: Self-pay | Admitting: *Deleted

## 2014-10-18 NOTE — Telephone Encounter (Signed)
Form completed and faxed to triage for signature.

## 2014-10-18 NOTE — Telephone Encounter (Signed)
Modafinil was approved from 12-11-13 through 12-12-14; PA# [id number].   Received PA form to complete new PA before it expires. Would you like to proceed?

## 2014-10-18 NOTE — Telephone Encounter (Signed)
Yes, Thanks [name]

## 2014-10-26 NOTE — Telephone Encounter (Signed)
Form was given to CY on 10/19/14 to sign. Was this form ever faxed?

## 2014-10-28 NOTE — Telephone Encounter (Signed)
I received fax stating Modafinil Rx has been approved from 10-23-14 thorough 10-23-15. Pt is aware and he will fill Rx. Nothing more needed at this time.

## 2014-12-01 ENCOUNTER — Other Ambulatory Visit: Payer: Self-pay | Admitting: Internal Medicine

## 2014-12-02 NOTE — Telephone Encounter (Signed)
Ok to refill 

## 2014-12-02 NOTE — Telephone Encounter (Signed)
Allergies  Allergen Reactions  . Acetaminophen Anaphylaxis  . Nsaids Hives and Rash    Face only will spread if not caught early.  Can take ASA    Last 04/12/14 and is to return in 1 year. Pt requesting refill on Provigil 200mg  tabs. May we refill?

## 2014-12-08 ENCOUNTER — Other Ambulatory Visit: Payer: Self-pay | Admitting: Internal Medicine

## 2014-12-09 NOTE — Telephone Encounter (Signed)
Called to pharmacy voicemail; had several attempts to contact pharmacy and phone lines were messed up.

## 2015-03-23 ENCOUNTER — Other Ambulatory Visit: Payer: Self-pay | Admitting: Internal Medicine

## 2015-03-25 NOTE — Telephone Encounter (Signed)
RX called in. Nothing further needed  

## 2015-03-25 NOTE — Telephone Encounter (Signed)
Ok to refill 

## 2015-03-25 NOTE — Telephone Encounter (Signed)
rovigil last refilled 12/09/14 #90. Please advise on refill Dr. Annamaria Boots thanks

## 2015-04-13 ENCOUNTER — Encounter: Payer: Self-pay | Admitting: Internal Medicine

## 2015-04-13 ENCOUNTER — Ambulatory Visit (INDEPENDENT_AMBULATORY_CARE_PROVIDER_SITE_OTHER): Payer: 59 | Admitting: Internal Medicine

## 2015-04-13 VITALS — BP 128/60 | HR 91 | Ht 70.0 in | Wt 270.4 lb

## 2015-04-13 DIAGNOSIS — E669 Obesity, unspecified: Secondary | ICD-10-CM | POA: Diagnosis not present

## 2015-04-13 DIAGNOSIS — G4733 Obstructive sleep apnea (adult) (pediatric): Secondary | ICD-10-CM

## 2015-04-13 NOTE — Progress Notes (Signed)
12/03/11- 64 yoM never smoker formerly a patient of my old practice, now seeking to reestablish because of sleep apnea. PCP Dr Raliegh Ip. Rex Kras Complains of daytime sleepiness, falling asleep during meetings and with long driving. He has remained fully compliant with his CPAP 11, getting supplies on line. Bedtime midnight to 1:30 AM with sleep latency less than 15 minutes and not waking until he gets up between 9 and 10 AM. 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 Wife tells him he does not snore through his current CPAP, using nasal pillows mask. Occasionally uses Elavil 100 mg as a sleeping pill only when traveling. He has been very heavy but says he lost over 100 pounds in 4 years. Lab band surgery at 389 pounds, down now around 278 pounds. No ENT surgery. Medical problems include hypertension, diabetes, allergic rhinitis. GERD is controlled with omeprazole.  01/04/12- 64 yoM never smoker formerly a patient of my old practice, now seeking to reestablish because of sleep apnea. PCP Dr Raliegh Ip. Little CPAP 11 Pricilla Larsson. Machine changed out. Download is still pending. Sample Nuvigil 150 helped but was not quite strong enough for residual daytime sleepiness. We discussed medication alternatives, sleep hygiene, naps and expectations from optimal CPAP control.  04/04/12-64 yoM never smoker followed for OSA, hx gastric banding FOLLOWS FOR: wears CPAP 12/ supplies on-line every night for 8-9 hours and pressure of 12 working well for patient. Had umbilical hernia repair with mesh and lap band by Dr. Lucia Gaskins. No respiratory problems during the surgery. CPAP now at 12/ Advanced with new machine Continues Provigil 200 mg daily when needed. Tolerating this well. Try to maintain good sleep hygiene and take occasional nap. Sleeps well.  04/10/13- 64 yoM never smoker followed for OSA, hx gastric banding FOLLOWS FOR: wears CPAP 12/ Advanced every night for about 7-8 hours; pressure is  working well for patient; DME is [facility]. He feels he is doing very well and says quality of life is much better with CPAP. Provigil has been a big help for alertness at work when needed.  04/12/14- 64 yoM never smoker followed for OSA, hx gastric banding FOLLOWS FOR: Wears CPAP every night for about 7-8 hours; DME is [facility]-will need download and enroll in [place]. Also need to send Rx for supplies.  04/13/2015-64 year old male never smoker followed for OSA, history gastric banding 2007 CPAP 12/Advanced FOLLOWS FOR: Wears CPAP every night for at least 6 hours; DME is [facility]-no recent download. "Can't sleep without CPAP". Comfortable with pressure and mask. Continues occasional modafinil used maybe twice a week for long drives etc. Discussed possible use of a travel size CPAP, and how to talk with home care company about whether he is eligible for new CPAP machine.  ROS-see HPI Constitutional:   + weight loss, No-night sweats, fevers, chills, fatigue, lassitude. HEENT:   No-  headaches, difficulty swallowing, tooth/dental problems, sore throat,       No-  sneezing, itching, ear ache, nasal congestion, post nasal drip,  CV:  No-   chest pain, orthopnea, PND, swelling in lower extremities, anasarca,  dizziness, palpitations Resp: No-   shortness of breath with exertion or at rest.              No-   productive cough,  No non-productive cough,  No- coughing up of blood.              No-   change in color of mucus.  No-  wheezing.   Skin: No-   rash or lesions. GI:  No-   heartburn, indigestion, abdominal pain, nausea, vomiting,  GU: MS:  No-   joint pain or swelling.   Neuro-     nothing unusual Psych:  No- change in mood or affect. No depression or anxiety.  No memory loss.  OBJ- Physical Exam General- Alert, Oriented, Affect-appropriate, Distress- none acute, +remains overweight Skin- rash-none, lesions- none, excoriation- none Lymphadenopathy- none Head- atraumatic            Eyes- Gross vision  intact, PERRLA, conjunctivae and secretions clear            Ears- Hearing, canals-normal            Nose- Clear, no-Septal dev, mucus, polyps, erosion, perforation             Throat- Mallampati III , mucosa clear , drainage- none, tonsils- atrophic Neck- flexible , trachea midline, no stridor , thyroid nl, carotid no bruit Chest - symmetrical excursion , unlabored           Heart/CV- RRR , no murmur , no gallop  , no rub, nl s1 s2                           - JVD- none , edema- none, stasis changes- none, varices- none           Lung- clear to P&A, wheeze- none, cough- none , dullness-none, rub- none           Chest wall-  Abd-  Br/ Gen/ Rectal- Not done, not indicated Extrem- cyanosis- none, clubbing, none, atrophy- none, strength- nl Neuro- grossly intact to observation

## 2015-04-13 NOTE — Patient Instructions (Addendum)
Order- DME Advanced   Continue CPAP 12, mask of choice, humidifier, supplies, AirView     Dx OSA               Evaluate for eligibility for replacement of old machine               Please download for documentation pressure compliance  Please call as needed

## 2015-04-13 NOTE — Assessment & Plan Note (Addendum)
Describes good compliance and control, comfortable at 12 CWP  I asked him to take a chip in so we could get download. If he is due for a replacement machine we will want to add [employer]

## 2015-04-13 NOTE — Assessment & Plan Note (Signed)
There is still significant room for beneficial weight loss

## 2015-09-05 ENCOUNTER — Telehealth: Payer: Self-pay | Admitting: *Deleted

## 2015-09-05 NOTE — Telephone Encounter (Signed)
Any patient assistance available? 

## 2015-09-05 NOTE — Telephone Encounter (Signed)
Provigil approved from 09/05/15 to 09/04/16. Pharmacy informed. Pharmacy states it went through but the price is $665.

## 2015-09-05 NOTE — Telephone Encounter (Signed)
Filled out form and faxed to [facility] for Provigil. Will await response.  PT ID: [id number] Faxed to [phone]

## 2015-09-06 NOTE — Telephone Encounter (Signed)
CY there are several Patient Assistance programs and Drug card companies online but patient will need to register for the programs; its based on his total household income,family size, and insurance.

## 2015-09-07 MED ORDER — AMPHETAMINE-DEXTROAMPHET ER 20 MG PO CP24: 20.0000 mg | ORAL_CAPSULE | Freq: Every day | ORAL | Status: DC

## 2015-09-07 NOTE — Telephone Encounter (Signed)
Spoke with pt and advised of CY's recommendations for med change. Pt is ok to try Adderall. Pt advised he will need to pick up Rx and call monthly for refills. Also advised pt that if he has any problems/side effects from med he needs to contact office. Rx printed for CY to sign. Pt aware to come by tomorrow for pick up. Nothing further needed.

## 2015-09-07 NOTE — Telephone Encounter (Signed)
Insurance doesn't want to cover provigil  Suggest we try Adderall 20 mg, # 30, 1 each morning (explain controlled drug, so we can't mail and he has to call for refills)

## 2015-09-14 ENCOUNTER — Telehealth: Payer: Self-pay | Admitting: *Deleted

## 2015-09-14 NOTE — Telephone Encounter (Signed)
Adderall has been denied.

## 2015-09-14 NOTE — Telephone Encounter (Signed)
Can we get ritalin 10 mg, # 60, 1 or 2 daily for sleepiness ?

## 2015-09-14 NOTE — Telephone Encounter (Signed)
Initiated PA for Adderall thru [facility]. Key: [id number]  Submitted for review.    Pt ID: [id number]   [facility] #[id number] [place], [place] (Phone) [phone] (Fax)

## 2015-09-15 NOTE — Telephone Encounter (Signed)
Submitted PA for Ritalin 10mg  per CY thru [facility]. Key: [id number]   Submitted for review.

## 2015-09-19 MED ORDER — METHYLPHENIDATE HCL ER (LA) 10 MG PO CP24: 10.0000 mg | ORAL_CAPSULE | Freq: Every day | ORAL | Status: DC

## 2015-09-19 NOTE — Telephone Encounter (Signed)
RX printed and given to CY to sign. Please call pt once ready for pick up.

## 2015-09-19 NOTE — Telephone Encounter (Signed)
Ritalin Extended Release 10mg  daily has been approved until 09/19/2018 through pt's insurance. Thanks

## 2015-09-19 NOTE — Telephone Encounter (Signed)
[id number]

## 2015-09-19 NOTE — Telephone Encounter (Signed)
Ok to order Ritalin 10 mg, # 30, 1 daily       That's what his insurance finally approved- see message trail.

## 2015-09-19 NOTE — Telephone Encounter (Signed)
LM on vm that rx is ready for pick up.

## 2015-10-20 ENCOUNTER — Encounter: Payer: Self-pay | Admitting: Internal Medicine

## 2015-10-20 NOTE — Telephone Encounter (Signed)
CY - please advise. Thanks! 

## 2015-10-20 NOTE — Telephone Encounter (Signed)
Ok Rx Adderall  XR 20 mg, # 90,  1 daily (3 month script)            No refill            Please remove methylphenidate from med list

## 2015-10-24 MED ORDER — AMPHETAMINE-DEXTROAMPHET ER 20 MG PO CP24: 20.0000 mg | ORAL_CAPSULE | Freq: Every day | ORAL | Status: DC

## 2015-10-24 NOTE — Addendum Note (Signed)
Addended by: Desmond Dike C on: 10/24/2015 02:11 PM   Modules accepted: Orders, Medications

## 2015-10-26 MED ORDER — AMPHETAMINE-DEXTROAMPHET ER 20 MG PO CP24: 20.0000 mg | ORAL_CAPSULE | Freq: Every day | ORAL | Status: DC

## 2015-10-26 NOTE — Addendum Note (Signed)
Addended by: Osa Craver on: 10/26/2015 05:27 PM   Modules accepted: Orders

## 2015-10-26 NOTE — Telephone Encounter (Signed)
Pt came to office to pick up rx. Pt noticed that rx was for the incorrect medication. Ritalin was printed but pt requested Adderall. We explained to the patient that we will need new RX signed by CY. He voiced understanding and requested a call back with rx is ready for pick up at [phone]. He had no further questions.

## 2015-10-27 NOTE — Telephone Encounter (Signed)
Called spoke with pt. Informed him that Rx for adderall was printed, signed, and placed at the front of the office for pick up. He voiced understanding and had no further questions. RX has been placed at the front of the office. Nothing further needed.

## 2016-03-07 ENCOUNTER — Telehealth: Payer: Self-pay | Admitting: Internal Medicine

## 2016-03-07 MED ORDER — AMPHETAMINE-DEXTROAMPHET ER 20 MG PO CP24: 20.0000 mg | ORAL_CAPSULE | Freq: Every day | ORAL | 0 refills | Status: DC

## 2016-03-07 NOTE — Telephone Encounter (Signed)
Pt requesting generic adderall 20mg  ER. Pt last seen in office on 04-13-15 with an upcoming appt for 04-12-16.  CY please advise on refill. Thanks.

## 2016-03-07 NOTE — Telephone Encounter (Signed)
Spoke with pt, will pick up rx.  Signed rx has been placed in brown accordion file.  Nothing further needed.

## 2016-03-07 NOTE — Telephone Encounter (Signed)
Ok to refill 

## 2016-03-07 NOTE — Telephone Encounter (Signed)
Rx printed and placed on CDY's cart to be signed  Please return to triage when done and we can call pt and see if he wants this mailed or picked up  Thanks

## 2016-03-19 ENCOUNTER — Encounter (HOSPITAL_COMMUNITY): Payer: Self-pay | Admitting: Emergency Medicine

## 2016-03-19 ENCOUNTER — Emergency Department (HOSPITAL_COMMUNITY): Payer: 59

## 2016-03-19 ENCOUNTER — Observation Stay (HOSPITAL_COMMUNITY)
Admission: EM | Admit: 2016-03-19 | Discharge: 2016-03-20 | Disposition: A | Discharge status: Home / Self Care | Payer: 59 | Attending: Internal Medicine | Admitting: Internal Medicine

## 2016-03-19 DIAGNOSIS — J954 Chemical pneumonitis due to anesthesia: Secondary | ICD-10-CM | POA: Insufficient documentation

## 2016-03-19 DIAGNOSIS — E039 Hypothyroidism, unspecified: Secondary | ICD-10-CM | POA: Diagnosis not present

## 2016-03-19 DIAGNOSIS — Z7984 Long term (current) use of oral hypoglycemic drugs: Secondary | ICD-10-CM | POA: Insufficient documentation

## 2016-03-19 DIAGNOSIS — Z9884 Bariatric surgery status: Secondary | ICD-10-CM | POA: Insufficient documentation

## 2016-03-19 DIAGNOSIS — I10 Essential (primary) hypertension: Secondary | ICD-10-CM | POA: Diagnosis not present

## 2016-03-19 DIAGNOSIS — R000 Tachycardia, unspecified: Secondary | ICD-10-CM | POA: Diagnosis not present

## 2016-03-19 DIAGNOSIS — J189 Pneumonia, unspecified organism: Secondary | ICD-10-CM

## 2016-03-19 DIAGNOSIS — N179 Acute kidney failure, unspecified: Secondary | ICD-10-CM | POA: Diagnosis present

## 2016-03-19 DIAGNOSIS — N182 Chronic kidney disease, stage 2 (mild): Secondary | ICD-10-CM | POA: Diagnosis not present

## 2016-03-19 DIAGNOSIS — Y838 Other surgical procedures as the cause of abnormal reaction of the patient, or of later complication, without mention of misadventure at the time of the procedure: Secondary | ICD-10-CM | POA: Diagnosis not present

## 2016-03-19 DIAGNOSIS — F419 Anxiety disorder, unspecified: Secondary | ICD-10-CM | POA: Insufficient documentation

## 2016-03-19 DIAGNOSIS — I129 Hypertensive chronic kidney disease with stage 1 through stage 4 chronic kidney disease, or unspecified chronic kidney disease: Secondary | ICD-10-CM | POA: Insufficient documentation

## 2016-03-19 DIAGNOSIS — G4733 Obstructive sleep apnea (adult) (pediatric): Secondary | ICD-10-CM | POA: Insufficient documentation

## 2016-03-19 DIAGNOSIS — J690 Pneumonitis due to inhalation of food and vomit: Secondary | ICD-10-CM

## 2016-03-19 DIAGNOSIS — A419 Sepsis, unspecified organism: Secondary | ICD-10-CM | POA: Diagnosis present

## 2016-03-19 DIAGNOSIS — Z9989 Dependence on other enabling machines and devices: Secondary | ICD-10-CM | POA: Insufficient documentation

## 2016-03-19 DIAGNOSIS — R6883 Chills (without fever): Secondary | ICD-10-CM | POA: Diagnosis present

## 2016-03-19 DIAGNOSIS — A403 Sepsis due to Streptococcus pneumoniae: Principal | ICD-10-CM | POA: Insufficient documentation

## 2016-03-19 DIAGNOSIS — E1122 Type 2 diabetes mellitus with diabetic chronic kidney disease: Secondary | ICD-10-CM | POA: Diagnosis not present

## 2016-03-19 DIAGNOSIS — E1142 Type 2 diabetes mellitus with diabetic polyneuropathy: Secondary | ICD-10-CM | POA: Insufficient documentation

## 2016-03-19 HISTORY — DX: Type 2 diabetes mellitus with diabetic chronic kidney disease: E11.22

## 2016-03-19 HISTORY — DX: Chronic kidney disease, stage 2 (mild): N18.2

## 2016-03-19 HISTORY — DX: Hypothyroidism, unspecified: E03.9

## 2016-03-19 LAB — COMPREHENSIVE METABOLIC PANEL
ALBUMIN: 4.3 g/dL (ref 3.5–5.0)
ALT: 40 U/L (ref 17–63)
ANION GAP: 10 (ref 5–15)
AST: 46 U/L — ABNORMAL HIGH (ref 15–41)
Alkaline Phosphatase: 70 U/L (ref 38–126)
BUN: 14 mg/dL (ref 6–20)
CO2: 23 mmol/L (ref 22–32)
Calcium: 8.9 mg/dL (ref 8.9–10.3)
Chloride: 106 mmol/L (ref 101–111)
Creatinine, Ser: 1.26 mg/dL — ABNORMAL HIGH (ref 0.61–1.24)
GFR calc non Af Amer: 59 mL/min — ABNORMAL LOW (ref >60)
GLUCOSE: 162 mg/dL — AB (ref 65–99)
POTASSIUM: 4.5 mmol/L (ref 3.5–5.1)
Sodium: 139 mmol/L (ref 135–145)
TOTAL PROTEIN: 7.5 g/dL (ref 6.5–8.1)
Total Bilirubin: 1.1 mg/dL (ref 0.3–1.2)

## 2016-03-19 LAB — I-STAT CG4 LACTIC ACID, ED
Lactic Acid, Venous: 3.71 mmol/L (ref 0.5–1.9)
Lactic Acid, Venous: 3.78 mmol/L (ref 0.5–1.9)

## 2016-03-19 LAB — URINALYSIS, ROUTINE W REFLEX MICROSCOPIC
Bilirubin Urine: NEGATIVE
GLUCOSE, UA: NEGATIVE mg/dL
HGB URINE DIPSTICK: NEGATIVE
Ketones, ur: NEGATIVE mg/dL
Leukocytes, UA: NEGATIVE
Nitrite: NEGATIVE
PH: 5.5 (ref 5.0–8.0)
PROTEIN: NEGATIVE mg/dL
Specific Gravity, Urine: 1.016 (ref 1.005–1.030)

## 2016-03-19 LAB — CBC WITH DIFFERENTIAL/PLATELET
Basophils Absolute: 0 10*3/uL (ref 0.0–0.1)
Basophils Relative: 0 %
EOS ABS: 0.1 10*3/uL (ref 0.0–0.7)
Eosinophils Relative: 1 %
HCT: 46.3 % (ref 39.0–52.0)
HEMOGLOBIN: 15.7 g/dL (ref 13.0–17.0)
LYMPHS PCT: 10 %
Lymphs Abs: 1 10*3/uL (ref 0.7–4.0)
MCH: 29.7 pg (ref 26.0–34.0)
MCHC: 33.9 g/dL (ref 30.0–36.0)
MCV: 87.5 fL (ref 78.0–100.0)
Monocytes Absolute: 0.1 10*3/uL (ref 0.1–1.0)
Monocytes Relative: 1 %
NEUTROS PCT: 88 %
Neutro Abs: 8.7 10*3/uL — ABNORMAL HIGH (ref 1.7–7.7)
PLATELETS: 194 10*3/uL (ref 150–400)
RBC: 5.29 MIL/uL (ref 4.22–5.81)
RDW: 14 % (ref 11.5–15.5)
WBC: 9.9 10*3/uL (ref 4.0–10.5)

## 2016-03-19 LAB — GLUCOSE, CAPILLARY: Glucose-Capillary: 110 mg/dL — ABNORMAL HIGH (ref 65–99)

## 2016-03-19 MED ORDER — SODIUM CHLORIDE 0.9 % IV SOLN
1.5000 g | Freq: Four times a day (QID) | INTRAVENOUS | Status: DC
  Administered 2016-03-19 – 2016-03-20 (×3): 1.5 g via INTRAVENOUS
  Filled 2016-03-19 (×3): qty 1.5

## 2016-03-19 MED ORDER — LEVOTHYROXINE SODIUM 50 MCG PO TABS: 350.0000 ug | ORAL_TABLET | Freq: Every evening | ORAL | Status: DC

## 2016-03-19 MED ORDER — ONDANSETRON HCL 4 MG/2ML IJ SOLN
4.0000 mg | Freq: Once | INTRAMUSCULAR | Status: AC
  Administered 2016-03-19: 4 mg via INTRAVENOUS
  Filled 2016-03-19: qty 2

## 2016-03-19 MED ORDER — DEXTROSE 5 % IV SOLN
1.0000 g | Freq: Once | INTRAVENOUS | Status: AC
  Administered 2016-03-19: 1 g via INTRAVENOUS
  Filled 2016-03-19: qty 10

## 2016-03-19 MED ORDER — SODIUM CHLORIDE 0.9 % IV BOLUS (SEPSIS): 1000.0000 mL | Freq: Once | INTRAVENOUS | Status: DC

## 2016-03-19 MED ORDER — SODIUM CHLORIDE 0.9 % IV BOLUS (SEPSIS)
1000.0000 mL | Freq: Once | INTRAVENOUS | Status: AC
  Administered 2016-03-19 (×2): 1000 mL via INTRAVENOUS

## 2016-03-19 MED ORDER — INSULIN ASPART 100 UNIT/ML ~~LOC~~ SOLN: 0.0000 [IU] | Freq: Three times a day (TID) | SUBCUTANEOUS | Status: DC

## 2016-03-19 MED ORDER — ASPIRIN 325 MG PO TABS
325.0000 mg | ORAL_TABLET | Freq: Once | ORAL | Status: AC
  Administered 2016-03-19: 325 mg via ORAL
  Filled 2016-03-19: qty 1

## 2016-03-19 MED ORDER — INSULIN ASPART 100 UNIT/ML ~~LOC~~ SOLN: 0.0000 [IU] | Freq: Every day | SUBCUTANEOUS | Status: DC

## 2016-03-19 MED ORDER — ONDANSETRON HCL 4 MG/2ML IJ SOLN
4.0000 mg | Freq: Four times a day (QID) | INTRAMUSCULAR | Status: DC | PRN
Start: 2016-03-19 — End: 2016-03-20

## 2016-03-19 MED ORDER — SODIUM CHLORIDE 0.9 % IV SOLN
INTRAVENOUS | Status: DC
  Administered 2016-03-19 – 2016-03-20 (×2): via INTRAVENOUS

## 2016-03-19 MED ORDER — ASPIRIN 325 MG PO TABS
325.0000 mg | ORAL_TABLET | Freq: Four times a day (QID) | ORAL | Status: DC | PRN
  Administered 2016-03-20: 325 mg via ORAL
  Filled 2016-03-19: qty 1

## 2016-03-19 MED ORDER — SACCHAROMYCES BOULARDII 250 MG PO CAPS
250.0000 mg | ORAL_CAPSULE | Freq: Two times a day (BID) | ORAL | Status: DC
  Administered 2016-03-19 – 2016-03-20 (×2): 250 mg via ORAL
  Filled 2016-03-19 (×2): qty 1

## 2016-03-19 MED ORDER — DEXTROSE 5 % IV SOLN
500.0000 mg | Freq: Once | INTRAVENOUS | Status: AC
  Administered 2016-03-19: 500 mg via INTRAVENOUS
  Filled 2016-03-19: qty 500

## 2016-03-19 MED ORDER — SODIUM CHLORIDE 0.9 % IV BOLUS (SEPSIS): 500.0000 mL | Freq: Once | INTRAVENOUS | Status: DC

## 2016-03-19 NOTE — ED Notes (Signed)
Patient had a total of 3,000 of NS. Spoke with ED PA, she states 3,032mL is enough fluids. Will not infuse other septic fluid orders.

## 2016-03-19 NOTE — ED Notes (Addendum)
Patient has been made aware of pending urine sample. Patient was given a urinal and told to call out when sample is ready.

## 2016-03-19 NOTE — ED Notes (Signed)
Transported to radiology 

## 2016-03-19 NOTE — ED Triage Notes (Signed)
Per EMS pt brought from home c/o chills, generalized body aches and emesis onset today. Pt Dr Cristina Gong called stating pt has colonoscopy this am and did very well in recovery and was sent home. Dr wondering if possible aspiration during procedure. En route pt given 4mg  zofran IM with no relief.

## 2016-03-19 NOTE — ED Notes (Signed)
Gave report to [facility] for assigned room 1502.

## 2016-03-19 NOTE — Progress Notes (Signed)
(  Courtesy visit--no charge)  ER staff's management much appreciated.  Notes, CXR, and labs reviewed.  Pt appears to have had an acute septic reaction in association with presumed aspiration pneumonia--he regurgitated slightly during his colonoscopy this morning, and had some coughing thereafter and rhonchi at left base in recovery, but looked fine prior to discharge from our endoscopy unit.  Then within a couple of hours had acute reaction:  Nausea, vomiting, chills, muscle twitching/discomfort, and some coughing.    At present, he is lying comfortably on ER stretcher, NAD, no resp distress, HR 100, sBP 102.  Insp crackles at left lung base, no wheezes or rhonchi at present.   No c/o abd pain, and no tenderness on exam.  WE WILL FOLLOW SOCIALLY but please call us if input from [place] is needed.  Cleotis Nipper, M.D. Pager [phone] If no answer or after 5 PM call [phone]

## 2016-03-19 NOTE — H&P (Signed)
History and Physical:    William Booth   [id number] DOB: [birth year] DOA: 03/19/2016  Referring MD/provider: Recardo Evangelist, PA-C PCP: Gennette Pac, MD   Patient coming from: Home  Chief Complaint: Fever, chills, myalgias  History of Present Illness:   William Booth is an 65 y.o. male with a PMH of DM II managed with oral hypoglycemics, well controlled HTN, obesity s/p laparoscopic banding, and OSA on nocturnal CPAP who had a colonoscopy today and vomited while under sedation. When he got home, he felt diaphoretic with chills/fever, myalgias, headache, nausea. He subsequently presented to the ED for further evaluation.  ED Course:  The patient had a chest x-ray which showed left basilar airspace disease. He was hemodynamically stable but had an elevated serum lactate of 3.71. He was afebrile. He was given 3.5 L of IV fluids and placed on empiric Rocephin/azithromycin.  ROS:   Review of Systems  Constitutional: Positive for chills and fever.  HENT: Positive for sore throat. Negative for congestion and sinus pain.   Eyes: Negative.   Respiratory: Positive for cough and shortness of breath. Negative for hemoptysis and sputum production.   Cardiovascular: Negative for chest pain and palpitations.  Gastrointestinal: Positive for nausea and vomiting. Negative for blood in stool, diarrhea, heartburn and melena.  Genitourinary: Negative.   Musculoskeletal: Positive for myalgias.  Skin: Negative.   Neurological: Positive for headaches.  Endo/Heme/Allergies: Does not bruise/bleed easily.  Psychiatric/Behavioral: Negative.     All other systems were reviewed are are negative. Past Medical History:   Past Medical History:  Diagnosis Date  . Anxiety   . Diabetes mellitus    type 2  . GERD (gastroesophageal reflux disease)   . Hypertension   . Hypothyroidism   . Low HDL (under 40)   . Narcolepsy   . Peripheral neuropathy (HCC)    not taking any meds at this  time  . Sleep apnea    uses CPAP, last sleep study 1999, had titration 11/2011  . Type II diabetes mellitus with stage 2 chronic kidney disease ([place]) 03/19/2016  . Umbilical hernia     Past Surgical History:   Past Surgical History:  Procedure Laterality Date  . broken finger     right hand  . CARDIAC CATHETERIZATION  06/2009  . CLOSED REDUCTION FINGER WITH PERCUTANEOUS PINNING Left 09/30/2013   Procedure: LEFT SMALL FINGER METACARPAL CLOSED REDUCTION WITH PERCUTANEOUS PINNING;  Surgeon: Linna Hoff, MD;  Location: [facility];  Service: Orthopedics;  Laterality: Left;  . COLONOSCOPY    . GASTRIC RESTRICTION SURGERY  02/18/06   lap band  . TOOTH EXTRACTION  02/28/12  . UMBILICAL HERNIA REPAIR  03/20/2012   Procedure: LAPAROSCOPIC UMBILICAL HERNIA;  Surgeon: Shann Medal, MD;  Location: [facility];  Service: General;  Laterality: N/A;    Social History:   Social History   Social History  . Marital status: Married    Spouse name: Belenda Cruise  . Number of children: 1  . Years of education: 75   Occupational History  . [employer] Systems Analyst    Social History Main Topics  . Smoking status: Never Smoker  . Smokeless tobacco: Never Used  . Alcohol use No  . Drug use: No  . Sexual activity: Not on file   Other Topics Concern  . Not on file   Social History Narrative  . No narrative on file    Allergies   Acetaminophen and Nsaids  Family history:  Family History  Problem Relation Age of Onset  . Heart failure Mother   . CAD Father   . Diabetes Brother     Current Medications:   Prior to Admission medications   Medication Sig Start Date End Date Taking? Authorizing Provider  ALPRAZolam Duanne Moron) 0.5 MG tablet Take 0.5 mg by mouth 2 (two) times daily as needed for anxiety.  02/16/11  Yes Historical Provider, MD  amitriptyline (ELAVIL) 100 MG tablet Take 100 mg by mouth at bedtime as needed for sleep.  02/14/11  Yes Historical Provider, MD  atenolol (TENORMIN) 50 MG tablet  Take 50 mg by mouth 2 (two) times daily.   Yes Historical Provider, MD  buPROPion (WELLBUTRIN XL) 150 MG 24 hr tablet Take 150 mg by mouth daily.    Yes Historical Provider, MD  Cholecalciferol (VITAMIN D-3) 5000 units TABS Take 1 tablet by mouth daily.   Yes Historical Provider, MD  escitalopram (LEXAPRO) 10 MG tablet Take 10 mg by mouth daily. 03/01/16  Yes Historical Provider, MD  fenofibrate 160 MG tablet Take 160 mg by mouth daily.   Yes Historical Provider, MD  GAVILYTE-N WITH FLAVOR PACK 420 g solution Take 4,000 mLs by mouth once.  03/17/16  Yes Historical Provider, MD  glimepiride (AMARYL) 4 MG tablet Take 4 mg by mouth 2 (two) times daily.   Yes Historical Provider, MD  metFORMIN (GLUCOPHAGE) 500 MG tablet Take 1,000 mg by mouth 2 (two) times daily. 03/07/16  Yes Historical Provider, MD  omega-3 acid ethyl esters (LOVAZA) 1 G capsule Take 1 g by mouth daily.  06/29/13  Yes Historical Provider, MD  rosuvastatin (CRESTOR) 40 MG tablet Take 40 mg by mouth daily. 03/01/16  Yes Historical Provider, MD  vitamin B-12 (CYANOCOBALAMIN) 1000 MCG tablet Take 1,000 mcg by mouth daily.   Yes Historical Provider, MD  amphetamine-dextroamphetamine (ADDERALL XR) 20 MG 24 hr capsule Take 1 capsule (20 mg total) by mouth daily. Patient not taking: Reported on 03/19/2016 10/24/15   Deneise Lever, MD  amphetamine-dextroamphetamine (ADDERALL XR) 20 MG 24 hr capsule Take 1 capsule (20 mg total) by mouth daily. Patient not taking: Reported on 03/19/2016 03/07/16   Deneise Lever, MD  ezetimibe (ZETIA) 10 MG tablet Take 10 mg by mouth daily. 01/17/16   Historical Provider, MD  FLUoxetine (PROZAC) 20 MG capsule Take 20 mg by mouth at bedtime.    Historical Provider, MD  levothyroxine (SYNTHROID, LEVOTHROID) 175 MCG tablet Take 350 mcg by mouth every evening.    Historical Provider, MD  losartan-hydrochlorothiazide (HYZAAR) 100-25 MG per tablet Take 1 tablet by mouth daily.      Historical Provider, MD  rosuvastatin  (CRESTOR) 20 MG tablet Take 20 mg by mouth daily.    Historical Provider, MD    Physical Exam:   Vitals:   03/19/16 1815 03/19/16 1830 03/19/16 1845 03/19/16 1914  BP: 108/67 102/65 100/66   Pulse: 96 101 100   Resp: 21 22 15    Temp:    99.5 F (37.5 C)  TempSrc:    Oral  SpO2: 96% 94% 96%   Weight:      Height:         Physical Exam: Blood pressure 100/66, pulse 100, temperature 99.5 F (37.5 C), temperature source Oral, resp. rate 15, height 5\' 10"  (1.778 m), weight 115.7 kg (255 lb), SpO2 96 %. Gen: No acute distress. Head: Normocephalic, atraumatic. Eyes: Pupils equal, round and reactive to light. Extraocular movements intact.  Sclerae nonicteric.  No lid lag. Mouth: Oropharynx reveals mildly dry mucous membranes. Dentition is fair, with no obvious caries or dental inflammation. Neck: Supple, no thyromegaly, no lymphadenopathy, no jugular venous distention. Chest: Lungs are clear to auscultation with good air movement. No rales, rhonchi or wheezes.  CV: Heart sounds are regular with an S1, S2. Tachycardic. No murmurs, rubs, clicks, or gallops.  Abdomen: Soft, nontender, nondistended with normal active bowel sounds. No hepatosplenomegaly or palpable masses. Extremities: Extremities are without clubbing, edema, or cyanosis. Pedal pulses 2+.  Skin: Warm and dry. No rashes, lesions or wounds. Face is flushed. Neuro: Alert and oriented times 3; grossly nonfocal.  Psych: Insight is good and judgment is appropriate. Mood and affect normal.   Data Review:    Labs: Basic Metabolic Panel:  Recent Labs Lab 03/19/16 1515  NA 139  K 4.5  CL 106  CO2 23  GLUCOSE 162*  BUN 14  CREATININE 1.26*  CALCIUM 8.9   Liver Function Tests:  Recent Labs Lab 03/19/16 1515  AST 46*  ALT 40  ALKPHOS 70  BILITOT 1.1  PROT 7.5  ALBUMIN 4.3   CBC:  Recent Labs Lab 03/19/16 1515  WBC 9.9  NEUTROABS 8.7*  HGB 15.7  HCT 46.3  MCV 87.5  PLT 194    Urinalysis      Component Value Date/Time   COLORURINE YELLOW 03/19/2016 [place] 03/19/2016 1758   LABSPEC 1.016 03/19/2016 1758   PHURINE 5.5 03/19/2016 1758   GLUCOSEU NEGATIVE 03/19/2016 1758   HGBUR NEGATIVE 03/19/2016 1758   BILIRUBINUR NEGATIVE 03/19/2016 1758   KETONESUR NEGATIVE 03/19/2016 1758   PROTEINUR NEGATIVE 03/19/2016 1758   NITRITE NEGATIVE 03/19/2016 1758   LEUKOCYTESUR NEGATIVE 03/19/2016 1758      Radiographic Studies: Dg Abdomen Acute W/chest  Result Date: 03/19/2016 CLINICAL DATA:  [place] EXAM: DG ABDOMEN ACUTE W/ 1V CHEST COMPARISON:  None. FINDINGS: There is patchy airspace disease at the left lung base. Right lung is clear. Normal heart size. Low volumes. No pneumothorax. No disproportionate dilatation of bowel. No free intraperitoneal gas on the left decubitus image. Gastric lap band apparatus is noted. It is oriented at 2 o'clock. The catheter is continuous. The reservoir is in the right upper quadrant. Phleboliths project over the pelvis. IMPRESSION: Left basilar airspace disease suggesting pneumonia. Nonobstructive bowel gas pattern. Electronically Signed   By: Marybelle Killings M.D.   On: 03/19/2016 16:09    EKG: Independently reviewed. Sinus tachycardia at 109 bpm.   Assessment/Plan:   Principal Problem:   Sepsis due to aspiration pneumonia ([place]) in the setting of vomiting while under sedation We'll discontinue prior antibiotics and treat with Unasyn. IV antibiotics needed at this time given recent history of nausea/vomiting. Blood cultures obtained. Obtain sputum culture and strep pneumonia antigen. Status post 3.5 L bolus in the ED with reassessment showing stable blood pressure but ongoing tachypnea/tachycardia. Lactic acid declining. Aspirin ordered as needed for fever. Zofran ordered as needed for nausea/vomiting.  Active Problems:   Obstructive sleep apnea Continue CPAP daily at bedtime.    Type 2 diabetes mellitus (HCC) Hold metformin. Treat  with moderate scale SSI.    Stage II-III chronic kidney disease GFR consistent with usual baseline values. Baseline creatinine appears to be around 1.2.    Essential hypertension Blood pressures soft, hold all antihypertensives.    Hypothyroidism Continue Synthroid.  Other information:   DVT prophylaxis: SCDs ordered. Code Status: Full code. Family Communication: Wife at the bedside.  Disposition Plan:  Likely home tomorrow if blood pressure stable and antibiotics able to be switched to oral. Consults called: None. Admission status: Observation.  The medical decision making on this patient was of high complexity and the patient is at high risk for clinical deterioration, therefore this is a level 3 visit.  Tiyona Desouza [facility] Pager [phone] Cell: [phone]   If 7PM-7AM, please contact night-coverage [email or web address] Password [email or web address] 03/19/2016, 7:21 PM

## 2016-03-19 NOTE — ED Notes (Signed)
Unable to obtain a second IV. Lanelle Bal, RN is at bedside attempting an ultrasound IV. Altha Harm, RN Hydrographic surveyor) attempted twice with no success previously.

## 2016-03-19 NOTE — ED Provider Notes (Signed)
[facility] DEPT Provider Note   CSN: [id number] Arrival date & time: 03/19/16  1428     History   Chief Complaint Chief Complaint  Patient presents with  . Chills  . Generalized Body Aches    HPI William Booth is a 65 y.o. male who presents with chills, generalized body aches. PMH significant for Type 2 DM, GERD, HTN, sleep apnea, umbilical hernia, obesity. Past surgical hx significant for lap band surgery, umbilical hernia repair. He was in his usual state of health this morning. He had a screening colonoscopy today by Dr. Cristina Gong who states that the patient did have some emesis during the procedure and possibly aspirated. The procedure overall went well and he was discharged home. He went home with his wife and went to drink water and eat something when started feeling ill. He had an acute onset of diaphoresis, chills, generalized body aches, headache, cough, and N/V/D. Dr. Cristina Gong had prescribed them antibiotics but they did not have a chance to have them filled. EMS was called and he was given Zofran IM. He states that currently he is feeling better than when he was at home. Denies chest pain, SOB, abdominal pain, melena/hematochezia, dysuria.  HPI  Past Medical History:  Diagnosis Date  . Anxiety   . Diabetes mellitus    type 2  . GERD (gastroesophageal reflux disease)   . Hypertension   . Low HDL (under 40)   . Narcolepsy   . Peripheral neuropathy (HCC)    not taking any meds at this time  . Sleep apnea    uses CPAP, last sleep study 1999, had titration 11/2011  . Umbilical hernia     Patient Active Problem List   Diagnosis Date Noted  . Obesity 04/13/2015  . History of laparoscopic adjustable gastric banding, 02/18/2006. 02/21/2012  . Umbilical hernia [id number]  . Obstructive sleep apnea 12/08/2011    Past Surgical History:  Procedure Laterality Date  . broken finger     right hand  . CARDIAC CATHETERIZATION  06/2009  . CLOSED REDUCTION FINGER WITH  PERCUTANEOUS PINNING Left 09/30/2013   Procedure: LEFT SMALL FINGER METACARPAL CLOSED REDUCTION WITH PERCUTANEOUS PINNING;  Surgeon: Linna Hoff, MD;  Location: [facility];  Service: Orthopedics;  Laterality: Left;  . COLONOSCOPY    . GASTRIC RESTRICTION SURGERY  02/18/06   lap band  . TOOTH EXTRACTION  02/28/12  . UMBILICAL HERNIA REPAIR  03/20/2012   Procedure: LAPAROSCOPIC UMBILICAL HERNIA;  Surgeon: Shann Medal, MD;  Location: [facility];  Service: General;  Laterality: N/A;       Home Medications    Prior to Admission medications   Medication Sig Start Date End Date Taking? Authorizing Provider  ALPRAZolam Duanne Moron) 0.5 MG tablet Take 0.5 mg by mouth 2 (two) times daily as needed for anxiety.  02/16/11   Historical Provider, MD  amitriptyline (ELAVIL) 100 MG tablet Take 100 mg by mouth at bedtime as needed for sleep.  02/14/11   Historical Provider, MD  amphetamine-dextroamphetamine (ADDERALL XR) 20 MG 24 hr capsule Take 1 capsule (20 mg total) by mouth daily. 10/24/15   Deneise Lever, MD  amphetamine-dextroamphetamine (ADDERALL XR) 20 MG 24 hr capsule Take 1 capsule (20 mg total) by mouth daily. 03/07/16   Deneise Lever, MD  atenolol (TENORMIN) 50 MG tablet Take 50 mg by mouth 2 (two) times daily.    Historical Provider, MD  buPROPion (WELLBUTRIN XL) 150 MG 24 hr tablet Take 150 mg by mouth  daily.     Historical Provider, MD  escitalopram (LEXAPRO) 10 MG tablet Take 10 mg by mouth daily. 03/01/16   Historical Provider, MD  ezetimibe (ZETIA) 10 MG tablet Take 10 mg by mouth daily. 01/17/16   Historical Provider, MD  fenofibrate 160 MG tablet Take 160 mg by mouth daily.    Historical Provider, MD  FLUoxetine (PROZAC) 20 MG capsule Take 20 mg by mouth at bedtime.    Historical Provider, MD  GAVILYTE-N WITH FLAVOR PACK 420 g solution  03/17/16   Historical Provider, MD  glimepiride (AMARYL) 4 MG tablet Take 4 mg by mouth 2 (two) times daily.    Historical Provider, MD  levothyroxine  (SYNTHROID, LEVOTHROID) 175 MCG tablet Take 350 mcg by mouth every evening.    Historical Provider, MD  losartan-hydrochlorothiazide (HYZAAR) 100-25 MG per tablet Take 1 tablet by mouth daily.      Historical Provider, MD  metFORMIN (GLUCOPHAGE) 1000 MG tablet Take 1,000 mg by mouth 2 (two) times daily with a meal.      Historical Provider, MD  metFORMIN (GLUCOPHAGE) 500 MG tablet Take 1,000 mg by mouth 2 (two) times daily. 03/07/16   Historical Provider, MD  omega-3 acid ethyl esters (LOVAZA) 1 G capsule Take 1 g by mouth daily.  06/29/13   Historical Provider, MD  rosuvastatin (CRESTOR) 20 MG tablet Take 20 mg by mouth daily.    Historical Provider, MD  rosuvastatin (CRESTOR) 40 MG tablet Take 40 mg by mouth daily. 03/01/16   Historical Provider, MD    Family History No family history on file.  Social History Social History  Substance Use Topics  . Smoking status: Never Smoker  . Smokeless tobacco: Never Used  . Alcohol use No     Allergies   Acetaminophen and Nsaids   Review of Systems Review of Systems  Constitutional: Positive for chills and diaphoresis. Negative for fever.  Respiratory: Positive for cough. Negative for shortness of breath and wheezing.   Cardiovascular: Negative for chest pain.  Gastrointestinal: Positive for diarrhea, nausea and vomiting. Negative for abdominal pain and blood in stool.  Genitourinary: Negative for difficulty urinating, dysuria and flank pain.  Musculoskeletal: Positive for myalgias (generalized).  Neurological: Positive for headaches.  All other systems reviewed and are negative.    Physical Exam Updated Vital Signs BP 146/77 (BP Location: Left Arm)   Pulse 103   Temp 100.2 F (37.9 C) (Oral)   Resp 22   Ht 5\' 9"  (1.753 m)   Wt 113.4 kg   SpO2 95%   BMI 36.92 kg/m   Physical Exam  Constitutional: He is oriented to person, place, and time. He appears well-developed and well-nourished. No distress.  Obese, NAD  HENT:  Head:  Normocephalic and atraumatic.  Eyes: Conjunctivae are normal. Pupils are equal, round, and reactive to light. Right eye exhibits no discharge. Left eye exhibits no discharge. No scleral icterus.  Neck: Normal range of motion.  Cardiovascular: Tachycardia present.  Exam reveals no gallop and no friction rub.   No murmur heard. Split S1  Pulmonary/Chest: Effort normal and breath sounds normal. No respiratory distress. He has no wheezes. He has no rales. He exhibits no tenderness.  Abdominal: Soft. Bowel sounds are normal. He exhibits no distension and no mass. There is no tenderness. There is no rebound and no guarding. No hernia.  RUQ lap band scar noted  Neurological: He is alert and oriented to person, place, and time.  Skin: Skin is warm and  dry.  Psychiatric: He has a normal mood and affect. His behavior is normal.  Nursing note and vitals reviewed.    ED Treatments / Results  Labs (all labs ordered are listed, but only abnormal results are displayed) Labs Reviewed  COMPREHENSIVE METABOLIC PANEL - Abnormal; Notable for the following:       Result Value   Glucose, Bld 162 (*)    Creatinine, Ser 1.26 (*)    AST 46 (*)    GFR calc non Af Amer 59 (*)    All other components within normal limits  CBC WITH DIFFERENTIAL/PLATELET - Abnormal; Notable for the following:    Neutro Abs 8.7 (*)    All other components within normal limits  GLUCOSE, CAPILLARY - Abnormal; Notable for the following:    Glucose-Capillary 110 (*)    All other components within normal limits  I-STAT CG4 LACTIC ACID, ED - Abnormal; Notable for the following:    Lactic Acid, Venous 3.71 (*)    All other components within normal limits  I-STAT CG4 LACTIC ACID, ED - Abnormal; Notable for the following:    Lactic Acid, Venous 3.78 (*)    All other components within normal limits  CULTURE, BLOOD (ROUTINE X 2)  CULTURE, BLOOD (ROUTINE X 2)  URINE CULTURE  CULTURE, EXPECTORATED SPUTUM-ASSESSMENT  GRAM STAIN    URINALYSIS, ROUTINE W REFLEX MICROSCOPIC (NOT AT [facility])  STREP PNEUMONIAE URINARY ANTIGEN    EKG  EKG Interpretation None     ED ECG REPORT   Date: 03/19/2016  Rate: 109  Rhythm: sinus tachycardia  QRS Axis: normal  Intervals: normal  ST/T Wave abnormalities: normal  Conduction Disutrbances:none  Narrative Interpretation:   Old EKG Reviewed: unchanged other than sinus tachycardia  I have personally reviewed the EKG tracing and agree with the computerized printout as noted.  Radiology Dg Abdomen Acute W/chest  Result Date: 03/19/2016 CLINICAL DATA:  Chills EXAM: DG ABDOMEN ACUTE W/ 1V CHEST COMPARISON:  None. FINDINGS: There is patchy airspace disease at the left lung base. Right lung is clear. Normal heart size. Low volumes. No pneumothorax. No disproportionate dilatation of bowel. No free intraperitoneal gas on the left decubitus image. Gastric lap band apparatus is noted. It is oriented at 2 o'clock. The catheter is continuous. The reservoir is in the right upper quadrant. Phleboliths project over the pelvis. IMPRESSION: Left basilar airspace disease suggesting pneumonia. Nonobstructive bowel gas pattern. Electronically Signed   By: Marybelle Killings M.D.   On: 03/19/2016 16:09    Procedures Procedures (including critical care time)  Medications Ordered in ED Medications  levothyroxine (SYNTHROID, LEVOTHROID) tablet 350 mcg (350 mcg Oral Not Given 03/19/16 2130)  0.9 %  sodium chloride infusion ( Intravenous New Bag/Given 03/19/16 2216)  ampicillin-sulbactam (UNASYN) 1.5 g in sodium chloride 0.9 % 50 mL IVPB (1.5 g Intravenous Given 03/19/16 2215)  insulin aspart (novoLOG) injection 0-15 Units (not administered)  insulin aspart (novoLOG) injection 0-5 Units (0 Units Subcutaneous Not Given 03/19/16 2200)  aspirin tablet 325 mg (325 mg Oral Given 03/20/16 0006)  ondansetron (ZOFRAN) injection 4 mg (not administered)  saccharomyces boulardii (FLORASTOR) capsule 250 mg (250 mg  Oral Given 03/19/16 2215)  cefTRIAXone (ROCEPHIN) 1 g in dextrose 5 % 50 mL IVPB (0 g Intravenous Stopped 03/19/16 1738)  azithromycin (ZITHROMAX) 500 mg in dextrose 5 % 250 mL IVPB (0 mg Intravenous Stopped 03/19/16 1909)  sodium chloride 0.9 % bolus 1,000 mL (0 mLs Intravenous Stopped 03/19/16 1909)    And  sodium chloride 0.9 % bolus 1,000 mL (0 mLs Intravenous Stopped 03/19/16 1909)  aspirin tablet 325 mg (325 mg Oral Given 03/19/16 1921)  ondansetron (ZOFRAN) injection 4 mg (4 mg Intravenous Given 03/19/16 1959)     Initial Impression / Assessment and Plan / ED Course  I have reviewed the triage vital signs and the nursing notes.  Pertinent labs & imaging results that were available during my care of the patient were reviewed by me and considered in my medical decision making (see chart for details).  Clinical Course    65 year old male with sepsis due to aspiration pneumonia. He is borderline febrile with Tmax of 100.2 in the ED. He is tachycardic and has soft pressures but is not hypoxic. CBC unremarkable. CMP remarkable for baseline creatinine of 1.26, hyperglycemia of 162. Lactic acid is elevated at 3.71 and repeat is 3.78. Chest x-ray remarkable for a left lower lobe pneumonia.  Code sepsis called. Blood cultures obtained. Fluids and antibiotics started. Spoke with Dr. Rockne Menghini with hospitalist service will admit patient for observation. Appreciate assistance.  Also spoke with Dr. Cristina Gong to update him on status of patient.  Final Clinical Impressions(s) / ED Diagnoses   Final diagnoses:  Aspiration pneumonia of left lower lobe, unspecified aspiration pneumonia type [facility])    New Prescriptions Current Discharge Medication List       Recardo Evangelist, PA-C 03/20/16 0012    Lacretia Leigh, MD 03/21/16 1018

## 2016-03-20 DIAGNOSIS — A419 Sepsis, unspecified organism: Secondary | ICD-10-CM | POA: Diagnosis not present

## 2016-03-20 DIAGNOSIS — J189 Pneumonia, unspecified organism: Secondary | ICD-10-CM | POA: Diagnosis not present

## 2016-03-20 LAB — COMPREHENSIVE METABOLIC PANEL
ALT: 27 U/L (ref 17–63)
AST: 28 U/L (ref 15–41)
Albumin: 3.4 g/dL — ABNORMAL LOW (ref 3.5–5.0)
Alkaline Phosphatase: 50 U/L (ref 38–126)
Anion gap: 6 (ref 5–15)
BUN: 12 mg/dL (ref 6–20)
CHLORIDE: 106 mmol/L (ref 101–111)
CO2: 26 mmol/L (ref 22–32)
Calcium: 8.3 mg/dL — ABNORMAL LOW (ref 8.9–10.3)
Creatinine, Ser: 1.18 mg/dL (ref 0.61–1.24)
Glucose, Bld: 113 mg/dL — ABNORMAL HIGH (ref 65–99)
POTASSIUM: 3.9 mmol/L (ref 3.5–5.1)
SODIUM: 138 mmol/L (ref 135–145)
Total Bilirubin: 1 mg/dL (ref 0.3–1.2)
Total Protein: 6.3 g/dL — ABNORMAL LOW (ref 6.5–8.1)

## 2016-03-20 LAB — CBC WITH DIFFERENTIAL/PLATELET
BASOS ABS: 0 10*3/uL (ref 0.0–0.1)
Basophils Relative: 0 %
EOS ABS: 0.1 10*3/uL (ref 0.0–0.7)
EOS PCT: 0 %
HCT: 38.6 % — ABNORMAL LOW (ref 39.0–52.0)
Hemoglobin: 13 g/dL (ref 13.0–17.0)
LYMPHS ABS: 2.3 10*3/uL (ref 0.7–4.0)
LYMPHS PCT: 11 %
MCH: 29.6 pg (ref 26.0–34.0)
MCHC: 33.7 g/dL (ref 30.0–36.0)
MCV: 87.9 fL (ref 78.0–100.0)
MONO ABS: 1.5 10*3/uL — AB (ref 0.1–1.0)
Monocytes Relative: 7 %
Neutro Abs: 17.4 10*3/uL — ABNORMAL HIGH (ref 1.7–7.7)
Neutrophils Relative %: 82 %
PLATELETS: 179 10*3/uL (ref 150–400)
RBC: 4.39 MIL/uL (ref 4.22–5.81)
RDW: 14.3 % (ref 11.5–15.5)
WBC: 21.3 10*3/uL — AB (ref 4.0–10.5)

## 2016-03-20 LAB — STREP PNEUMONIAE URINARY ANTIGEN: STREP PNEUMO URINARY ANTIGEN: NEGATIVE

## 2016-03-20 LAB — LACTIC ACID, PLASMA: LACTIC ACID, VENOUS: 1.5 mmol/L (ref 0.5–1.9)

## 2016-03-20 LAB — GLUCOSE, CAPILLARY: GLUCOSE-CAPILLARY: 91 mg/dL (ref 65–99)

## 2016-03-20 MED ORDER — DIPHENHYDRAMINE HCL 50 MG/ML IJ SOLN
25.0000 mg | Freq: Once | INTRAMUSCULAR | Status: AC
  Administered 2016-03-20: 25 mg via INTRAVENOUS
  Filled 2016-03-20: qty 1

## 2016-03-20 MED ORDER — AMOXICILLIN-POT CLAVULANATE 875-125 MG PO TABS: 1.0000 | ORAL_TABLET | Freq: Two times a day (BID) | ORAL | 0 refills | Status: AC

## 2016-03-20 MED ORDER — BUPROPION HCL ER (XL) 150 MG PO TB24: 300.0000 mg | ORAL_TABLET | Freq: Every day | ORAL | Status: DC

## 2016-03-20 MED ORDER — SACCHAROMYCES BOULARDII 250 MG PO CAPS: 250.0000 mg | ORAL_CAPSULE | Freq: Two times a day (BID) | ORAL | 0 refills | Status: DC

## 2016-03-20 MED ORDER — METOCLOPRAMIDE HCL 5 MG/ML IJ SOLN
10.0000 mg | Freq: Once | INTRAMUSCULAR | Status: AC
  Administered 2016-03-20: 10 mg via INTRAVENOUS
  Filled 2016-03-20: qty 2

## 2016-03-20 MED ORDER — METFORMIN HCL 500 MG PO TABS: 1000.0000 mg | ORAL_TABLET | Freq: Two times a day (BID) | ORAL | 0 refills | Status: DC

## 2016-03-20 NOTE — Progress Notes (Signed)
PHARMACY NOTE:  PRIOR TO ADMISSION MED LIST  Was asked to visit patient and discuss levothyroxine on PTA med list, which was marked as "not taking" but ordered as inpatient.  Patient states he has never been on levothyroxine or other "thyroid" medication.  He doesn't know why this was on his medication list and asked that it be removed.  Have removed from medication list and updated Dr. Karleen Hampshire, attending MD.   Clayburn Pert, PharmD, BCPS Pager: [phone] 03/20/2016  7:40 AM

## 2016-03-20 NOTE — Progress Notes (Signed)
(  Social visit--no charge)  Pt had a fairly good night except headache.   Not coughing, no shortness of breath.  Is currently eating breakfast.  Pt looks well and is in no evident distress, respiratory or otherwise.  (Not examined)  Labs were not ordered for this morning--so I have ordered.  [facility] w/ Dr. Rockne Menghini last night, and she indicated it was ok for to write any orders I saw fit.)  Have also ordered activity--up in chair and ambulate with assistance.  IMPR:  Resolving sepsis associated w/ aspiration pneumonia as complication of colonoscopy yesterday.  Please call me prn if you would like to discuss case or if GI service can be of any assistance.  Cleotis Nipper, M.D. Pager [phone] If no answer or after 5 PM call [phone]

## 2016-03-20 NOTE — Progress Notes (Signed)
Date: March 20, 2016 Discharge orders checked for needs. No case management needs present at time of discharge. Velva Harman, RN, BSN, [place]   [phone]

## 2016-03-20 NOTE — Progress Notes (Signed)
Patient given discharge instructions, and verbalized an understanding of all discharge instructions.  Patient agrees with discharge plan, and is being discharged in stable medical condition.  

## 2016-03-21 LAB — URINE CULTURE: Culture: NO GROWTH

## 2016-03-24 LAB — CULTURE, BLOOD (ROUTINE X 2)
CULTURE: NO GROWTH
Culture: NO GROWTH

## 2016-03-27 NOTE — Discharge Summary (Signed)
Physician Discharge Summary  [name] DOB: 1950-10-13 DOA: 03/19/2016  PCP: Gennette Pac, MD  Admit date: 03/19/2016 Discharge date: 03/20/2016  Admitted From: Home.  Disposition:  HOme.   Recommendations for Outpatient Follow-up:  1. Follow up with PCP in 1-2 weeks 2. Please obtain BMP/CBC in one week 3. Please follow up with Dr Cristina Gong for biopsy results.  4. Please follow up with a repeat CXR for resolution of the pneumonia.     Discharge Condition:stable.  CODE STATUS:full code.  Diet recommendation: Heart Healthy  Brief/Interim Summary: William Booth is an 65 y.o. male with a PMH of DM II managed with oral hypoglycemics, well controlled HTN, obesity s/p laparoscopic banding, and OSA on nocturnal CPAP who had a colonoscopy today and vomited while under sedation. When he got home, he felt diaphoretic with chills/fever, myalgias, headache, nausea. He subsequently presented to the ED for further evaluation. He was found to have aspiration pneumonia.   Discharge Diagnoses:  Principal Problem:   Sepsis due to pneumonia [facility]) Active Problems:   Obstructive sleep apnea   Type II diabetes mellitus with stage 2 chronic kidney disease ([place])   Essential hypertension   Aspiration pneumonia of left lower lobe (HCC)   Hypothyroidism    Sepsis due to aspiration pneumonia ([place]) in the setting of vomiting while under sedation Much improved.  Lactic acid normal after fluids.  IV unasyn to po augment on discharge. Pt adamant about discharge. Recommend outpatient follow up with CXR in 4 weeks for resolution.   Active Problems:   Obstructive sleep apnea Continue CPAP daily at bedtime.    Type 2 diabetes mellitus ([place]) Resume home meds.     Stage II-III chronic kidney disease GFR consistent with usual baseline values. Baseline creatinine appears to be around 1.2.    Essential hypertension Well controlled. Resume home meds on discharge.    Hypothyroidism Continue Synthroid.   Discharge Instructions  Discharge Instructions    Diet - low sodium heart healthy    Complete by:  As directed    Diet Carb Modified    Complete by:  As directed    Discharge instructions    Complete by:  As directed    Follow up with PCP in one week, before the antibiotics are done.  Please get a CXR in 3 weeks to evaluate for resolution of the pneumonia.       Medication List    STOP taking these medications   amphetamine-dextroamphetamine 20 MG 24 hr capsule Commonly known as:  ADDERALL XR   ezetimibe 10 MG tablet Commonly known as:  ZETIA   FLUoxetine 20 MG capsule Commonly known as:  PROZAC   losartan-hydrochlorothiazide 100-25 MG tablet Commonly known as:  HYZAAR     TAKE these medications   ALPRAZolam 0.5 MG tablet Commonly known as:  XANAX Take 0.5 mg by mouth 2 (two) times daily as needed for anxiety.   amitriptyline 100 MG tablet Commonly known as:  ELAVIL Take 100 mg by mouth at bedtime as needed for sleep.   amoxicillin-clavulanate 875-125 MG tablet Commonly known as:  AUGMENTIN Take 1 tablet by mouth 2 (two) times daily.   atenolol 50 MG tablet Commonly known as:  TENORMIN Take 50 mg by mouth 2 (two) times daily.   buPROPion 150 MG 24 hr tablet Commonly known as:  WELLBUTRIN XL Take 2 tablets (300 mg total) by mouth daily. What changed:  how much to take   escitalopram 10 MG tablet Commonly  known as:  LEXAPRO Take 10 mg by mouth daily.   fenofibrate 160 MG tablet Take 160 mg by mouth daily.   GAVILYTE-N WITH FLAVOR PACK 420 g solution Generic drug:  polyethylene glycol-electrolytes Take 4,000 mLs by mouth once.   glimepiride 4 MG tablet Commonly known as:  AMARYL Take 4 mg by mouth 2 (two) times daily.   metFORMIN 500 MG tablet Commonly known as:  GLUCOPHAGE Take 2 tablets (1,000 mg total) by mouth 2 (two) times daily.   omega-3 acid ethyl esters 1 g capsule Commonly known as:  LOVAZA Take  1 g by mouth daily.   rosuvastatin 40 MG tablet Commonly known as:  CRESTOR Take 40 mg by mouth daily. What changed:  Another medication with the same name was removed. Continue taking this medication, and follow the directions you see here.   saccharomyces boulardii 250 MG capsule Commonly known as:  FLORASTOR Take 1 capsule (250 mg total) by mouth 2 (two) times daily.   vitamin B-12 1000 MCG tablet Commonly known as:  CYANOCOBALAMIN Take 1,000 mcg by mouth daily.   Vitamin D-3 5000 units Tabs Take 1 tablet by mouth daily.      Follow-up Information    Gennette Pac, MD. Schedule an appointment as soon as possible for a visit in 1 week(s).   Specialty:  Family Medicine Contact information: [place] [address] [phone]          Allergies  Allergen Reactions  . Acetaminophen Anaphylaxis  . Nsaids Hives and Rash    Face only will spread if not caught early.  Can take ASA    Consultations:  gastroenterology   Procedures/Studies: Dg Abdomen Acute W/chest  Result Date: 03/19/2016 CLINICAL DATA:  Chills EXAM: DG ABDOMEN ACUTE W/ 1V CHEST COMPARISON:  None. FINDINGS: There is patchy airspace disease at the left lung base. Right lung is clear. Normal heart size. Low volumes. No pneumothorax. No disproportionate dilatation of bowel. No free intraperitoneal gas on the left decubitus image. Gastric lap band apparatus is noted. It is oriented at 2 o'clock. The catheter is continuous. The reservoir is in the right upper quadrant. Phleboliths project over the pelvis. IMPRESSION: Left basilar airspace disease suggesting pneumonia. Nonobstructive bowel gas pattern. Electronically Signed   By: Marybelle Killings M.D.   On: 03/19/2016 16:09       Subjective:   Discharge Exam: Vitals:   03/19/16 2055 03/20/16 0518  BP: (!) 119/98 119/63  Pulse: (!) 120 63  Resp: 20 16  Temp: 98.4 F (36.9 C) 98.6 F (37 C)   Vitals:   03/19/16 1914 03/19/16  2003 03/19/16 2055 03/20/16 0518  BP:  114/67 (!) 119/98 119/63  Pulse:  103 (!) 120 63  Resp:  20 20 16   Temp: 99.5 F (37.5 C) 100.1 F (37.8 C) 98.4 F (36.9 C) 98.6 F (37 C)  TempSrc: Oral Oral Oral Oral  SpO2:  95% 99% 95%  Weight:      Height:        General: Pt is alert, awake, not in acute distress Cardiovascular: RRR, S1/S2 +, no rubs, no gallops Respiratory: CTA bilaterally, no wheezing, no rhonchi Abdominal: Soft, NT, ND, bowel sounds + Extremities: no edema, no cyanosis    The results of significant diagnostics from this hospitalization (including imaging, microbiology, ancillary and laboratory) are listed below for reference.     Microbiology: Recent Results (from the past 240 hour(s))  Culture, blood (Routine x 2)  Status: None   Collection Time: 03/19/16  3:10 PM  Result Value Ref Range Status   Specimen Description RIGHT ANTECUBITAL  Final   Special Requests BOTTLES DRAWN AEROBIC AND ANAEROBIC 5CC  Final   Culture   Final    NO GROWTH 5 DAYS Performed at [facility]    Report Status 03/24/2016 FINAL  Final  Culture, blood (Routine x 2)     Status: None   Collection Time: 03/19/16  3:15 PM  Result Value Ref Range Status   Specimen Description LEFT ANTECUBITAL  Final   Special Requests BOTTLES DRAWN AEROBIC AND ANAEROBIC 5CC  Final   Culture   Final    NO GROWTH 5 DAYS Performed at [facility]    Report Status 03/24/2016 FINAL  Final  Urine culture     Status: None   Collection Time: 03/19/16  5:58 PM  Result Value Ref Range Status   Specimen Description URINE, CLEAN CATCH  Final   Special Requests NONE  Final   Culture NO GROWTH Performed at [facility]   Final   Report Status 03/21/2016 FINAL  Final     Labs: BNP (last 3 results) No results for input(s): BNP in the last 8760 hours. Basic Metabolic Panel:  Recent Labs Lab 03/20/16 0907  NA 138  K 3.9  CL 106  CO2 26  GLUCOSE 113*  BUN 12  CREATININE  1.18  CALCIUM 8.3*   Liver Function Tests:  Recent Labs Lab 03/20/16 0907  AST 28  ALT 27  ALKPHOS 50  BILITOT 1.0  PROT 6.3*  ALBUMIN 3.4*   No results for input(s): LIPASE, AMYLASE in the last 168 hours. No results for input(s): AMMONIA in the last 168 hours. CBC:  Recent Labs Lab 03/20/16 0907  WBC 21.3*  NEUTROABS 17.4*  HGB 13.0  HCT 38.6*  MCV 87.9  PLT 179   Cardiac Enzymes: No results for input(s): CKTOTAL, CKMB, CKMBINDEX, TROPONINI in the last 168 hours. BNP: Invalid input(s): POCBNP CBG: No results for input(s): GLUCAP in the last 168 hours. D-Dimer No results for input(s): DDIMER in the last 72 hours. Hgb A1c No results for input(s): HGBA1C in the last 72 hours. Lipid Profile No results for input(s): CHOL, HDL, LDLCALC, TRIG, CHOLHDL, LDLDIRECT in the last 72 hours. Thyroid function studies No results for input(s): TSH, T4TOTAL, T3FREE, THYROIDAB in the last 72 hours.  Invalid input(s): FREET3 Anemia work up No results for input(s): VITAMINB12, FOLATE, FERRITIN, TIBC, IRON, RETICCTPCT in the last 72 hours. Urinalysis    Component Value Date/Time   COLORURINE YELLOW 03/19/2016 1758   APPEARANCEUR CLEAR 03/19/2016 1758   LABSPEC 1.016 03/19/2016 1758   PHURINE 5.5 03/19/2016 1758   GLUCOSEU NEGATIVE 03/19/2016 1758   HGBUR NEGATIVE 03/19/2016 1758   BILIRUBINUR NEGATIVE 03/19/2016 1758   KETONESUR NEGATIVE 03/19/2016 1758   PROTEINUR NEGATIVE 03/19/2016 1758   NITRITE NEGATIVE 03/19/2016 1758   LEUKOCYTESUR NEGATIVE 03/19/2016 1758   Sepsis Labs Invalid input(s): PROCALCITONIN,  WBC,  LACTICIDVEN Microbiology Recent Results (from the past 240 hour(s))  Culture, blood (Routine x 2)     Status: None   Collection Time: 03/19/16  3:10 PM  Result Value Ref Range Status   Specimen Description RIGHT ANTECUBITAL  Final   Special Requests BOTTLES DRAWN AEROBIC AND ANAEROBIC 5CC  Final   Culture   Final    NO GROWTH 5 DAYS Performed at [facility]    Report Status 03/24/2016 FINAL  Final  Culture, blood (Routine x 2)     Status: None   Collection Time: 03/19/16  3:15 PM  Result Value Ref Range Status   Specimen Description LEFT ANTECUBITAL  Final   Special Requests BOTTLES DRAWN AEROBIC AND ANAEROBIC 5CC  Final   Culture   Final    NO GROWTH 5 DAYS Performed at [facility]    Report Status 03/24/2016 FINAL  Final  Urine culture     Status: None   Collection Time: 03/19/16  5:58 PM  Result Value Ref Range Status   Specimen Description URINE, CLEAN CATCH  Final   Special Requests NONE  Final   Culture NO GROWTH Performed at [facility]   Final   Report Status 03/21/2016 FINAL  Final     Time coordinating discharge: Over 30 minutes  SIGNED:   Hosie Poisson, MD  [facility] 03/27/2016, 8:49 AM Pager   If 7PM-7AM, please contact night-coverage [email or web address] Password [email or web address]

## 2016-04-12 ENCOUNTER — Encounter: Payer: Self-pay | Admitting: Internal Medicine

## 2016-04-12 ENCOUNTER — Ambulatory Visit (INDEPENDENT_AMBULATORY_CARE_PROVIDER_SITE_OTHER): Payer: 59 | Admitting: Internal Medicine

## 2016-04-12 DIAGNOSIS — G4733 Obstructive sleep apnea (adult) (pediatric): Secondary | ICD-10-CM

## 2016-04-12 MED ORDER — AMPHETAMINE-DEXTROAMPHET ER 20 MG PO CP24: 20.0000 mg | ORAL_CAPSULE | Freq: Every day | ORAL | 0 refills | Status: DC

## 2016-04-12 NOTE — Progress Notes (Signed)
HPI  male never smoker followed for OSA, history gastric banding [id number], complicated by HBP, GERD  04/13/2015-65 year old male never smoker followed for OSA, history gastric banding 2007 CPAP 12/Advanced FOLLOWS FOR: Wears CPAP every night for at least 6 hours; DME is [facility]-no recent download. "Can't sleep without CPAP". Comfortable with pressure and mask. Continues occasional modafinil used maybe twice a week for long drives etc. Discussed possible use of a travel size CPAP, and how to talk with home care company about whether he is eligible for new CPAP machine.  04/12/2016-65 year old male never smoker followed for OSA, history gastric banding [id number], complicated by HBP, GERD Acute hospital 1120-03/20/2016 aspiration pneumonia complicated his colonoscopy under sedation-treated Augmentin CPAP 12/ Advanced FOLLOWS FOR: DME [facility]. Pt wears CPAP nightly for about 6-9 hours; no new supplies needed, Not in AV and no SD card.  CXR 03/19/2016-left basilar airspace disease suggesting pneumonia Dr Rex Kras PCP doing f/u CXR. He states he never sleeps w/o CPAP and pressure is comfortable. Residual daytime sleepiness. Asks increase Adderall - using 20 mgXR, once daily.No concerns about miss-use or diversion.  ROS-see HPI Constitutional:   + weight loss, No-night sweats, fevers, chills, fatigue, lassitude. HEENT:   No-  headaches, difficulty swallowing, tooth/dental problems, sore throat,       No-  sneezing, itching, ear ache, nasal congestion, post nasal drip,  CV:  No-   chest pain, orthopnea, PND, swelling in lower extremities, anasarca,  dizziness, palpitations Resp: No-   shortness of breath with exertion or at rest.              No-   productive cough,  No non-productive cough,  No- coughing up of blood.              No-   change in color of mucus.  No- wheezing.   Skin: No-   rash or lesions. GI:  No-   heartburn, indigestion, abdominal pain, nausea, vomiting,  GU: MS:  No-   joint pain or  swelling.   Neuro-     nothing unusual Psych:  No- change in mood or affect. No depression or anxiety.  No memory loss.  OBJ- Physical Exam General- Alert, Oriented, Affect-appropriate, Distress- none acute, +overweight Skin- rash-none, lesions- none, excoriation- none Lymphadenopathy- none Head- atraumatic            Eyes- Gross vision intact, PERRLA, conjunctivae and secretions clear            Ears- Hearing, canals-normal            Nose- Clear, no-Septal dev, mucus, polyps, erosion, perforation             Throat- Mallampati III , mucosa clear , drainage- none, tonsils- atrophic Neck- flexible , trachea midline, no stridor , thyroid nl, carotid no bruit Chest - symmetrical excursion , unlabored           Heart/CV- RRR , no murmur , no gallop  , no rub, nl s1 s2                           - JVD- none , edema- none, stasis changes- none, varices- none           Lung- clear to P&A, wheeze- none, cough- none , dullness-none, rub- none           Chest wall-  Abd-  Br/ Gen/ Rectal- Not done, not indicated Extrem- cyanosis- none, clubbing, none, atrophy- none,  strength- nl Neuro- grossly intact to observation

## 2016-04-12 NOTE — Patient Instructions (Addendum)
We can continue CPAP Advanced 12, mask of choice, humidifier, supplies  Order- DME [facility]- please send pressure compliance download  Ok to continue Adderall 20 XR   1 daily for excessive sleepiness, if needed.      Take a nap if you can.  Please call if we can help

## 2016-04-14 NOTE — Assessment & Plan Note (Signed)
He will follow this up with his PCP, but clinically he has largely resolved.

## 2016-04-14 NOTE — Assessment & Plan Note (Signed)
We need to see download report to help assess his residual daytime sleepiness.

## 2016-10-05 ENCOUNTER — Telehealth: Payer: Self-pay | Admitting: Internal Medicine

## 2016-10-05 MED ORDER — AMPHETAMINE-DEXTROAMPHET ER 20 MG PO CP24: 20.0000 mg | ORAL_CAPSULE | Freq: Every day | ORAL | 0 refills | Status: DC

## 2016-10-05 NOTE — Telephone Encounter (Signed)
Pt aware that Adderall Rx is ready for pick up.  Printed, signed and placed up front in folder.  Nothing further needed.

## 2016-10-05 NOTE — Telephone Encounter (Signed)
Ok to refill 

## 2016-10-05 NOTE — Telephone Encounter (Signed)
Pt is requesting a refill on Adderall XR 20mg . Last OV with CY was on 04/12/2016. Has pending OV with CY on 04/12/2017. Last refill was on 04/12/2016 #90.  CY - please advise on refill. Thanks.

## 2017-01-10 ENCOUNTER — Telehealth: Payer: Self-pay | Admitting: Internal Medicine

## 2017-01-10 MED ORDER — AMPHETAMINE-DEXTROAMPHET ER 30 MG PO CP24: 30.0000 mg | ORAL_CAPSULE | Freq: Every day | ORAL | 0 refills | Status: DC

## 2017-01-10 NOTE — Telephone Encounter (Signed)
Advised pt Rx for 30mg  Xr will be ready on Monday since we are closed tomorrow. Pt understood and agreed. Nothing further is needed. Will place on CY desk for him to sign on mOnday.

## 2017-01-10 NOTE — Telephone Encounter (Signed)
Ok to increase Dose to Adderall 30 mg XR once daily, # 90

## 2017-01-10 NOTE — Telephone Encounter (Signed)
Called pt to get more details on why he wanted increase his medication. He states he takes his medication at 6am it wears off by 1pm. It is not really effective when he was taking the in the beginning and states he takes it most days but not every day. He states he will come in before his December appt if he needs to but just wanted to see if the dose can be increased first. He would like a 90  days of the Rx either way. Please advise CY..   Last OV 03/2016 Last Rx:   amphetamine-dextroamphetamine (ADDERALL XR) 20 MG 24 hr capsule [[id number]]  Order Details  Dose: 20 mg Route: Oral Frequency: Daily  Dispense Quantity:  90 capsule Refills:  0 Fills remaining:  --        Sig: Take 1 capsule (20 mg total) by mouth daily.       Written Date:  10/05/16 Expiration Date:  04/03/17    Start Date:  10/05/16 End Date:  --         Ordering Provider:  Deneise Lever, MD DEA #:  [id number] NPI:  [id number]   Authorizing Provider:  Deneise Lever, MD DEA #:  [id number]       Scheduled Meds: Continuous Infusions: PRN Meds:.  Allergies  Allergen Reactions  . Acetaminophen Anaphylaxis  . Nsaids Hives and Rash    Face only will spread if not caught early.  Can take ASA

## 2017-01-15 ENCOUNTER — Telehealth: Payer: Self-pay | Admitting: Internal Medicine

## 2017-01-15 MED ORDER — AMPHETAMINE-DEXTROAMPHET ER 30 MG PO CP24: 30.0000 mg | ORAL_CAPSULE | Freq: Every day | ORAL | 0 refills | Status: DC

## 2017-01-15 NOTE — Telephone Encounter (Signed)
Per Jonelle Sidle, patient has been given his RX. Will close this message.

## 2017-01-15 NOTE — Addendum Note (Signed)
Addended by: Jannette Spanner on: 01/15/2017 03:38 PM   Modules accepted: Orders

## 2017-04-12 ENCOUNTER — Ambulatory Visit (INDEPENDENT_AMBULATORY_CARE_PROVIDER_SITE_OTHER): Payer: 59 | Admitting: Internal Medicine

## 2017-04-12 ENCOUNTER — Encounter: Payer: Self-pay | Admitting: Internal Medicine

## 2017-04-12 DIAGNOSIS — G4733 Obstructive sleep apnea (adult) (pediatric): Secondary | ICD-10-CM | POA: Diagnosis not present

## 2017-04-12 MED ORDER — AMPHETAMINE-DEXTROAMPHET ER 30 MG PO CP24: 30.0000 mg | ORAL_CAPSULE | Freq: Every day | ORAL | 0 refills | Status: DC

## 2017-04-12 NOTE — Progress Notes (Signed)
HPI  male never smoker followed for OSA, history gastric banding 7209, complicated by HBP, GERD   -----------------------------------------------------  04/12/2016-66 year old male never smoker followed for OSA, history gastric banding 4709, complicated by HBP, GERD Acute hospital 1120-03/20/2016 aspiration pneumonia complicated his colonoscopy under sedation-treated Augmentin CPAP 12/ Advanced FOLLOWS FOR: DME [facility]. Pt wears CPAP nightly for about 6-9 hours; no new supplies needed, Not in AV and no SD card.  CXR 03/19/2016-left basilar airspace disease suggesting pneumonia Dr Rex Kras PCP doing f/u CXR. He states he never sleeps w/o CPAP and pressure is comfortable. Residual daytime sleepiness. Asks increase Adderall - using 20 mgXR, once daily.No concerns about miss-use or diversion.  04/12/17- -66 year old male never smoker followed for OSA, history gastric banding 6283, complicated by HBP, GERD, DM 2, hypothyroid, bariatric lap band surgery 2007, HBP ---OSA; DME: [facility]. Pt wears CPAP nightly and DL attached. No new supplies needed at this time. Pt will need  CPAP 12/Advanced Rx printed for Aderall XR.  Adderall XR 30 mg, bupropion XL 150 mg, Lexapro 10 mg Download compliance 86%,, AHI 0.3/hour.  Pulse mask off sometimes in his sleep but feels he cannot sleep without it.  Still needs outer row to function well in the daytime-discussed. Taking a retirement Charity fundraiser.  ROS-see HPI + = positive Constitutional:    weight loss, No-night sweats, fevers, chills, fatigue, lassitude. HEENT:   No-  headaches, difficulty swallowing, tooth/dental problems, sore throat,       No-  sneezing, itching, ear ache, nasal congestion, post nasal drip,  CV:  No-   chest pain, orthopnea, PND, swelling in lower extremities, anasarca,  dizziness, palpitations Resp: No-   shortness of breath with exertion or at rest.              No-   productive cough,  No non-productive cough,  No- coughing up of blood.       No-   change in color of mucus.  No- wheezing.   Skin: No-   rash or lesions. GI:  No-   heartburn, indigestion, abdominal pain, nausea, vomiting,  GU: MS:  No-   joint pain or swelling.   Neuro-     nothing unusual Psych:  No- change in mood or affect. No depression or anxiety.  No memory loss.  OBJ- Physical Exam General- Alert, Oriented, Affect-appropriate, Distress- none acute, +overweight Skin- rash-none, lesions- none, excoriation- none Lymphadenopathy- none Head- atraumatic            Eyes- Gross vision intact, PERRLA, conjunctivae and secretions clear            Ears- Hearing, canals-normal            Nose- Clear, no-Septal dev, mucus, polyps, erosion, perforation             Throat- Mallampati III , mucosa clear , drainage- none, tonsils- atrophic Neck- flexible , trachea midline, no stridor , thyroid nl, carotid no bruit Chest - symmetrical excursion , unlabored           Heart/CV- RRR , no murmur , no gallop  , no rub, nl s1 s2                           - JVD- none , edema- none, stasis changes- none, varices- none           Lung- clear to P&A, wheeze- none, cough- none , dullness-none, rub- none  Chest wall-  Abd-  Br/ Gen/ Rectal- Not done, not indicated Extrem- cyanosis- none, clubbing, none, atrophy- none, strength- nl Neuro- grossly intact to observation

## 2017-04-12 NOTE — Patient Instructions (Signed)
We can continue CPAP 12, mask of choice, humidifier, supplies, AirView     Script printed to refill Adderall  Please call if we can help

## 2017-04-14 NOTE — Assessment & Plan Note (Signed)
He had previous bariatric surgery but remains significantly overweight.  Encouragement offered.

## 2017-04-14 NOTE — Assessment & Plan Note (Signed)
Benefit from CPAP and download confirms good compliance and control.  Some residual daytime sleepiness is addressed with Adderall when needed.  He is following instructions and tolerating Adderall very well.

## 2017-05-24 ENCOUNTER — Encounter (HOSPITAL_BASED_OUTPATIENT_CLINIC_OR_DEPARTMENT_OTHER): Payer: Self-pay | Admitting: Emergency Medicine

## 2017-05-24 ENCOUNTER — Other Ambulatory Visit: Payer: Self-pay

## 2017-05-24 ENCOUNTER — Emergency Department (HOSPITAL_BASED_OUTPATIENT_CLINIC_OR_DEPARTMENT_OTHER): Payer: 59

## 2017-05-24 ENCOUNTER — Emergency Department (HOSPITAL_BASED_OUTPATIENT_CLINIC_OR_DEPARTMENT_OTHER)
Admission: EM | Admit: 2017-05-24 | Discharge: 2017-05-24 | Disposition: A | Discharge status: Home / Self Care | Payer: 59 | Attending: Emergency Medicine | Admitting: Emergency Medicine

## 2017-05-24 DIAGNOSIS — R1032 Left lower quadrant pain: Secondary | ICD-10-CM | POA: Diagnosis not present

## 2017-05-24 DIAGNOSIS — R109 Unspecified abdominal pain: Secondary | ICD-10-CM | POA: Diagnosis present

## 2017-05-24 DIAGNOSIS — Z79899 Other long term (current) drug therapy: Secondary | ICD-10-CM | POA: Insufficient documentation

## 2017-05-24 DIAGNOSIS — E1140 Type 2 diabetes mellitus with diabetic neuropathy, unspecified: Secondary | ICD-10-CM | POA: Insufficient documentation

## 2017-05-24 DIAGNOSIS — R112 Nausea with vomiting, unspecified: Secondary | ICD-10-CM | POA: Insufficient documentation

## 2017-05-24 DIAGNOSIS — Z7984 Long term (current) use of oral hypoglycemic drugs: Secondary | ICD-10-CM | POA: Diagnosis not present

## 2017-05-24 DIAGNOSIS — K5900 Constipation, unspecified: Secondary | ICD-10-CM | POA: Insufficient documentation

## 2017-05-24 DIAGNOSIS — I129 Hypertensive chronic kidney disease with stage 1 through stage 4 chronic kidney disease, or unspecified chronic kidney disease: Secondary | ICD-10-CM | POA: Diagnosis not present

## 2017-05-24 DIAGNOSIS — E1122 Type 2 diabetes mellitus with diabetic chronic kidney disease: Secondary | ICD-10-CM | POA: Diagnosis not present

## 2017-05-24 DIAGNOSIS — N182 Chronic kidney disease, stage 2 (mild): Secondary | ICD-10-CM | POA: Diagnosis not present

## 2017-05-24 DIAGNOSIS — E039 Hypothyroidism, unspecified: Secondary | ICD-10-CM | POA: Diagnosis not present

## 2017-05-24 DIAGNOSIS — N200 Calculus of kidney: Secondary | ICD-10-CM | POA: Diagnosis not present

## 2017-05-24 LAB — COMPREHENSIVE METABOLIC PANEL
ALBUMIN: 4.3 g/dL (ref 3.5–5.0)
ALK PHOS: 59 U/L (ref 38–126)
ALT: 29 U/L (ref 17–63)
AST: 32 U/L (ref 15–41)
Anion gap: 11 (ref 5–15)
BUN: 27 mg/dL — ABNORMAL HIGH (ref 6–20)
CALCIUM: 9.3 mg/dL (ref 8.9–10.3)
CO2: 23 mmol/L (ref 22–32)
Chloride: 104 mmol/L (ref 101–111)
Creatinine, Ser: 1.9 mg/dL — ABNORMAL HIGH (ref 0.61–1.24)
GFR calc Af Amer: 41 mL/min — ABNORMAL LOW (ref >60)
GFR calc non Af Amer: 36 mL/min — ABNORMAL LOW (ref >60)
GLUCOSE: 128 mg/dL — AB (ref 65–99)
POTASSIUM: 4.9 mmol/L (ref 3.5–5.1)
SODIUM: 138 mmol/L (ref 135–145)
Total Bilirubin: 0.6 mg/dL (ref 0.3–1.2)
Total Protein: 7.8 g/dL (ref 6.5–8.1)

## 2017-05-24 LAB — CBC WITH DIFFERENTIAL/PLATELET
Basophils Absolute: 0 10*3/uL (ref 0.0–0.1)
Basophils Relative: 0 %
Eosinophils Absolute: 0 10*3/uL (ref 0.0–0.7)
Eosinophils Relative: 0 %
HCT: 41.7 % (ref 39.0–52.0)
Hemoglobin: 14 g/dL (ref 13.0–17.0)
LYMPHS PCT: 10 %
Lymphs Abs: 1.3 10*3/uL (ref 0.7–4.0)
MCH: 30.1 pg (ref 26.0–34.0)
MCHC: 33.6 g/dL (ref 30.0–36.0)
MCV: 89.7 fL (ref 78.0–100.0)
MONO ABS: 1.1 10*3/uL — AB (ref 0.1–1.0)
MONOS PCT: 8 %
Neutro Abs: 11.4 10*3/uL — ABNORMAL HIGH (ref 1.7–7.7)
Neutrophils Relative %: 82 %
Platelets: 198 10*3/uL (ref 150–400)
RBC: 4.65 MIL/uL (ref 4.22–5.81)
RDW: 14.1 % (ref 11.5–15.5)
WBC: 13.8 10*3/uL — ABNORMAL HIGH (ref 4.0–10.5)

## 2017-05-24 LAB — URINALYSIS, MICROSCOPIC (REFLEX)

## 2017-05-24 LAB — URINALYSIS, ROUTINE W REFLEX MICROSCOPIC
BILIRUBIN URINE: NEGATIVE
Glucose, UA: NEGATIVE mg/dL
Ketones, ur: NEGATIVE mg/dL
Leukocytes, UA: NEGATIVE
Nitrite: NEGATIVE
PH: 5.5 (ref 5.0–8.0)
Protein, ur: NEGATIVE mg/dL
Specific Gravity, Urine: 1.025 (ref 1.005–1.030)

## 2017-05-24 LAB — LIPASE, BLOOD: Lipase: 35 U/L (ref 11–51)

## 2017-05-24 MED ORDER — DOCUSATE SODIUM 100 MG PO CAPS: 100.0000 mg | ORAL_CAPSULE | Freq: Two times a day (BID) | ORAL | 0 refills | Status: DC

## 2017-05-24 MED ORDER — MORPHINE SULFATE (PF) 4 MG/ML IV SOLN
4.0000 mg | Freq: Once | INTRAVENOUS | Status: AC
  Administered 2017-05-24: 4 mg via INTRAVENOUS
  Filled 2017-05-24: qty 1

## 2017-05-24 MED ORDER — SODIUM CHLORIDE 0.9 % IV BOLUS (SEPSIS)
500.0000 mL | Freq: Once | INTRAVENOUS | Status: AC
  Administered 2017-05-24: 500 mL via INTRAVENOUS

## 2017-05-24 MED ORDER — ONDANSETRON HCL 4 MG/2ML IJ SOLN
4.0000 mg | Freq: Once | INTRAMUSCULAR | Status: AC
  Administered 2017-05-24: 4 mg via INTRAVENOUS
  Filled 2017-05-24: qty 2

## 2017-05-24 MED ORDER — ONDANSETRON 4 MG PO TBDP: 4.0000 mg | ORAL_TABLET | Freq: Three times a day (TID) | ORAL | 0 refills | Status: DC | PRN

## 2017-05-24 MED ORDER — HYDROMORPHONE HCL 1 MG/ML IJ SOLN
0.5000 mg | Freq: Once | INTRAMUSCULAR | Status: AC
  Administered 2017-05-24: 0.5 mg via INTRAVENOUS
  Filled 2017-05-24: qty 1

## 2017-05-24 MED ORDER — OXYCODONE HCL 5 MG PO TABS: 5.0000 mg | ORAL_TABLET | ORAL | 0 refills | Status: DC | PRN

## 2017-05-24 MED FILL — DOK 100 MG SOFTGEL: 100 | 50 days supply | Qty: 100 | Fill #0

## 2017-05-24 MED FILL — oxyCODONE HCL 5 MG TABS: 5 | 3 days supply | Qty: 15 | Fill #0

## 2017-05-24 MED FILL — ONDANSETRON ODT 4 MG TABLET: 4 | 5 days supply | Qty: 15 | Fill #0

## 2017-05-24 NOTE — ED Triage Notes (Signed)
LLQ abd pain radiating to his back since yesterday. Endorses N/V. Denies urinary symptoms.

## 2017-05-24 NOTE — Discharge Instructions (Signed)
It was my pleasure taking care of you today!   You have been diagnosed with kidney stones. Drink plenty of fluids to help you pass the stone.  Use your pain medication as directed and only as needed for severe pain. Use Zofran for nausea as directed.  Follow up with the urology clinic listed in regards to your hospital visit.   Return to the ED immediately if you develop fever that persists > 101, uncontrolled pain or vomiting, or other concerns.   Do not drink alcohol, drive or participate in any other potentially dangerous activities while taking opiate pain medication as it may make you sleepy. Do not take this medication with any other sedating medications, either prescription or over-the-counter.    This medication is an opiate (or narcotic) pain medication and can be habit forming.  Use it as little as possible to achieve adequate pain control.  Do not use or use it with extreme caution if you have a history of opiate abuse or dependence. This medication is intended for your use only - do not give any to anyone else and keep it in a secure place where nobody else, especially children, have access to it. It will also cause or worsen constipation, so you may want to consider taking an over-the-counter stool softener while you are taking this medication.

## 2017-05-24 NOTE — ED Notes (Signed)
Pt given water for fluid challenge and is aware of need for urine.

## 2017-05-24 NOTE — ED Provider Notes (Signed)
[facility] EMERGENCY DEPARTMENT Provider Note   CSN: [id number] Arrival date & time: 05/24/17  0900     History   Chief Complaint Chief Complaint  Patient presents with  . Abdominal Pain    HPI William Booth is a 67 y.o. male.  The history is provided by the patient and medical records. No language interpreter was used.  Abdominal Pain   Associated symptoms include nausea, vomiting and constipation. Pertinent negatives include fever, diarrhea and dysuria.   William Booth is a 67 y.o. male  with a PMH of DM2, GERD, HTN, prior gastric lap band procedure who presents to the Emergency Department complaining of left flank pain which began yesterday around 2pm. Pain is constant, but will wax-and-wane. Radiates to back and down to LLQ. Associated with nausea and multiple episodes of emesis. Tried OTC gas medication with no relief. No fever chills. No urinary symptoms. He typically has BM daily but did not have one yesterday or today. No history of similar.   Past Medical History:  Diagnosis Date  . Anxiety   . Diabetes mellitus    type 2  . GERD (gastroesophageal reflux disease)   . Hypertension   . Hypothyroidism   . Low HDL (under 40)   . Narcolepsy   . Peripheral neuropathy    not taking any meds at this time  . Sleep apnea    uses CPAP, last sleep study 1999, had titration 11/2011  . Type II diabetes mellitus with stage 2 chronic kidney disease ([place]) 03/19/2016  . Umbilical hernia     Patient Active Problem List   Diagnosis Date Noted  . Sepsis due to pneumonia ([place]) 03/19/2016  . Type II diabetes mellitus with stage 2 chronic kidney disease ([place]) 03/19/2016  . Essential hypertension 03/19/2016  . Aspiration pneumonia of left lower lobe ([place]) 03/19/2016  . Hypothyroidism 03/19/2016  . Obesity 04/13/2015  . History of laparoscopic adjustable gastric banding, 02/18/2006. 02/21/2012  . Umbilical hernia 30/16/0109  . Obstructive sleep apnea 12/08/2011     Past Surgical History:  Procedure Laterality Date  . broken finger     right hand  . CARDIAC CATHETERIZATION  06/2009  . CLOSED REDUCTION FINGER WITH PERCUTANEOUS PINNING Left 09/30/2013   Procedure: LEFT SMALL FINGER METACARPAL CLOSED REDUCTION WITH PERCUTANEOUS PINNING;  Surgeon: Linna Hoff, MD;  Location: [facility];  Service: Orthopedics;  Laterality: Left;  . COLONOSCOPY    . GASTRIC RESTRICTION SURGERY  02/18/06   lap band  . TOOTH EXTRACTION  02/28/12  . UMBILICAL HERNIA REPAIR  03/20/2012   Procedure: LAPAROSCOPIC UMBILICAL HERNIA;  Surgeon: Shann Medal, MD;  Location: [facility];  Service: General;  Laterality: N/A;       Home Medications    Prior to Admission medications   Medication Sig Start Date End Date Taking? Authorizing Provider  ALPRAZolam Duanne Moron) 0.5 MG tablet Take 0.5 mg by mouth 2 (two) times daily as needed for anxiety.  02/16/11   [provider]  amitriptyline (ELAVIL) 100 MG tablet Take 100 mg by mouth at bedtime as needed for sleep.  02/14/11   [provider]  amphetamine-dextroamphetamine (ADDERALL XR) 30 MG 24 hr capsule Take 1 capsule (30 mg total) by mouth daily. 04/12/17   Baird Lyons D, MD  atenolol (TENORMIN) 50 MG tablet Take 50 mg by mouth 2 (two) times daily.    [provider]  buPROPion (WELLBUTRIN XL) 150 MG 24 hr tablet Take 2 tablets (300 mg  total) by mouth daily. 03/20/16   Hosie Poisson, MD  Cholecalciferol (VITAMIN D-3) 5000 units TABS Take 1 tablet by mouth daily.    [provider]  docusate sodium (COLACE) 100 MG capsule Take 1 capsule (100 mg total) by mouth every 12 (twelve) hours. 05/24/17   Ellakate Gonsalves, Ozella Almond, PA-C  escitalopram (LEXAPRO) 10 MG tablet Take 10 mg by mouth daily. 03/01/16   [provider]  fenofibrate 160 MG tablet Take 160 mg by mouth daily.    [provider]  glimepiride (AMARYL) 4 MG tablet Take 2 mg by mouth 2 (two) times daily.     [provider]   metFORMIN (GLUCOPHAGE) 500 MG tablet Take 2 tablets (1,000 mg total) by mouth 2 (two) times daily. 03/21/16   Hosie Poisson, MD  omega-3 acid ethyl esters (LOVAZA) 1 G capsule Take 1 g by mouth daily.  06/29/13   [provider]  ondansetron (ZOFRAN ODT) 4 MG disintegrating tablet Take 1 tablet (4 mg total) by mouth every 8 (eight) hours as needed for nausea or vomiting. 05/24/17   Lennyn Bellanca, Ozella Almond, PA-C  oxyCODONE (OXY IR/ROXICODONE) 5 MG immediate release tablet Take 1 tablet (5 mg total) by mouth every 4 (four) hours as needed for severe pain. 05/24/17   Delaila Nand, Ozella Almond, PA-C  rosuvastatin (CRESTOR) 40 MG tablet Take 40 mg by mouth daily. 03/01/16   [provider]  saccharomyces boulardii (FLORASTOR) 250 MG capsule Take 1 capsule (250 mg total) by mouth 2 (two) times daily. 03/20/16   Hosie Poisson, MD  TRULICITY 1.61 WR/6.0AV SOPN Inject weekly 03/28/16   [provider]  vitamin B-12 (CYANOCOBALAMIN) 1000 MCG tablet Take 1,000 mcg by mouth daily.    [provider]    Family History Family History  Problem Relation Age of Onset  . Heart failure Mother   . CAD Father   . Diabetes Brother     Social History Social History   Tobacco Use  . Smoking status: Never Smoker  . Smokeless tobacco: Never Used  Substance Use Topics  . Alcohol use: No  . Drug use: No     Allergies   Acetaminophen and Nsaids   Review of Systems Review of Systems  Constitutional: Negative for chills and fever.  Gastrointestinal: Positive for abdominal pain, constipation, nausea and vomiting. Negative for diarrhea.  Genitourinary: Negative for difficulty urinating and dysuria.  All other systems reviewed and are negative.    Physical Exam Updated Vital Signs BP 137/78 (BP Location: Right Arm)   Pulse 61   Temp 97.9 F (36.6 C) (Oral)   Resp 20   Ht 5\' 10"  (1.778 m)   Wt 106.6 kg (235 lb)   SpO2 99%   BMI 33.72 kg/m   Physical Exam  Constitutional:  He is oriented to person, place, and time. He appears well-developed and well-nourished. No distress.  HENT:  Head: Normocephalic and atraumatic.  Cardiovascular: Normal rate, regular rhythm and normal heart sounds.  No murmur heard. Pulmonary/Chest: Effort normal and breath sounds normal. No respiratory distress.  Abdominal: Soft. He exhibits no distension.  Tenderness to palpation of left flank and left lower quadrant. No CVA tenderness.  Musculoskeletal: He exhibits no edema.  Neurological: He is alert and oriented to person, place, and time.  Skin: Skin is warm and dry.  Nursing note and vitals reviewed.    ED Treatments / Results  Labs (all labs ordered are listed, but only abnormal results are displayed) Labs Reviewed  COMPREHENSIVE METABOLIC PANEL - Abnormal; Notable for the following components:      Result Value   Glucose, Bld 128 (*)    BUN 27 (*)    Creatinine, Ser 1.90 (*)    GFR calc non Af Amer 36 (*)    GFR calc Af Amer 41 (*)    All other components within normal limits  CBC WITH DIFFERENTIAL/PLATELET - Abnormal; Notable for the following components:   WBC 13.8 (*)    Neutro Abs 11.4 (*)    Monocytes Absolute 1.1 (*)    All other components within normal limits  URINALYSIS, ROUTINE W REFLEX MICROSCOPIC - Abnormal; Notable for the following components:   Color, Urine AMBER (*)    APPearance CLOUDY (*)    Hgb urine dipstick LARGE (*)    All other components within normal limits  URINALYSIS, MICROSCOPIC (REFLEX) - Abnormal; Notable for the following components:   Bacteria, UA MANY (*)    Squamous Epithelial / LPF 0-5 (*)    All other components within normal limits  LIPASE, BLOOD    EKG  EKG Interpretation None       Radiology Ct Renal Stone Study  Result Date: 05/24/2017 CLINICAL DATA:  Left lower quadrant pain, back pain EXAM: CT ABDOMEN AND PELVIS WITHOUT CONTRAST TECHNIQUE: Multidetector CT imaging of the abdomen and pelvis was performed  following the standard protocol without IV contrast. COMPARISON:  None. FINDINGS: Lower chest: Small calcified granuloma in the left lower lobe. No acute abnormality. Calcifications scattered in the visualized coronary arteries. Heart is normal size. No effusions. Hepatobiliary: No focal hepatic abnormality. Gallbladder unremarkable. Pancreas: No focal abnormality or ductal dilatation. Spleen: No focal abnormality.  Normal size. Adrenals/Urinary Tract: 4 mm proximal left ureteral stone with mild left hydronephrosis and perinephric stranding. Punctate nonobstructing stones in the lower poles of the kidneys bilaterally. No adrenal mass. Urinary bladder unremarkable. Stomach/Bowel: Gastric lap band device noted in place near the GE junction. Moderate stool throughout the colon. Normal appendix. No evidence of bowel obstruction. Vascular/Lymphatic: No evidence of aneurysm or adenopathy. Reproductive: No visible focal abnormality. Other: No free fluid or free air. Musculoskeletal: No acute bony abnormality. IMPRESSION: 4 mm proximal left ureteral stone with mild left hydronephrosis and perinephric stranding. Punctate bilateral lower pole nephrolithiasis. Prior gastric lap band. Moderate stool in the colon. No acute findings in the abdomen or pelvis. Electronically Signed   By: Rolm Baptise M.D.   On: 05/24/2017 10:07    Procedures Procedures (including critical care time)  Medications Ordered in ED Medications  sodium chloride 0.9 % bolus 500 mL (0 mLs Intravenous Stopped 05/24/17 1014)  morphine 4 MG/ML injection 4 mg (4 mg Intravenous Given 05/24/17 0941)  ondansetron (ZOFRAN) injection 4 mg (4 mg Intravenous Given 05/24/17 0941)  sodium chloride 0.9 % bolus 500 mL (0 mLs Intravenous Stopped 05/24/17 1116)  HYDROmorphone (DILAUDID) injection 0.5 mg (0.5 mg Intravenous Given 05/24/17 1045)     Initial Impression / Assessment and Plan / ED Course  I have reviewed the triage vital signs and the nursing  notes.  Pertinent labs & imaging results that were available during my care of the patient were reviewed by me and considered in my medical decision making (see chart for details).    William Booth is a 67 y.o. male who presents to ED for left flank pain which began yesterday. On exam, patient afebrile and hemodynamically stable with tenderness to the left flank and left lower quadrant.  Labs reviewed:  Bump in creatinine to 1.90, leukocytosis of 13.8.  UA with large amount of hemoglobin, but negative nitrites, leukocytes and 0-5 WBCs.  CT renal study shows 4 mm proximal left ureteral stone with mild left hydro-and perinephric stranding.  No other acute findings.  Pain and nausea controlled in emergency department.  He is tolerating p.o. without difficulty.  Symptomatic home care instructions discussed, urology follow-up encouraged.  Patient does reports constipation with no BM in 2 days, typically has bowel movements daily.  Given he will be starting on narcotic pain medication, will start on stool softener as well. Discussed importance of increasing hydration. Reasons to return to ER discussed and all questions answered.    Final Clinical Impressions(s) / ED Diagnoses   Final diagnoses:  Kidney stone    ED Discharge Orders        Ordered    oxyCODONE (OXY IR/ROXICODONE) 5 MG immediate release tablet  Every 4 hours PRN     05/24/17 1343    ondansetron (ZOFRAN ODT) 4 MG disintegrating tablet  Every 8 hours PRN     05/24/17 1343    docusate sodium (COLACE) 100 MG capsule  Every 12 hours     05/24/17 1345       Alira Fretwell, Ozella Almond, PA-C 05/24/17 1349    Gareth Morgan, MD 05/24/17 1849

## 2017-05-27 ENCOUNTER — Encounter (HOSPITAL_BASED_OUTPATIENT_CLINIC_OR_DEPARTMENT_OTHER): Payer: Self-pay

## 2017-05-27 ENCOUNTER — Ambulatory Visit (HOSPITAL_BASED_OUTPATIENT_CLINIC_OR_DEPARTMENT_OTHER)
Admission: RE | Admit: 2017-05-27 | Discharge: 2017-05-27 | Disposition: A | Discharge status: Home / Self Care | Payer: 59 | Source: Ambulatory Visit | Attending: Urology | Admitting: Urology

## 2017-05-27 ENCOUNTER — Encounter (HOSPITAL_BASED_OUTPATIENT_CLINIC_OR_DEPARTMENT_OTHER)
Admission: RE | Disposition: A | Discharge status: Home / Self Care | Payer: Self-pay | Source: Ambulatory Visit | Attending: Urology

## 2017-05-27 ENCOUNTER — Other Ambulatory Visit: Payer: Self-pay | Admitting: Urology

## 2017-05-27 ENCOUNTER — Ambulatory Visit (HOSPITAL_BASED_OUTPATIENT_CLINIC_OR_DEPARTMENT_OTHER): Payer: 59 | Admitting: Anesthesiology

## 2017-05-27 DIAGNOSIS — E1122 Type 2 diabetes mellitus with diabetic chronic kidney disease: Secondary | ICD-10-CM | POA: Insufficient documentation

## 2017-05-27 DIAGNOSIS — F419 Anxiety disorder, unspecified: Secondary | ICD-10-CM | POA: Insufficient documentation

## 2017-05-27 DIAGNOSIS — E1165 Type 2 diabetes mellitus with hyperglycemia: Secondary | ICD-10-CM | POA: Insufficient documentation

## 2017-05-27 DIAGNOSIS — E039 Hypothyroidism, unspecified: Secondary | ICD-10-CM | POA: Diagnosis not present

## 2017-05-27 DIAGNOSIS — N179 Acute kidney failure, unspecified: Secondary | ICD-10-CM | POA: Diagnosis not present

## 2017-05-27 DIAGNOSIS — G4730 Sleep apnea, unspecified: Secondary | ICD-10-CM | POA: Diagnosis not present

## 2017-05-27 DIAGNOSIS — E119 Type 2 diabetes mellitus without complications: Secondary | ICD-10-CM | POA: Diagnosis not present

## 2017-05-27 DIAGNOSIS — K219 Gastro-esophageal reflux disease without esophagitis: Secondary | ICD-10-CM | POA: Diagnosis not present

## 2017-05-27 DIAGNOSIS — N201 Calculus of ureter: Secondary | ICD-10-CM | POA: Diagnosis not present

## 2017-05-27 DIAGNOSIS — Z79899 Other long term (current) drug therapy: Secondary | ICD-10-CM | POA: Insufficient documentation

## 2017-05-27 DIAGNOSIS — G47419 Narcolepsy without cataplexy: Secondary | ICD-10-CM | POA: Diagnosis not present

## 2017-05-27 DIAGNOSIS — Z7984 Long term (current) use of oral hypoglycemic drugs: Secondary | ICD-10-CM | POA: Insufficient documentation

## 2017-05-27 DIAGNOSIS — N182 Chronic kidney disease, stage 2 (mild): Secondary | ICD-10-CM | POA: Insufficient documentation

## 2017-05-27 DIAGNOSIS — Z886 Allergy status to analgesic agent status: Secondary | ICD-10-CM | POA: Diagnosis not present

## 2017-05-27 DIAGNOSIS — I129 Hypertensive chronic kidney disease with stage 1 through stage 4 chronic kidney disease, or unspecified chronic kidney disease: Secondary | ICD-10-CM | POA: Diagnosis not present

## 2017-05-27 DIAGNOSIS — R1084 Generalized abdominal pain: Secondary | ICD-10-CM | POA: Diagnosis not present

## 2017-05-27 HISTORY — PX: CYSTOSCOPY W/ URETERAL STENT PLACEMENT: SHX1429

## 2017-05-27 LAB — GLUCOSE, CAPILLARY: GLUCOSE-CAPILLARY: 105 mg/dL — AB (ref 65–99)

## 2017-05-27 SURGERY — CYSTOSCOPY, WITH RETROGRADE PYELOGRAM AND URETERAL STENT INSERTION
Anesthesia: General | Site: Bladder | Laterality: Left

## 2017-05-27 MED ORDER — FENTANYL CITRATE (PF) 100 MCG/2ML IJ SOLN
INTRAMUSCULAR | Status: DC | PRN
  Administered 2017-05-27: 50 ug via INTRAVENOUS

## 2017-05-27 MED ORDER — PROMETHAZINE HCL 25 MG/ML IJ SOLN
6.2500 mg | INTRAMUSCULAR | Status: DC | PRN
  Filled 2017-05-27: qty 1

## 2017-05-27 MED ORDER — IOHEXOL 300 MG/ML  SOLN
INTRAMUSCULAR | Status: DC | PRN
  Administered 2017-05-27: 2 mL via URETHRAL

## 2017-05-27 MED ORDER — FENTANYL CITRATE (PF) 100 MCG/2ML IJ SOLN
INTRAMUSCULAR | Status: AC
  Filled 2017-05-27: qty 2

## 2017-05-27 MED ORDER — MIDAZOLAM HCL 2 MG/2ML IJ SOLN
INTRAMUSCULAR | Status: AC
  Filled 2017-05-27: qty 2

## 2017-05-27 MED ORDER — ONDANSETRON HCL 4 MG/2ML IJ SOLN
INTRAMUSCULAR | Status: DC | PRN
  Administered 2017-05-27: 4 mg via INTRAVENOUS

## 2017-05-27 MED ORDER — CIPROFLOXACIN IN D5W 400 MG/200ML IV SOLN
400.0000 mg | Freq: Once | INTRAVENOUS | Status: AC
  Administered 2017-05-27: 400 mg via INTRAVENOUS
  Filled 2017-05-27: qty 200

## 2017-05-27 MED ORDER — BELLADONNA ALKALOIDS-OPIUM 16.2-60 MG RE SUPP
RECTAL | Status: AC
  Filled 2017-05-27: qty 1

## 2017-05-27 MED ORDER — CIPROFLOXACIN IN D5W 400 MG/200ML IV SOLN
INTRAVENOUS | Status: AC
  Filled 2017-05-27: qty 200

## 2017-05-27 MED ORDER — ATENOLOL 50 MG PO TABS
50.0000 mg | ORAL_TABLET | Freq: Once | ORAL | Status: AC
  Administered 2017-05-27: 50 mg via ORAL
  Filled 2017-05-27 (×2): qty 1

## 2017-05-27 MED ORDER — OXYCODONE HCL 5 MG PO TABS
ORAL_TABLET | ORAL | Status: AC
Start: 2017-05-27 — End: ?
  Filled 2017-05-27: qty 1

## 2017-05-27 MED ORDER — ONDANSETRON HCL 4 MG/2ML IJ SOLN
INTRAMUSCULAR | Status: AC
  Filled 2017-05-27: qty 2

## 2017-05-27 MED ORDER — HYDROMORPHONE HCL 1 MG/ML IJ SOLN
0.2500 mg | INTRAMUSCULAR | Status: DC | PRN
  Administered 2017-05-27: 0.25 mg via INTRAVENOUS
  Filled 2017-05-27: qty 0.5

## 2017-05-27 MED ORDER — LACTATED RINGERS IV SOLN
INTRAVENOUS | Status: DC
  Administered 2017-05-27: 13:00:00 via INTRAVENOUS
  Filled 2017-05-27: qty 1000

## 2017-05-27 MED ORDER — LIDOCAINE 2% (20 MG/ML) 5 ML SYRINGE
INTRAMUSCULAR | Status: DC | PRN
  Administered 2017-05-27: 100 mg via INTRAVENOUS

## 2017-05-27 MED ORDER — OXYCODONE HCL 5 MG PO TABS
5.0000 mg | ORAL_TABLET | Freq: Once | ORAL | Status: AC
  Administered 2017-05-27: 5 mg via ORAL
  Filled 2017-05-27: qty 1

## 2017-05-27 MED ORDER — HYDROMORPHONE HCL 1 MG/ML IJ SOLN
INTRAMUSCULAR | Status: AC
  Filled 2017-05-27: qty 1

## 2017-05-27 MED ORDER — PROPOFOL 10 MG/ML IV BOLUS
INTRAVENOUS | Status: DC | PRN
  Administered 2017-05-27: 200 mg via INTRAVENOUS

## 2017-05-27 MED ORDER — SODIUM CHLORIDE 0.9 % IR SOLN
Status: DC | PRN
  Administered 2017-05-27: 3000 mL via INTRAVESICAL

## 2017-05-27 MED ORDER — DEXAMETHASONE SODIUM PHOSPHATE 10 MG/ML IJ SOLN
INTRAMUSCULAR | Status: DC | PRN
  Administered 2017-05-27: 5 mg via INTRAVENOUS

## 2017-05-27 MED ORDER — PROPOFOL 10 MG/ML IV BOLUS
INTRAVENOUS | Status: AC
  Filled 2017-05-27: qty 20

## 2017-05-27 MED ORDER — MIDAZOLAM HCL 2 MG/2ML IJ SOLN
INTRAMUSCULAR | Status: DC | PRN
  Administered 2017-05-27: 1 mg via INTRAVENOUS

## 2017-05-27 MED ORDER — BELLADONNA ALKALOIDS-OPIUM 16.2-60 MG RE SUPP
RECTAL | Status: DC | PRN
  Administered 2017-05-27: 1 via RECTAL

## 2017-05-27 MED FILL — ONDANSETRON HCL 4 MG TABLET: 4 | 5 days supply | Qty: 30 | Fill #0

## 2017-05-27 MED FILL — oxyCODONE HCL 10 MG TABS: 10 | 3 days supply | Qty: 20 | Fill #0

## 2017-05-27 MED FILL — CIPROFLOXACIN HCL 500 MG TA: 500 | 7 days supply | Qty: 14 | Fill #0

## 2017-05-27 SURGICAL SUPPLY — 30 items
BAG DRAIN URO-CYSTO SKYTR STRL (DRAIN) ×3 IMPLANT
BAG URINE DRAINAGE (UROLOGICAL SUPPLIES) ×3 IMPLANT
BASKET STONE 1.7 NGAGE (UROLOGICAL SUPPLIES) IMPLANT
BASKET ZERO TIP NITINOL 2.4FR (BASKET) ×3 IMPLANT
BENZOIN TINCTURE PRP APPL 2/3 (GAUZE/BANDAGES/DRESSINGS) IMPLANT
CATH FOLEY 2WAY SLVR  5CC 18FR (CATHETERS) ×2
CATH FOLEY 2WAY SLVR 5CC 18FR (CATHETERS) ×1 IMPLANT
CATH INTERMIT  6FR 70CM (CATHETERS) ×3 IMPLANT
CLOSURE WOUND 1/2 X4 (GAUZE/BANDAGES/DRESSINGS)
CLOTH BEACON ORANGE TIMEOUT ST (SAFETY) ×3 IMPLANT
FIBER LASER FLEXIVA 365 (UROLOGICAL SUPPLIES) IMPLANT
FIBER LASER TRAC TIP (UROLOGICAL SUPPLIES) IMPLANT
GLOVE BIO SURGEON STRL SZ7.5 (GLOVE) ×3 IMPLANT
GOWN STRL REUS W/TWL XL LVL3 (GOWN DISPOSABLE) IMPLANT
GUIDEWIRE ANG ZIPWIRE 038X150 (WIRE) ×3 IMPLANT
GUIDEWIRE STR DUAL SENSOR (WIRE) IMPLANT
INFUSOR MANOMETER BAG 3000ML (MISCELLANEOUS) ×3 IMPLANT
IV NS 1000ML (IV SOLUTION)
IV NS 1000ML BAXH (IV SOLUTION) IMPLANT
IV NS IRRIG 3000ML ARTHROMATIC (IV SOLUTION) ×3 IMPLANT
KIT RM TURNOVER CYSTO AR (KITS) ×3 IMPLANT
MANIFOLD NEPTUNE II (INSTRUMENTS) ×3 IMPLANT
NS IRRIG 500ML POUR BTL (IV SOLUTION) ×6 IMPLANT
PACK CYSTO (CUSTOM PROCEDURE TRAY) ×3 IMPLANT
STENT URET 6FRX26 CONTOUR (STENTS) ×3 IMPLANT
STRIP CLOSURE SKIN 1/2X4 (GAUZE/BANDAGES/DRESSINGS) IMPLANT
SYRINGE 10CC LL (SYRINGE) ×3 IMPLANT
SYRINGE IRR TOOMEY STRL 70CC (SYRINGE) ×3 IMPLANT
TUBE CONNECTING 12'X1/4 (SUCTIONS)
TUBE CONNECTING 12X1/4 (SUCTIONS) IMPLANT

## 2017-05-27 NOTE — Anesthesia Postprocedure Evaluation (Signed)
Anesthesia Post Note  Patient: William Booth  Procedure(s) Performed: CYSTOSCOPY WITH RETROGRADE PYELOGRAM/URETERAL STENT PLACEMENT (Left Bladder)     Patient location during evaluation: PACU Anesthesia Type: General Level of consciousness: awake and alert Pain management: pain level controlled Vital Signs Assessment: post-procedure vital signs reviewed and stable Respiratory status: spontaneous breathing, nonlabored ventilation, respiratory function stable and patient connected to nasal cannula oxygen Cardiovascular status: blood pressure returned to baseline and stable Postop Assessment: no apparent nausea or vomiting Anesthetic complications: no    Last Vitals:  Vitals:   05/27/17 1416 05/27/17 1423  BP: 130/65 126/88  Pulse: 72 71  Resp: (!) 8 11  Temp:    SpO2: 100% 99%                  Effie Berkshire

## 2017-05-27 NOTE — H&P (Signed)
Urology Preoperative H&P   Chief Complaint: Left flank pain  History of Present Illness: William Booth is a 67 y.o. male with an obstructing 4 mm left midureteral stone seen on CT stone study from 05/24/2017.  His stone is associated with left-sided flank pain, nausea/vomiting and subjective fevers.  His temperature in the office today is 100F.  His pain is moderately controlled with oxycodone and his nausea is alleviated with Zofran.  He has no prior history of nephrolithiasis and denies dysuria or gross hematuria.    Past Medical History:  Diagnosis Date  . Anxiety   . Diabetes mellitus    type 2  . GERD (gastroesophageal reflux disease)   . Hypertension   . Hypothyroidism   . Low HDL (under 40)   . Narcolepsy   . Peripheral neuropathy    not taking any meds at this time  . Sleep apnea    uses CPAP, last sleep study 1999, had titration 11/2011  . Type II diabetes mellitus with stage 2 chronic kidney disease ([place]) 03/19/2016  . Umbilical hernia     Past Surgical History:  Procedure Laterality Date  . broken finger     right hand  . CARDIAC CATHETERIZATION  06/2009  . CLOSED REDUCTION FINGER WITH PERCUTANEOUS PINNING Left 09/30/2013   Procedure: LEFT SMALL FINGER METACARPAL CLOSED REDUCTION WITH PERCUTANEOUS PINNING;  Surgeon: William Hoff, MD;  Location: [facility];  Service: Orthopedics;  Laterality: Left;  . COLONOSCOPY    . GASTRIC RESTRICTION SURGERY  02/18/06   lap band  . TOOTH EXTRACTION  02/28/12  . UMBILICAL HERNIA REPAIR  03/20/2012   Procedure: LAPAROSCOPIC UMBILICAL HERNIA;  Surgeon: William Medal, MD;  Location: [facility];  Service: General;  Laterality: N/A;    Allergies:  Allergies  Allergen Reactions  . Acetaminophen Anaphylaxis  . Nsaids Hives and Rash    Face only will spread if not caught early.  Can take ASA    Family History  Problem Relation Age of Onset  . Heart failure Mother   . CAD Father   . Diabetes Brother     Social History:  reports  that  has never smoked. he has never used smokeless tobacco. He reports that he does not drink alcohol or use drugs.  ROS: A complete review of systems was performed.  All systems are negative except for pertinent findings as noted.  Physical Exam:  Vital signs in last 24 hours:   Constitutional:  Alert and oriented, No acute distress Cardiovascular: Regular rate and rhythm, No JVD Respiratory: Normal respiratory effort, Lungs clear bilaterally GI: Abdomen is soft, nontender, nondistended, no abdominal masses GU: No CVA tenderness Lymphatic: No lymphadenopathy Neurologic: Grossly intact, no focal deficits Psychiatric: Normal mood and affect  Laboratory Data:  No results for input(s): WBC, HGB, HCT, PLT in the last 72 hours.  No results for input(s): NA, K, CL, GLUCOSE, BUN, CALCIUM, CREATININE in the last 72 hours.  Invalid input(s): CO3   No results found for this or any previous visit (from the past 24 hour(s)). No results found for this or any previous visit (from the past 240 hour(s)).  Renal Function: Recent Labs    05/24/17 0923  CREATININE 1.90*   Estimated Creatinine Clearance: 48 mL/min (A) (by C-G formula based on SCr of 1.9 mg/dL (H)).  Radiologic Imaging: No results found.  I independently reviewed the above imaging studies.  Assessment and Plan William Booth is a 67 y.o. male with 4  mm left mid-ureteral stone, with acute kidney injury, left renal colic and a possible urinary tract infection  -The risks, benefits and alternatives of cystoscopy with left retrograde pyelogram and left JJ stent placement was discussed with the patient.  I explained this is going to be a staged procedure to treat his kidney stone out of concern for possible urinary tract infection.  Risks include bleeding, urinary tract infection, inability to place retrograde ureteral stent, stent-related discomfort, ureteral injury and the inherent risks of general anesthesia.  He voices  understanding and wishes to proceed.   William Hughs, MD 05/27/2017, 11:39 AM  [facility] Pager: [phone]

## 2017-05-27 NOTE — Anesthesia Procedure Notes (Signed)
Procedure Name: LMA Insertion Date/Time: 05/27/2017 1:15 PM Performed by: Wanita Chamberlain, CRNA Pre-anesthesia Checklist: Patient identified, Suction available, Patient being monitored, Emergency Drugs available and Timeout performed Patient Re-evaluated:Patient Re-evaluated prior to induction Oxygen Delivery Method: Circle system utilized Preoxygenation: Pre-oxygenation with 100% oxygen Induction Type: IV induction Ventilation: Mask ventilation without difficulty LMA: LMA with gastric port inserted LMA Size: 4.0 Number of attempts: 1 Placement Confirmation: breath sounds checked- equal and bilateral,  CO2 detector and positive ETCO2 Tube secured with: Tape Dental Injury: Teeth and Oropharynx as per pre-operative assessment  Difficulty Due To: Difficult Airway- due to anterior larynx

## 2017-05-27 NOTE — Op Note (Signed)
Operative Note  Preoperative diagnosis:  1.  4 mm left mid ureteral stone  Postoperative diagnosis: 1.  4 mm left mid ureteral stone with obstruction 2.  Possible UTI secondary to obstructive uropathy  Procedure(s): 1. Cystoscopy 2. Left retrograde pyelogram 3. Left JJ stent placement  Surgeon: Ellison Hughs, MD  Assistants:  None  Anesthesia:  Gen. LMA  Complications:  None  EBL:  10 mL  Specimens: 1. None  Drains/Catheters: 1.  Left 6 French by 26 cm JJ stent without tether 2. 18 French Foley catheter with 10 mL in the balloon  Intraoperative findings:  There was coagulated/deoxygenated blood and urinary debris following manipulation of the stone and JJ stent placement  Indication:  LEEON MAKAR is a 67 y.o. male with persistent left-sided flank pain associated with general malaise secondary to an obstructing 4 mm left midureteral stone seen on CT from 05/24/2017 and concern for a possible urinary tract infection. He has been consented for the above procedures, voices understanding and wishes to proceed.  Description of procedure:  After informed consent was obtained, the patient was brought to the operating room and general LMA anesthesia was administered. The patient was then placed in the dorsolithotomy position and prepped and draped in usual sterile fashion. A timeout was performed. A 21 French rigid cystoscope was then inserted into the urethral meatus and advanced into the bladder under direct vision. A complete bladder survey revealed no intravesical pathology.  A 6 French open ended catheter was then inserted into the left ureteral orifice and a left retrograde pyelogram was obtained that showed a left mid to distal ureteral filling defect consistent with the stone seen on CT from 05/24/2017. The remainder of the collecting system appeared within normal limits with crisp outlining of all renal calyces as well as the renal pelvis. A Glidewire was then advanced  beyond the level of the stone. Following wire manipulation of the stone, there was coagulated and deoxygenated blood as well as urinary debris expelled from the left ureteral orifice. A 6 [place] JJ stent was then placed over the wire and into good position within the left collecting system, confirming placement via fluoroscopy. A 18 French Foley catheter was then placed to allow for maximal urinary drainage over the next 48 hours. The patient tolerated the procedure well and was transferred to the postanesthesia unit in stable condition.  Plan:  Keep Foley catheter in place for 48 hours. We'll plan on definitive stone treatment in 1 week. The patient has been sent home with a one-week course of Cipro as well as pain and nausea medication.

## 2017-05-27 NOTE — Transfer of Care (Signed)
Immediate Anesthesia Transfer of Care Note  Patient: William Booth  Procedure(s) Performed: CYSTOSCOPY WITH RETROGRADE PYELOGRAM/URETERAL STENT PLACEMENT (Left )  Patient Location: PACU  Anesthesia Type:General  Level of Consciousness: awake, alert , oriented and patient cooperative  Airway & Oxygen Therapy: Patient Spontanous Breathing and Patient connected to nasal cannula oxygen  Post-op Assessment: Report given to RN and Post -op Vital signs reviewed and stable  Post vital signs: Reviewed and stable  Last Vitals:  Vitals:   05/27/17 1207  BP: (!) 141/87  Pulse: 76  Resp: 18  Temp: 37.4 C  SpO2: 100%    Last Pain:  Vitals:   05/27/17 1242  TempSrc:   PainSc: 6       Patients Stated Pain Goal: 4 (01/41/03 0131)  Complications: No apparent anesthesia complications

## 2017-05-27 NOTE — Anesthesia Preprocedure Evaluation (Addendum)
Anesthesia Evaluation  Patient identified by MRN, date of birth, ID band Patient awake    Reviewed: Allergy & Precautions, NPO status , Patient's Chart, lab work & pertinent test results, reviewed documented beta blocker date and time   History of Anesthesia Complications Negative for: history of anesthetic complications  Airway Mallampati: III  TM Distance: >3 FB Neck ROM: Full    Dental no notable dental hx. (+) Dental Advisory Given, Teeth Intact   Pulmonary sleep apnea and Continuous Positive Airway Pressure Ventilation ,    Pulmonary exam normal        Cardiovascular hypertension, Pt. on medications and Pt. on home beta blockers negative cardio ROS Normal cardiovascular exam     Neuro/Psych PSYCHIATRIC DISORDERS Anxiety negative neurological ROS     GI/Hepatic Neg liver ROS, GERD  ,  Endo/Other  negative endocrine ROSdiabetesMorbid obesity  Renal/GU   negative genitourinary   Musculoskeletal negative musculoskeletal ROS (+)   Abdominal   Peds negative pediatric ROS (+)  Hematology negative hematology ROS (+)   Anesthesia Other Findings   Reproductive/Obstetrics negative OB ROS                            Anesthesia Physical Anesthesia Plan  ASA: III  Anesthesia Plan: General   Post-op Pain Management:    Induction: Intravenous  PONV Risk Score and Plan: 2 and Ondansetron and Dexamethasone  Airway Management Planned: LMA  Additional Equipment:   Intra-op Plan:   Post-operative Plan: Extubation in OR  Informed Consent: I have reviewed the patients History and Physical, chart, labs and discussed the procedure including the risks, benefits and alternatives for the proposed anesthesia with the patient or authorized representative who has indicated his/her understanding and acceptance.   Dental advisory given  Plan Discussed with: CRNA and Anesthesiologist  Anesthesia  Plan Comments:         Anesthesia Quick Evaluation

## 2017-05-27 NOTE — Discharge Instructions (Signed)

## 2017-05-27 NOTE — Interval H&P Note (Signed)
History and Physical Interval Note:  05/27/2017 12:28 PM  William Booth  has presented today for surgery, with the diagnosis of LEFT URETERAL STONE  The various methods of treatment have been discussed with the patient and family. After consideration of risks, benefits and other options for treatment, the patient has consented to  Procedure(s) with comments: CYSTOSCOPY WITH RETROGRADE PYELOGRAM/URETERAL STENT PLACEMENT (Left) - ONLY NEEDS 15 MIN as a surgical intervention .  The patient's history has been reviewed, patient examined, no change in status, stable for surgery.  I have reviewed the patient's chart and labs.  Questions were answered to the patient's satisfaction.     Conception Oms Winter

## 2017-05-28 ENCOUNTER — Encounter (HOSPITAL_BASED_OUTPATIENT_CLINIC_OR_DEPARTMENT_OTHER): Payer: Self-pay | Admitting: Urology

## 2017-05-31 ENCOUNTER — Encounter (HOSPITAL_BASED_OUTPATIENT_CLINIC_OR_DEPARTMENT_OTHER): Payer: Self-pay

## 2017-05-31 ENCOUNTER — Other Ambulatory Visit: Payer: Self-pay

## 2017-05-31 NOTE — Progress Notes (Signed)
Spoke with:  Juliann Pulse (wife) NPO:  After Midnight, no gum, candy, or mints   Arrival time:  9:15AM Labs:  Istat 4, EKG AM medications:  Atenolol, Buproprion, Colace, Escitalopram, Fenofibrate, Rosuvastatin, Florastor, Xanax if needed, No Diabetic medications the morning or surgery, No bedtime Amaryl the night before surgery, My use Trulicity per normal routine the day before surgery Pre op orders:  Yes Ride home:  Juliann Pulse (wife) [phone]  Will bring CPAP machine, mask, and tubing DOS

## 2017-06-06 NOTE — H&P (Signed)
Urology Preoperative H&P   Chief Complaint: left flank pain  History of Present Illness: William Booth is a 67 y.o. male who presented to the office with a 72 hour history of intermittent, sharp left flank pain associated with nausea and vomiting.  He initially presented to the emergency department on 05/24/2017 with acute onset of left-sided flank pain. He had a CT stone study that showed an obstructing 4 mm left midureteral stone with perinephric stranding. He underwent cystoscopy with left JJ stent placement on 05/27/17 and is here today for definitive stone treatment.      Past Medical History:  Diagnosis Date  . Anxiety   . Diabetes mellitus    type 2  . GERD (gastroesophageal reflux disease)   . Hypertension   . Hypothyroidism    pt states no thyroid problems  . Low HDL (under 40)   . Narcolepsy   . Peripheral neuropathy    not taking any meds at this time  . Sleep apnea    uses CPAP, last sleep study 1999, had titration 11/2011  . Type II diabetes mellitus with stage 2 chronic kidney disease ([place]) 03/19/2016  . Umbilical hernia     Past Surgical History:  Procedure Laterality Date  . broken finger     right hand  . CARDIAC CATHETERIZATION  06/2009  . CLOSED REDUCTION FINGER WITH PERCUTANEOUS PINNING Left 09/30/2013   Procedure: LEFT SMALL FINGER METACARPAL CLOSED REDUCTION WITH PERCUTANEOUS PINNING;  Surgeon: Linna Hoff, MD;  Location: [facility];  Service: Orthopedics;  Laterality: Left;  . COLONOSCOPY    . CYSTOSCOPY W/ URETERAL STENT PLACEMENT Left 05/27/2017   Procedure: CYSTOSCOPY WITH RETROGRADE PYELOGRAM/URETERAL STENT PLACEMENT;  Surgeon: Ceasar Mons, MD;  Location: [facility];  Service: Urology;  Laterality: Left;  ONLY NEEDS 15 MIN  . GASTRIC RESTRICTION SURGERY  02/18/06   lap band  . TOOTH EXTRACTION  02/28/12  . UMBILICAL HERNIA REPAIR  03/20/2012   Procedure: LAPAROSCOPIC UMBILICAL HERNIA;  Surgeon: Shann Medal, MD;  Location:  [place];  Service: General;  Laterality: N/A;    Allergies:  Allergies  Allergen Reactions  . Acetaminophen Anaphylaxis  . Nsaids Hives and Rash    Face only will spread if not caught early.  Can take ASA    Family History  Problem Relation Age of Onset  . Heart failure Mother   . CAD Father   . Diabetes Brother     Social History:  reports that  has never smoked. he has never used smokeless tobacco. He reports that he does not drink alcohol or use drugs.  ROS: A complete review of systems was performed.  All systems are negative except for pertinent findings as noted.  Physical Exam:  Vital signs in last 24 hours:   Constitutional:  Alert and oriented, No acute distress Cardiovascular: Regular rate and rhythm, No JVD Respiratory: Normal respiratory effort, Lungs clear bilaterally GI: Abdomen is soft, nontender, nondistended, no abdominal masses Lymphatic: No lymphadenopathy Neurologic: Grossly intact, no focal deficits Psychiatric: Normal mood and affect  Laboratory Data:  No results for input(s): WBC, HGB, HCT, PLT in the last 72 hours.  No results for input(s): NA, K, CL, GLUCOSE, BUN, CALCIUM, CREATININE in the last 72 hours.  Invalid input(s): CO3   No results found for this or any previous visit (from the past 24 hour(s)). No results found for this or any previous visit (from the past 240 hour(s)).  Renal Function: No results  for input(s): CREATININE in the last 168 hours. Estimated Creatinine Clearance: 48.8 mL/min (A) (by C-G formula based on SCr of 1.9 mg/dL (H)).  Radiologic Imaging: No results found.  I independently reviewed the above imaging studies.  Assessment and Plan William Booth is a 67 y.o. male with 4 mm left mid ureteral stone s/p left JJ stent on 05/27/17  -The risks, benefits and alternatives of cystoscopy with left ureteroscopy, laser lithotripsy and left JJ stent replacement was discussed with the patient.  He voices understanding  and wishes to proceed.     Ellison Hughs, MD 06/06/2017, 5:44 PM  [facility] Pager: [phone]

## 2017-06-07 ENCOUNTER — Ambulatory Visit (HOSPITAL_BASED_OUTPATIENT_CLINIC_OR_DEPARTMENT_OTHER): Payer: 59 | Admitting: Anesthesiology

## 2017-06-07 ENCOUNTER — Ambulatory Visit (HOSPITAL_BASED_OUTPATIENT_CLINIC_OR_DEPARTMENT_OTHER)
Admission: RE | Admit: 2017-06-07 | Discharge: 2017-06-07 | Disposition: A | Discharge status: Home / Self Care | Payer: 59 | Source: Ambulatory Visit | Attending: Urology | Admitting: Urology

## 2017-06-07 ENCOUNTER — Encounter (HOSPITAL_BASED_OUTPATIENT_CLINIC_OR_DEPARTMENT_OTHER)
Admission: RE | Disposition: A | Discharge status: Home / Self Care | Payer: Self-pay | Source: Ambulatory Visit | Attending: Urology

## 2017-06-07 ENCOUNTER — Other Ambulatory Visit: Payer: Self-pay

## 2017-06-07 ENCOUNTER — Encounter (HOSPITAL_BASED_OUTPATIENT_CLINIC_OR_DEPARTMENT_OTHER): Payer: Self-pay

## 2017-06-07 DIAGNOSIS — E039 Hypothyroidism, unspecified: Secondary | ICD-10-CM | POA: Insufficient documentation

## 2017-06-07 DIAGNOSIS — G629 Polyneuropathy, unspecified: Secondary | ICD-10-CM | POA: Insufficient documentation

## 2017-06-07 DIAGNOSIS — Z886 Allergy status to analgesic agent status: Secondary | ICD-10-CM | POA: Insufficient documentation

## 2017-06-07 DIAGNOSIS — Z7984 Long term (current) use of oral hypoglycemic drugs: Secondary | ICD-10-CM | POA: Diagnosis not present

## 2017-06-07 DIAGNOSIS — E669 Obesity, unspecified: Secondary | ICD-10-CM | POA: Insufficient documentation

## 2017-06-07 DIAGNOSIS — E1122 Type 2 diabetes mellitus with diabetic chronic kidney disease: Secondary | ICD-10-CM | POA: Insufficient documentation

## 2017-06-07 DIAGNOSIS — N182 Chronic kidney disease, stage 2 (mild): Secondary | ICD-10-CM | POA: Insufficient documentation

## 2017-06-07 DIAGNOSIS — G47419 Narcolepsy without cataplexy: Secondary | ICD-10-CM | POA: Diagnosis not present

## 2017-06-07 DIAGNOSIS — N201 Calculus of ureter: Secondary | ICD-10-CM | POA: Insufficient documentation

## 2017-06-07 DIAGNOSIS — F419 Anxiety disorder, unspecified: Secondary | ICD-10-CM | POA: Insufficient documentation

## 2017-06-07 DIAGNOSIS — K219 Gastro-esophageal reflux disease without esophagitis: Secondary | ICD-10-CM | POA: Insufficient documentation

## 2017-06-07 DIAGNOSIS — G4730 Sleep apnea, unspecified: Secondary | ICD-10-CM | POA: Insufficient documentation

## 2017-06-07 DIAGNOSIS — I129 Hypertensive chronic kidney disease with stage 1 through stage 4 chronic kidney disease, or unspecified chronic kidney disease: Secondary | ICD-10-CM | POA: Diagnosis not present

## 2017-06-07 HISTORY — PX: CYSTOSCOPY/URETEROSCOPY/HOLMIUM LASER/STENT PLACEMENT: SHX6546

## 2017-06-07 LAB — POCT I-STAT 4, (NA,K, GLUC, HGB,HCT)
GLUCOSE: 110 mg/dL — AB (ref 65–99)
HEMATOCRIT: 36 % — AB (ref 39.0–52.0)
HEMOGLOBIN: 12.2 g/dL — AB (ref 13.0–17.0)
Potassium: 3.7 mmol/L (ref 3.5–5.1)
Sodium: 144 mmol/L (ref 135–145)

## 2017-06-07 LAB — GLUCOSE, CAPILLARY: Glucose-Capillary: 121 mg/dL — ABNORMAL HIGH (ref 65–99)

## 2017-06-07 SURGERY — CYSTOSCOPY/URETEROSCOPY/HOLMIUM LASER/STENT PLACEMENT
Anesthesia: General | Laterality: Left

## 2017-06-07 MED ORDER — MIDAZOLAM HCL 2 MG/2ML IJ SOLN
INTRAMUSCULAR | Status: DC | PRN
  Administered 2017-06-07: 2 mg via INTRAVENOUS

## 2017-06-07 MED ORDER — MIDAZOLAM HCL 2 MG/2ML IJ SOLN
INTRAMUSCULAR | Status: AC
  Filled 2017-06-07: qty 2

## 2017-06-07 MED ORDER — PROPOFOL 10 MG/ML IV BOLUS
INTRAVENOUS | Status: DC | PRN
  Administered 2017-06-07: 200 mg via INTRAVENOUS

## 2017-06-07 MED ORDER — DEXAMETHASONE SODIUM PHOSPHATE 10 MG/ML IJ SOLN
INTRAMUSCULAR | Status: DC | PRN
  Administered 2017-06-07: 10 mg via INTRAVENOUS

## 2017-06-07 MED ORDER — FENTANYL CITRATE (PF) 100 MCG/2ML IJ SOLN
INTRAMUSCULAR | Status: AC
  Filled 2017-06-07: qty 2

## 2017-06-07 MED ORDER — SODIUM CHLORIDE 0.9 % IR SOLN
Status: DC | PRN
  Administered 2017-06-07: 3000 mL via INTRAVESICAL
  Administered 2017-06-07: 1000 mL via INTRAVESICAL

## 2017-06-07 MED ORDER — PHENAZOPYRIDINE HCL 200 MG PO TABS: 200.0000 mg | ORAL_TABLET | Freq: Three times a day (TID) | ORAL | 0 refills | Status: DC | PRN

## 2017-06-07 MED ORDER — FENTANYL CITRATE (PF) 100 MCG/2ML IJ SOLN
INTRAMUSCULAR | Status: DC | PRN
  Administered 2017-06-07 (×2): 25 ug via INTRAVENOUS
  Administered 2017-06-07: 50 ug via INTRAVENOUS

## 2017-06-07 MED ORDER — PROPOFOL 10 MG/ML IV BOLUS
INTRAVENOUS | Status: AC
  Filled 2017-06-07: qty 40

## 2017-06-07 MED ORDER — PHENAZOPYRIDINE HCL 200 MG PO TABS
200.0000 mg | ORAL_TABLET | Freq: Once | ORAL | Status: AC
  Administered 2017-06-07: 200 mg via ORAL
  Filled 2017-06-07: qty 1

## 2017-06-07 MED ORDER — OXYCODONE HCL 5 MG/5ML PO SOLN
5.0000 mg | Freq: Once | ORAL | Status: AC | PRN
  Filled 2017-06-07: qty 5

## 2017-06-07 MED ORDER — LIDOCAINE 2% (20 MG/ML) 5 ML SYRINGE
INTRAMUSCULAR | Status: DC | PRN
  Administered 2017-06-07: 80 mg via INTRAVENOUS

## 2017-06-07 MED ORDER — LIDOCAINE 2% (20 MG/ML) 5 ML SYRINGE
INTRAMUSCULAR | Status: AC
  Filled 2017-06-07: qty 5

## 2017-06-07 MED ORDER — ONDANSETRON HCL 4 MG/2ML IJ SOLN
INTRAMUSCULAR | Status: DC | PRN
  Administered 2017-06-07: 4 mg via INTRAVENOUS

## 2017-06-07 MED ORDER — OXYCODONE HCL 5 MG PO TABS: 5.0000 mg | ORAL_TABLET | ORAL | 0 refills | Status: DC | PRN

## 2017-06-07 MED ORDER — ONDANSETRON 4 MG PO TBDP: 4.0000 mg | ORAL_TABLET | Freq: Three times a day (TID) | ORAL | 0 refills | Status: DC | PRN

## 2017-06-07 MED ORDER — PHENAZOPYRIDINE HCL 100 MG PO TABS
ORAL_TABLET | ORAL | Status: AC
  Filled 2017-06-07: qty 2

## 2017-06-07 MED ORDER — GLYCOPYRROLATE 0.2 MG/ML IV SOSY
PREFILLED_SYRINGE | INTRAVENOUS | Status: AC
  Filled 2017-06-07: qty 5

## 2017-06-07 MED ORDER — ONDANSETRON HCL 4 MG/2ML IJ SOLN
4.0000 mg | Freq: Once | INTRAMUSCULAR | Status: DC | PRN
  Filled 2017-06-07: qty 2

## 2017-06-07 MED ORDER — DEXAMETHASONE SODIUM PHOSPHATE 10 MG/ML IJ SOLN
INTRAMUSCULAR | Status: AC
  Filled 2017-06-07: qty 1

## 2017-06-07 MED ORDER — ONDANSETRON HCL 4 MG/2ML IJ SOLN
INTRAMUSCULAR | Status: AC
  Filled 2017-06-07: qty 2

## 2017-06-07 MED ORDER — CIPROFLOXACIN IN D5W 400 MG/200ML IV SOLN
400.0000 mg | Freq: Once | INTRAVENOUS | Status: AC
  Administered 2017-06-07: 400 mg via INTRAVENOUS
  Filled 2017-06-07: qty 200

## 2017-06-07 MED ORDER — FENTANYL CITRATE (PF) 100 MCG/2ML IJ SOLN
25.0000 ug | INTRAMUSCULAR | Status: DC | PRN
  Filled 2017-06-07: qty 1

## 2017-06-07 MED ORDER — LACTATED RINGERS IV SOLN
INTRAVENOUS | Status: DC
  Administered 2017-06-07: 1000 mL via INTRAVENOUS
  Filled 2017-06-07: qty 1000

## 2017-06-07 MED ORDER — OXYCODONE HCL 5 MG PO TABS
ORAL_TABLET | ORAL | Status: AC
  Filled 2017-06-07: qty 1

## 2017-06-07 MED ORDER — OXYCODONE HCL 5 MG PO TABS
5.0000 mg | ORAL_TABLET | Freq: Once | ORAL | Status: AC | PRN
  Administered 2017-06-07: 5 mg via ORAL
  Filled 2017-06-07: qty 1

## 2017-06-07 MED ORDER — CIPROFLOXACIN IN D5W 400 MG/200ML IV SOLN
INTRAVENOUS | Status: AC
  Filled 2017-06-07: qty 200

## 2017-06-07 MED FILL — oxyCODONE HCL 5 MG TABS: 5 | 3 days supply | Qty: 20 | Fill #0

## 2017-06-07 MED FILL — ONDANSETRON ODT 4 MG TABLET: 4 | 10 days supply | Qty: 30 | Fill #0

## 2017-06-07 MED FILL — PHENAZOPYRIDINE 200 MG TAB: 200 | 10 days supply | Qty: 30 | Fill #0

## 2017-06-07 SURGICAL SUPPLY — 26 items
BAG DRAIN URO-CYSTO SKYTR STRL (DRAIN) ×3 IMPLANT
BASKET STONE 1.7 NGAGE (UROLOGICAL SUPPLIES) IMPLANT
BASKET ZERO TIP NITINOL 2.4FR (BASKET) IMPLANT
BENZOIN TINCTURE PRP APPL 2/3 (GAUZE/BANDAGES/DRESSINGS) ×3 IMPLANT
CATH INTERMIT  6FR 70CM (CATHETERS) IMPLANT
CLOSURE WOUND 1/2 X4 (GAUZE/BANDAGES/DRESSINGS) ×1
CLOTH BEACON ORANGE TIMEOUT ST (SAFETY) ×3 IMPLANT
FIBER LASER FLEXIVA 365 (UROLOGICAL SUPPLIES) IMPLANT
FIBER LASER TRAC TIP (UROLOGICAL SUPPLIES) IMPLANT
GLOVE BIO SURGEON STRL SZ7.5 (GLOVE) ×3 IMPLANT
GOWN STRL REUS W/TWL XL LVL3 (GOWN DISPOSABLE) ×3 IMPLANT
GUIDEWIRE ANG ZIPWIRE 038X150 (WIRE) ×3 IMPLANT
GUIDEWIRE STR DUAL SENSOR (WIRE) IMPLANT
INFUSOR MANOMETER BAG 3000ML (MISCELLANEOUS) ×3 IMPLANT
IV NS 1000ML (IV SOLUTION) ×2
IV NS 1000ML BAXH (IV SOLUTION) ×1 IMPLANT
IV NS IRRIG 3000ML ARTHROMATIC (IV SOLUTION) ×3 IMPLANT
KIT RM TURNOVER CYSTO AR (KITS) ×3 IMPLANT
MANIFOLD NEPTUNE II (INSTRUMENTS) ×3 IMPLANT
NS IRRIG 500ML POUR BTL (IV SOLUTION) IMPLANT
PACK CYSTO (CUSTOM PROCEDURE TRAY) ×3 IMPLANT
STENT URET 6FRX26 CONTOUR (STENTS) ×3 IMPLANT
STRIP CLOSURE SKIN 1/2X4 (GAUZE/BANDAGES/DRESSINGS) ×2 IMPLANT
SYRINGE 10CC LL (SYRINGE) ×3 IMPLANT
TUBE CONNECTING 12'X1/4 (SUCTIONS)
TUBE CONNECTING 12X1/4 (SUCTIONS) IMPLANT

## 2017-06-07 NOTE — Anesthesia Preprocedure Evaluation (Addendum)
Anesthesia Evaluation  Patient identified by MRN, date of birth, ID band Patient awake    Reviewed: Allergy & Precautions, NPO status , Patient's Chart, lab work & pertinent test results, reviewed documented beta blocker date and time   History of Anesthesia Complications Negative for: history of anesthetic complications  Airway Mallampati: II  TM Distance: >3 FB Neck ROM: Full    Dental  (+) Dental Advisory Given, Teeth Intact   Pulmonary sleep apnea and Continuous Positive Airway Pressure Ventilation ,    breath sounds clear to auscultation       Cardiovascular hypertension, Pt. on medications and Pt. on home beta blockers  Rhythm:Regular Rate:Normal     Neuro/Psych PSYCHIATRIC DISORDERS Anxiety  Narcolepsy w/ cataplexy - no episodes recently    GI/Hepatic Neg liver ROS, GERD  Controlled and Medicated,  Endo/Other  diabetes, Type 2, Oral Hypoglycemic AgentsObesity  Renal/GU CRFRenal disease  negative genitourinary   Musculoskeletal negative musculoskeletal ROS (+)   Abdominal   Peds negative pediatric ROS (+)  Hematology negative hematology ROS (+)   Anesthesia Other Findings   Reproductive/Obstetrics negative OB ROS                           Anesthesia Physical  Anesthesia Plan  ASA: III  Anesthesia Plan: General   Post-op Pain Management:    Induction: Intravenous  PONV Risk Score and Plan: 2 and Ondansetron, Dexamethasone and Treatment may vary due to age or medical condition  Airway Management Planned: LMA  Additional Equipment: None  Intra-op Plan:   Post-operative Plan: Extubation in OR  Informed Consent: I have reviewed the patients History and Physical, chart, labs and discussed the procedure including the risks, benefits and alternatives for the proposed anesthesia with the patient or authorized representative who has indicated his/her understanding and acceptance.    Dental advisory given  Plan Discussed with: CRNA  Anesthesia Plan Comments: (ANAPHYLAXIS to NSAIDS)       Anesthesia Quick Evaluation

## 2017-06-07 NOTE — Op Note (Signed)
Operative Note  Preoperative diagnosis:  1.  4 mm left midureteral stone  Postoperative diagnosis: 1.  Same  Procedure(s): 1.  Cystoscopy with left JJ stent removal 2.  Left ureteroscopy 3.  Holmium laser lithotripsy 4.  Left JJ stent placement  Surgeon: Ellison Hughs, MD  Assistants: None  Anesthesia: General LMA  Complications: None  EBL: Less than 5 mL  Specimens: 1.  Left ureteral stone  Drains/Catheters: 1.  Left 6 French by 26 cm JJ stent with tether  Intraoperative findings:   1.  4 mm left mid ureteral stone  Indication:  William Booth is a 67 y.o. male who initially presented to the office with a 72 hour history of intermittent, sharp left flank pain associated with nausea and vomiting.  He presented to the emergency department on 05/24/2017 with acute onset of left-sided flank pain. He had a CT stone study that showed an obstructing 4 mm left midureteral stone with perinephric stranding. He underwent cystoscopy with left JJ stent placement on 05/27/17 and is here today for definitive stone treatment.  Description of procedure:  After informed consent was obtained, the patient was brought to the operating room and general LMA anesthesia was administered. The patient was then placed in the dorsolithotomy position and prepped and draped in usual sterile fashion. A timeout was performed. A 21 French rigid cystoscope was then inserted into the urethral meatus and advanced into the bladder under direct vision. A complete bladder survey revealed no intravesical pathology.  His previously placed left JJ stent was then grasped and retracted to the urethral meatus.  A Glidewire was then used to intubate the lumen of the JJ stent and was advanced up to the left renal pelvis, under fluoroscopic guidance.  The previously placed stent was then removed over the wire, inspected and discarded.  The rigid cystoscope was then exchanged for a semirigid ureteroscope, which was  advanced up the left ureter until his obstructing mid ureteral stone is identified.  A 365 m holmium laser was then used to fracture the stone into several smaller pieces.  A 0 tip basket was then used to extract all stone fragments from the lumen of the left ureter.  The semirigid ureteroscope was then removed under direct vision.  The rigid cystoscope was then advanced back into the bladder over the wire.  A 6 [place] JJ stent was then placed over the wire and into good position within the left collecting system, confirming placement via fluoroscopy.  The tether of the stent was left intact.  All stone fragments were then drained from the bladder through the cystoscope sheath.  The patient tolerated the procedure well and was transferred to the postanesthesia unit in stable condition.  Plan: The patient has been instructed to remove his stent, which is on a tether, at 8 AM on 06/10/2017.  He will follow-up in the office in 6 weeks for a left renal ultrasound.

## 2017-06-07 NOTE — Transfer of Care (Signed)
Immediate Anesthesia Transfer of Care Note  Patient: William Booth  Procedure(s) Performed: Procedure(s) (LRB): CYSTOSCOPY/URETEROSCOPY/HOLMIUM LASER/STENT PLACEMENT (Left)  Patient Location: PACU  Anesthesia Type: General  Level of Consciousness: awake, oriented, sedated and patient cooperative  Airway & Oxygen Therapy: Patient Spontanous Breathing and Patient connected to face mask oxygen  Post-op Assessment: Report given to PACU RN and Post -op Vital signs reviewed and stable  Post vital signs: Reviewed and stable  Complications: No apparent anesthesia complications  Last Vitals:  Vitals:   06/07/17 0910 06/07/17 1215  BP: 114/74   Pulse: 62 (P) 63  Resp: 18 (P) 12  Temp: 36.9 C (P) 36.6 C  SpO2: 100%     Last Pain:  Vitals:   06/07/17 0910  TempSrc: Oral      Patients Stated Pain Goal: 5 (06/07/17 0935)

## 2017-06-07 NOTE — Interval H&P Note (Signed)
History and Physical Interval Note:  06/07/2017 11:33 AM  William Booth  has presented today for surgery, with the diagnosis of LEFT URETERAL STONE  The various methods of treatment have been discussed with the patient and family. After consideration of risks, benefits and other options for treatment, the patient has consented to  Procedure(s) with comments: CYSTOSCOPY/URETEROSCOPY/HOLMIUM LASER/STENT PLACEMENT (Left) - ONLY NEEDS 30 MIN FOR PROCEDURE as a surgical intervention .  The patient's history has been reviewed, patient examined, no change in status, stable for surgery.  I have reviewed the patient's chart and labs.  Questions were answered to the patient's satisfaction.     Conception Oms Winter

## 2017-06-07 NOTE — Anesthesia Postprocedure Evaluation (Signed)
Anesthesia Post Note  Patient: William Booth  Procedure(s) Performed: CYSTOSCOPY/URETEROSCOPY/HOLMIUM LASER/STENT PLACEMENT (Left )     Patient location during evaluation: PACU Anesthesia Type: General Level of consciousness: awake and alert Pain management: pain level controlled Vital Signs Assessment: post-procedure vital signs reviewed and stable Respiratory status: spontaneous breathing, nonlabored ventilation and respiratory function stable Cardiovascular status: blood pressure returned to baseline and stable Postop Assessment: no apparent nausea or vomiting Anesthetic complications: no    Last Vitals:  Vitals:   06/07/17 1245 06/07/17 1355  BP: 127/83 (!) 141/80  Pulse: (!) 57 (!) 46  Resp: 20 19  Temp:  36.6 C  SpO2: 100% 99%    Last Pain:  Vitals:   06/07/17 1359  TempSrc:   PainSc: [place]

## 2017-06-07 NOTE — Anesthesia Procedure Notes (Signed)
Procedure Name: LMA Insertion Date/Time: 06/07/2017 11:38 AM Performed by: Suan Halter, CRNA Pre-anesthesia Checklist: Patient identified, Emergency Drugs available, Suction available and Patient being monitored Patient Re-evaluated:Patient Re-evaluated prior to induction Oxygen Delivery Method: Circle system utilized Preoxygenation: Pre-oxygenation with 100% oxygen Induction Type: IV induction Ventilation: Mask ventilation without difficulty LMA: LMA inserted LMA Size: 4.0 Number of attempts: 1 Airway Equipment and Method: Bite block Placement Confirmation: positive ETCO2 Tube secured with: Tape Dental Injury: Teeth and Oropharynx as per pre-operative assessment

## 2017-06-07 NOTE — Discharge Instructions (Signed)
[facility] °[phone] °Post Ureteroscopy With or Without Stent Instructions ° °Definitions: ° °Ureter: The duct that transports urine from the kidney to the bladder. °Stent:   A plastic hollow tube that is placed into the ureter, from the kidney to the                 bladder to prevent the ureter from swelling shut. ° °GENERAL INSTRUCTIONS: ° °Despite the fact that no skin incisions were used, the area around the ureter and bladder is raw and irritated. The stent is a foreign body which will further irritate the bladder wall. This irritation is manifested by increased frequency of urination, both day and night, and by an increase in the urge to urinate. In some, the urge to urinate is present almost always. Sometimes the urge is strong enough that you may not be able to stop yourself from urinating. The only real cure is to remove the stent and then give time for the bladder wall to heal which can't be done until the danger of the ureter swelling shut has passed, which varies. ° °You may see some blood in your urine while the stent is in place and a few days afterwards. Do not be alarmed, even if the urine was clear for a while. Get off your feet and drink lots of fluids until clearing occurs. If you start to pass clots or don't improve, call us. ° °DIET: °You may return to your normal diet immediately. Because of the raw surface of your bladder, alcohol, spicy foods, acid type foods and drinks with caffeine may cause irritation or frequency and should be used in moderation. To keep your urine flowing freely and to avoid constipation, drink plenty of fluids during the day ( 8-10 glasses ). °Tip: Avoid cranberry juice because it is very acidic. ° °ACTIVITY: °Your physical activity doesn't need to be restricted. However, if you are very active, you may see some blood in your urine. We suggest that you reduce your activity under these circumstances until the bleeding has stopped. ° °BOWELS: °It is  important to keep your bowels regular during the postoperative period. Straining with bowel movements can cause bleeding. A bowel movement every other day is reasonable. Use a mild laxative if needed, such as Milk of Magnesia 2-3 tablespoons, or 2 Dulcolax tablets. Call if you continue to have problems. If you have been taking narcotics for pain, before, during or after your surgery, you may be constipated. Take a laxative if necessary. ° ° °MEDICATION: °You should resume your pre-surgery medications unless told not to. In addition you will often be given an antibiotic to prevent infection. These should be taken as prescribed until the bottles are finished unless you are having an unusual reaction to one of the drugs. ° °PROBLEMS YOU SHOULD REPORT TO US: °· Fevers over 100.5 Fahrenheit. °· Heavy bleeding, or clots ( See above notes about blood in urine ). °· Inability to urinate. °· Drug reactions ( hives, rash, nausea, vomiting, diarrhea ). °· Severe burning or pain with urination that is not improving. ° °FOLLOW-UP: °You will need a follow-up appointment to monitor your progress. Call for this appointment at the number listed above. Usually the first appointment will be about three to fourteen days after your surgery. ° ° ° ° ° °Post Anesthesia Home Care Instructions ° °Activity: °Get plenty of rest for the remainder of the day. A responsible individual must stay with you for 24 hours following the procedure.  °  For the next 24 hours, DO NOT: -Drive a car -Paediatric nurse -Drink alcoholic beverages -Take any medication unless instructed by your physician -Make any legal decisions or sign important papers.  Meals: Start with liquid foods such as gelatin or soup. Progress to regular foods as tolerated. Avoid greasy, spicy, heavy foods. If nausea and/or vomiting occur, drink only clear liquids until the nausea and/or vomiting subsides. Call your physician if vomiting continues.  Special  Instructions/Symptoms: Your throat may feel dry or sore from the anesthesia or the breathing tube placed in your throat during surgery. If this causes discomfort, gargle with warm salt water. The discomfort should disappear within 24 hours.    Remove stent on Monday 06/10/2017

## 2017-06-10 ENCOUNTER — Encounter (HOSPITAL_BASED_OUTPATIENT_CLINIC_OR_DEPARTMENT_OTHER): Payer: Self-pay | Admitting: Urology

## 2017-06-13 MED FILL — TRULICITY 0.75 MG/0.5 ML PE: 0.75 | 84 days supply | Qty: 6 | Fill #0

## 2017-06-20 DIAGNOSIS — E782 Mixed hyperlipidemia: Secondary | ICD-10-CM | POA: Diagnosis not present

## 2017-06-20 DIAGNOSIS — G4730 Sleep apnea, unspecified: Secondary | ICD-10-CM | POA: Diagnosis not present

## 2017-06-20 DIAGNOSIS — F419 Anxiety disorder, unspecified: Secondary | ICD-10-CM | POA: Diagnosis not present

## 2017-06-20 DIAGNOSIS — Z0000 Encounter for general adult medical examination without abnormal findings: Secondary | ICD-10-CM | POA: Diagnosis not present

## 2017-06-20 DIAGNOSIS — Z86010 Personal history of colonic polyps: Secondary | ICD-10-CM | POA: Diagnosis not present

## 2017-06-20 DIAGNOSIS — Z7984 Long term (current) use of oral hypoglycemic drugs: Secondary | ICD-10-CM | POA: Diagnosis not present

## 2017-06-20 DIAGNOSIS — E1140 Type 2 diabetes mellitus with diabetic neuropathy, unspecified: Secondary | ICD-10-CM | POA: Diagnosis not present

## 2017-06-20 DIAGNOSIS — I10 Essential (primary) hypertension: Secondary | ICD-10-CM | POA: Diagnosis not present

## 2017-06-20 DIAGNOSIS — K219 Gastro-esophageal reflux disease without esophagitis: Secondary | ICD-10-CM | POA: Diagnosis not present

## 2017-06-20 DIAGNOSIS — N183 Chronic kidney disease, stage 3 (moderate): Secondary | ICD-10-CM | POA: Diagnosis not present

## 2017-06-20 MED FILL — ALPRAZolam 0.5 MG TABS: 0.5 | 90 days supply | Qty: 180 | Fill #0

## 2017-06-21 MED FILL — AMITRIPTYLINE HCL 100 MG TA: 100 | 90 days supply | Qty: 90 | Fill #0

## 2017-06-21 MED FILL — FENOFIBRATE 160 MG TABLET: 160 | 90 days supply | Qty: 90 | Fill #0

## 2017-06-21 MED FILL — GLIMEPIRIDE 4 MG TABLET: 4 | 90 days supply | Qty: 180 | Fill #0

## 2017-06-21 MED FILL — ATENOLOL 50 MG TABLET: 50 | 90 days supply | Qty: 180 | Fill #0

## 2017-06-21 MED FILL — BUPROPION HCL XL 300 MG TAB: 300 | 90 days supply | Qty: 90 | Fill #0

## 2017-06-21 MED FILL — metFORMIN HCL 500 MG TABS: 500 | 90 days supply | Qty: 360 | Fill #0

## 2017-06-21 MED FILL — ROSUVASTATIN CALCIUM 40 MG: 40 | 90 days supply | Qty: 90 | Fill #0

## 2017-06-21 MED FILL — EZETIMIBE 10 MG TABS: 10 | 90 days supply | Qty: 90 | Fill #0

## 2017-06-21 MED FILL — OMEPRAZOLE DR 40 MG CAPSULE: 40 | 90 days supply | Qty: 90 | Fill #0

## 2017-07-15 DIAGNOSIS — N201 Calculus of ureter: Secondary | ICD-10-CM | POA: Diagnosis not present

## 2017-07-15 DIAGNOSIS — R1084 Generalized abdominal pain: Secondary | ICD-10-CM | POA: Diagnosis not present

## 2017-07-22 DIAGNOSIS — M545 Low back pain: Secondary | ICD-10-CM | POA: Diagnosis not present

## 2017-07-22 MED FILL — predniSONE 10 MG TABS: 10 | 12 days supply | Qty: 21 | Fill #0

## 2017-09-18 MED FILL — ATENOLOL 50 MG TABLET: 50 | 90 days supply | Qty: 180 | Fill #1

## 2017-09-18 MED FILL — OMEPRAZOLE 40 MG CPDR: 40 | 90 days supply | Qty: 90 | Fill #1

## 2017-09-18 MED FILL — metFORMIN HCL 500 MG TABS: 500 | 90 days supply | Qty: 360 | Fill #1

## 2017-09-18 MED FILL — GLIMEPIRIDE 4 MG TABLET: 4 | 90 days supply | Qty: 180 | Fill #1

## 2017-09-18 MED FILL — ROSUVASTATIN CALCIUM 40 MG: 40 | 90 days supply | Qty: 90 | Fill #1

## 2017-09-18 MED FILL — FENOFIBRATE 160 MG TABLET: 160 | 90 days supply | Qty: 90 | Fill #1

## 2017-09-18 MED FILL — TRULICITY 0.75 MG/0.5 ML PE: 0.75 | 84 days supply | Qty: 6 | Fill #0

## 2017-09-18 MED FILL — ALPRAZolam 0.5 MG TABS: 0.5 | 90 days supply | Qty: 180 | Fill #0

## 2017-09-18 MED FILL — AMITRIPTYLINE HCL 100 MG TA: 100 | 90 days supply | Qty: 90 | Fill #1

## 2017-09-18 MED FILL — buPROPion HCL ER (XL) 300 M: 300 | 90 days supply | Qty: 90 | Fill #1

## 2017-09-18 MED FILL — EZETIMIBE 10 MG TABS: 10 | 90 days supply | Qty: 90 | Fill #1

## 2017-10-18 DIAGNOSIS — Z23 Encounter for immunization: Secondary | ICD-10-CM | POA: Diagnosis not present

## 2017-11-26 ENCOUNTER — Telehealth: Payer: Self-pay | Admitting: Internal Medicine

## 2017-11-26 NOTE — Telephone Encounter (Signed)
Pt requesting a refill of adderall 30mg  tablet.  Pt last seen at office 04/12/17 and med last filled 04/12/17, #90 with 0 RF.  Dr. Annamaria Boots, please advise if it is okay to fill med for pt. Thanks!  Allergies  Allergen Reactions  . Acetaminophen Anaphylaxis  . Nsaids Hives and Rash    Face only will spread if not caught early.  Can take ASA     Current Outpatient Medications:  .  ALPRAZolam (XANAX) 0.5 MG tablet, Take 0.5 mg by mouth 2 (two) times daily as needed for anxiety. , Disp: , Rfl:  .  amitriptyline (ELAVIL) 100 MG tablet, Take 100 mg by mouth at bedtime as needed for sleep. , Disp: , Rfl:  .  amphetamine-dextroamphetamine (ADDERALL XR) 30 MG 24 hr capsule, Take 1 capsule (30 mg total) by mouth daily., Disp: 90 capsule, Rfl: 0 .  atenolol (TENORMIN) 50 MG tablet, Take 50 mg by mouth 2 (two) times daily., Disp: , Rfl:  .  buPROPion (WELLBUTRIN XL) 150 MG 24 hr tablet, Take 2 tablets (300 mg total) by mouth daily., Disp: , Rfl:  .  Cholecalciferol (VITAMIN D-3) 5000 units TABS, Take 1 tablet by mouth daily., Disp: , Rfl:  .  docusate sodium (COLACE) 100 MG capsule, Take 1 capsule (100 mg total) by mouth every 12 (twelve) hours., Disp: 60 capsule, Rfl: 0 .  escitalopram (LEXAPRO) 10 MG tablet, Take 10 mg by mouth daily., Disp: , Rfl: 3 .  fenofibrate 160 MG tablet, Take 160 mg by mouth daily., Disp: , Rfl:  .  glimepiride (AMARYL) 4 MG tablet, Take 2 mg by mouth 2 (two) times daily. , Disp: , Rfl:  .  metFORMIN (GLUCOPHAGE) 500 MG tablet, Take 2 tablets (1,000 mg total) by mouth 2 (two) times daily., Disp: , Rfl: 0 .  omega-3 acid ethyl esters (LOVAZA) 1 G capsule, Take 1 g by mouth daily. , Disp: , Rfl:  .  ondansetron (ZOFRAN ODT) 4 MG disintegrating tablet, Take 1 tablet (4 mg total) by mouth every 8 (eight) hours as needed for nausea or vomiting., Disp: 30 tablet, Rfl: 0 .  oxyCODONE (OXY IR/ROXICODONE) 5 MG immediate release tablet, Take 1 tablet (5 mg total) by mouth every 4  (four) hours as needed for severe pain., Disp: 20 tablet, Rfl: 0 .  phenazopyridine (PYRIDIUM) 200 MG tablet, Take 1 tablet (200 mg total) by mouth 3 (three) times daily as needed for pain., Disp: 30 tablet, Rfl: 0 .  rosuvastatin (CRESTOR) 40 MG tablet, Take 40 mg by mouth daily., Disp: , Rfl: 1 .  saccharomyces boulardii (FLORASTOR) 250 MG capsule, Take 1 capsule (250 mg total) by mouth 2 (two) times daily., Disp: 30 capsule, Rfl: 0 .  TRULICITY 1.43 OO/8.7NZ SOPN, Inject weekly, Disp: , Rfl:  .  vitamin B-12 (CYANOCOBALAMIN) 1000 MCG tablet, Take 1,000 mcg by mouth daily., Disp: , Rfl:

## 2017-11-26 NOTE — Telephone Encounter (Signed)
Ok to refill 

## 2017-11-27 MED ORDER — AMPHETAMINE-DEXTROAMPHET ER 30 MG PO CP24: 30.0000 mg | ORAL_CAPSULE | Freq: Every day | ORAL | 0 refills | Status: DC

## 2017-11-27 NOTE — Telephone Encounter (Signed)
Pt is calling back [phone]

## 2017-11-27 NOTE — Telephone Encounter (Signed)
Attempted to call pt. I did not receive an answer. I have left a message for pt to return our call.  

## 2017-11-27 NOTE — Telephone Encounter (Signed)
Called and spoke with pt regarding Adderrall 30mg  once daily rx Printed rx today, placed on CY desk to sign

## 2017-11-29 NOTE — Telephone Encounter (Signed)
Rx was signed and personally handed to the pt on 11/28/17. Nothing further was needed.

## 2017-12-02 DIAGNOSIS — R0781 Pleurodynia: Secondary | ICD-10-CM | POA: Diagnosis not present

## 2017-12-02 DIAGNOSIS — M545 Low back pain: Secondary | ICD-10-CM | POA: Diagnosis not present

## 2017-12-02 MED FILL — oxyCODONE HCL 10 MG TABS: 10 | 10 days supply | Qty: 40 | Fill #0

## 2017-12-02 MED FILL — predniSONE 10 MG TABS: 10 | 12 days supply | Qty: 21 | Fill #0

## 2017-12-04 MED FILL — TRULICITY 0.75 MG/0.5 ML PE: 0.75 | 84 days supply | Qty: 6 | Fill #0

## 2017-12-11 MED FILL — ADDERALL XR 30 MG CAP SA: 30 | 90 days supply | Qty: 90 | Fill #0

## 2018-01-02 DIAGNOSIS — E782 Mixed hyperlipidemia: Secondary | ICD-10-CM | POA: Diagnosis not present

## 2018-01-02 DIAGNOSIS — G629 Polyneuropathy, unspecified: Secondary | ICD-10-CM | POA: Diagnosis not present

## 2018-01-02 DIAGNOSIS — F419 Anxiety disorder, unspecified: Secondary | ICD-10-CM | POA: Diagnosis not present

## 2018-01-02 DIAGNOSIS — N183 Chronic kidney disease, stage 3 (moderate): Secondary | ICD-10-CM | POA: Diagnosis not present

## 2018-01-02 DIAGNOSIS — Z0000 Encounter for general adult medical examination without abnormal findings: Secondary | ICD-10-CM | POA: Diagnosis not present

## 2018-01-02 DIAGNOSIS — G4730 Sleep apnea, unspecified: Secondary | ICD-10-CM | POA: Diagnosis not present

## 2018-01-02 DIAGNOSIS — E1122 Type 2 diabetes mellitus with diabetic chronic kidney disease: Secondary | ICD-10-CM | POA: Diagnosis not present

## 2018-01-02 DIAGNOSIS — Z86010 Personal history of colonic polyps: Secondary | ICD-10-CM | POA: Diagnosis not present

## 2018-01-02 DIAGNOSIS — I10 Essential (primary) hypertension: Secondary | ICD-10-CM | POA: Diagnosis not present

## 2018-01-02 DIAGNOSIS — Z23 Encounter for immunization: Secondary | ICD-10-CM | POA: Diagnosis not present

## 2018-03-13 ENCOUNTER — Other Ambulatory Visit: Payer: Self-pay

## 2018-03-13 DIAGNOSIS — D485 Neoplasm of uncertain behavior of skin: Secondary | ICD-10-CM | POA: Diagnosis not present

## 2018-03-13 DIAGNOSIS — D229 Melanocytic nevi, unspecified: Secondary | ICD-10-CM | POA: Diagnosis not present

## 2018-03-13 DIAGNOSIS — D3611 Benign neoplasm of peripheral nerves and autonomic nervous system of face, head, and neck: Secondary | ICD-10-CM | POA: Diagnosis not present

## 2018-03-13 DIAGNOSIS — D692 Other nonthrombocytopenic purpura: Secondary | ICD-10-CM | POA: Diagnosis not present

## 2018-04-01 MED FILL — ALPRAZolam 0.5 MG TABS: 0.5 | 90 days supply | Qty: 180 | Fill #0

## 2018-04-01 MED FILL — TRULICITY 0.75 MG/0.5 ML PE: 0.75 | 84 days supply | Qty: 6 | Fill #0

## 2018-04-14 ENCOUNTER — Ambulatory Visit (INDEPENDENT_AMBULATORY_CARE_PROVIDER_SITE_OTHER): Payer: 59 | Admitting: Internal Medicine

## 2018-04-14 ENCOUNTER — Encounter: Payer: Self-pay | Admitting: Internal Medicine

## 2018-04-14 VITALS — BP 122/74 | HR 58 | Ht 70.0 in | Wt 235.0 lb

## 2018-04-14 DIAGNOSIS — G4733 Obstructive sleep apnea (adult) (pediatric): Secondary | ICD-10-CM

## 2018-04-14 MED ORDER — AMPHETAMINE-DEXTROAMPHET ER 30 MG PO CP24: 30.0000 mg | ORAL_CAPSULE | Freq: Every day | ORAL | 0 refills | Status: DC

## 2018-04-14 NOTE — Patient Instructions (Addendum)
Order- [facility]   Please replace old CPAP machine, changing to auto 10-20, mask of choice, humidifier, supplies, AirView  Script printed refilling adderall  Please call if I can help

## 2018-04-14 NOTE — Progress Notes (Signed)
HPI  male never smoker followed for OSA, history gastric banding 6440, complicated by HBP, GERD,  DM 2, hypothyroid, bariatric lap band surgery 2007, kidney stones   -----------------------------------------------------  04/12/17- -67 year old male never smoker followed for OSA, history gastric banding 3474, complicated by HBP, GERD, DM 2, hypothyroid, bariatric lap band surgery 2007, HBP ---OSA; DME: [facility]. Pt wears CPAP nightly and DL attached. No new supplies needed at this time. Pt will need  CPAP 12/[facility] Rx printed for Aderall XR.  Adderall XR 30 mg, bupropion XL 150 mg, Lexapro 10 mg Download compliance 86%,, AHI 0.3/hour.  Pulse mask off sometimes in his sleep but feels he cannot sleep without it.  Still needs outer row to function well in the daytime-discussed. Taking a retirement Charity fundraiser.  04/14/2018- 67 year old male never smoker followed for OSA, history gastric banding 2595, complicated by HBP, GERD, DM 2, hypothyroid, bariatric lap band surgery 2007, HBP -----OSA; DME: Pt wears CPAP nihgtly and DL attached. Body weight today 235 pounds CPAP 12/[facility]>> today replace old machine, change to auto 10-20 Download 90% compliance AHI 0.7/hour. Does well with CPAP but old machine needs replacing. Continues to use Adderall occasionally for residual daytime fatigue but understands to take naps.  ROS-see HPI + = positive Constitutional:    weight loss, No-night sweats, fevers, chills, fatigue, lassitude. HEENT:   No-  headaches, difficulty swallowing, tooth/dental problems, sore throat,       No-  sneezing, itching, ear ache, nasal congestion, post nasal drip,  CV:  No-   chest pain, orthopnea, PND, swelling in lower extremities, anasarca,  dizziness, palpitations Resp: No-   shortness of breath with exertion or at rest.              No-   productive cough,  No non-productive cough,  No- coughing up of blood.              No-   change in color of mucus.  No- wheezing.   Skin: No-    rash or lesions. GI:  No-   heartburn, indigestion, abdominal pain, nausea, vomiting,  GU: MS:  No-   joint pain or swelling.   Neuro-     nothing unusual Psych:  No- change in mood or affect. No depression or anxiety.  No memory loss.  OBJ- Physical Exam General- Alert, Oriented, Affect-appropriate, Distress- none acute, +overweight Skin- rash-none, lesions- none, excoriation- none Lymphadenopathy- none Head- atraumatic            Eyes- Gross vision intact, PERRLA, conjunctivae and secretions clear            Ears- Hearing, canals-normal            Nose- Clear, no-Septal dev, mucus, polyps, erosion, perforation             Throat- Mallampati III , mucosa clear , drainage- none, tonsils- atrophic Neck- flexible , trachea midline, no stridor , thyroid nl, carotid no bruit Chest - symmetrical excursion , unlabored           Heart/CV- RRR , no murmur , no gallop  , no rub, nl s1 s2                           - JVD- none , edema- none, stasis changes- none, varices- none           Lung- clear to P&A, wheeze- none, cough- none , dullness-none, rub- none  Chest wall-  Abd-  Br/ Gen/ Rectal- Not done, not indicated Extrem- cyanosis- none, clubbing, none, atrophy- none, strength- nl Neuro- grossly intact to observation

## 2018-04-17 DIAGNOSIS — D229 Melanocytic nevi, unspecified: Secondary | ICD-10-CM | POA: Diagnosis not present

## 2018-04-17 DIAGNOSIS — L720 Epidermal cyst: Secondary | ICD-10-CM | POA: Diagnosis not present

## 2018-04-18 MED FILL — ATENOLOL 50 MG TABLET: 50 | 90 days supply | Qty: 180 | Fill #0

## 2018-04-18 MED FILL — GLIMEPIRIDE 4 MG TABLET: 4 | 90 days supply | Qty: 180 | Fill #0

## 2018-04-18 MED FILL — ROSUVASTATIN CALCIUM 40 MG: 40 | 90 days supply | Qty: 90 | Fill #0

## 2018-04-18 MED FILL — OMEPRAZOLE 40 MG CPDR: 40 | 90 days supply | Qty: 90 | Fill #0

## 2018-04-24 ENCOUNTER — Other Ambulatory Visit: Payer: Self-pay | Admitting: Dermatology

## 2018-04-24 DIAGNOSIS — D3611 Benign neoplasm of peripheral nerves and autonomic nervous system of face, head, and neck: Secondary | ICD-10-CM | POA: Diagnosis not present

## 2018-04-24 MED FILL — ADDERALL XR 30 MG CAP SA: 30 | 90 days supply | Qty: 90 | Fill #0

## 2018-04-25 MED FILL — ONDANSETRON HCL 4 MG TABLET: 4 | 5 days supply | Qty: 30 | Fill #1

## 2018-04-29 DIAGNOSIS — G4733 Obstructive sleep apnea (adult) (pediatric): Secondary | ICD-10-CM | POA: Diagnosis not present

## 2018-05-30 DIAGNOSIS — G4733 Obstructive sleep apnea (adult) (pediatric): Secondary | ICD-10-CM | POA: Diagnosis not present

## 2018-05-30 IMAGING — CT CT RENAL STONE PROTOCOL
2 of 4 series · 17 of 46 positions shown, 19 images · non-contrast
Comparison: None.

CLINICAL DATA: Left lower quadrant pain, back pain

EXAM:
CT ABDOMEN AND PELVIS WITHOUT CONTRAST
TECHNIQUE: Multidetector CT imaging of the abdomen and pelvis was performed
following the standard protocol without IV contrast.

[Series 2: axial st · axial · 0.98mm/px · z∈[+698,+1173]mm · 14 of 105 slices shown, 16 images]
[im 5/105  soft-tissue]
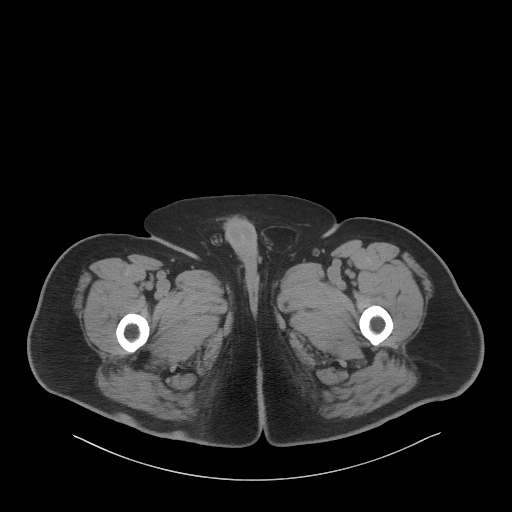
[im 5/105  bone]
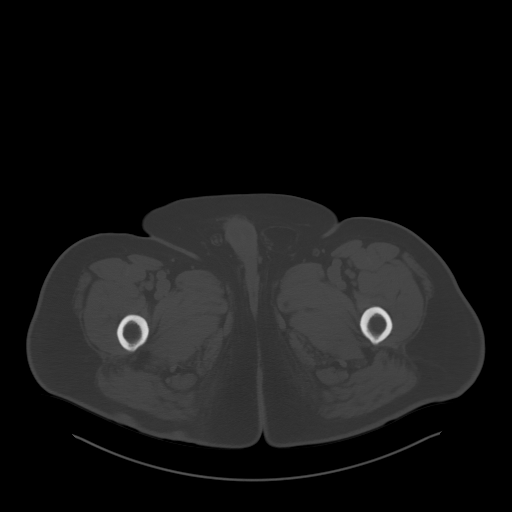
[im 14/105  soft-tissue]
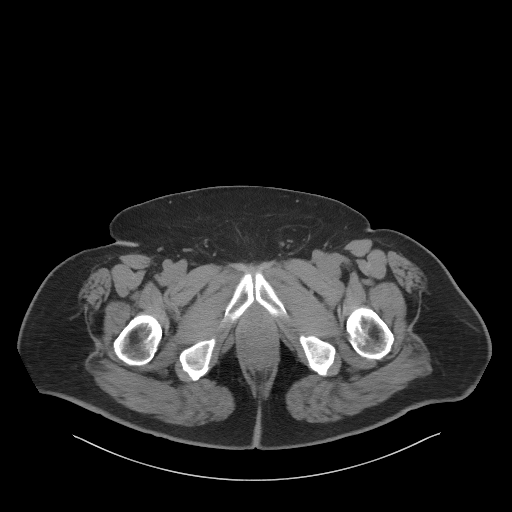
[im 22/105  soft-tissue]
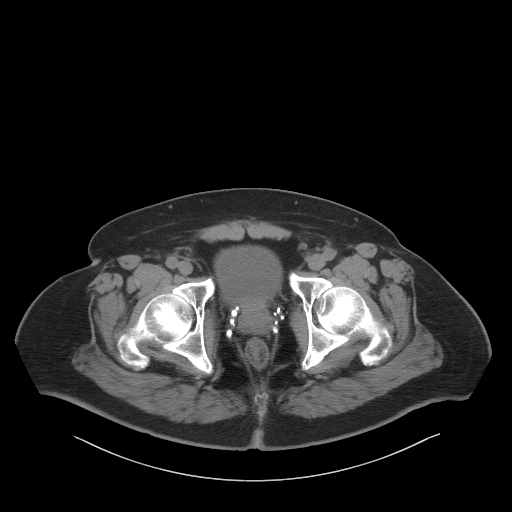
[im 27/105  soft-tissue]
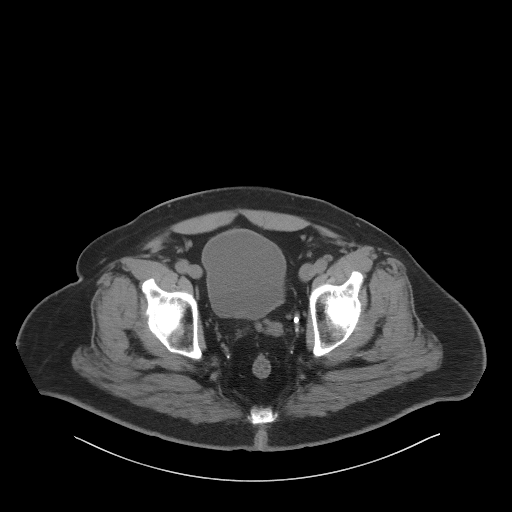
[im 35/105  soft-tissue]
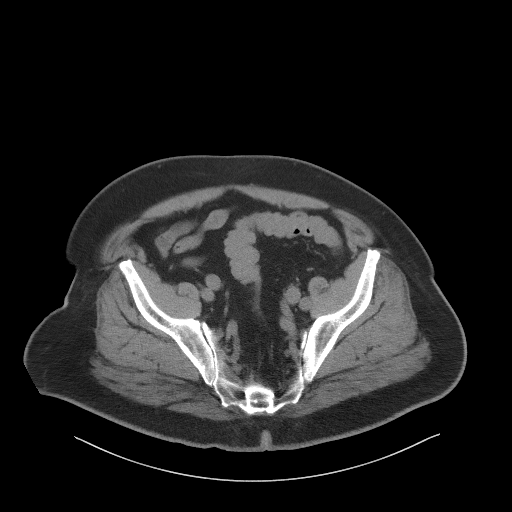
[im 44/105  soft-tissue]
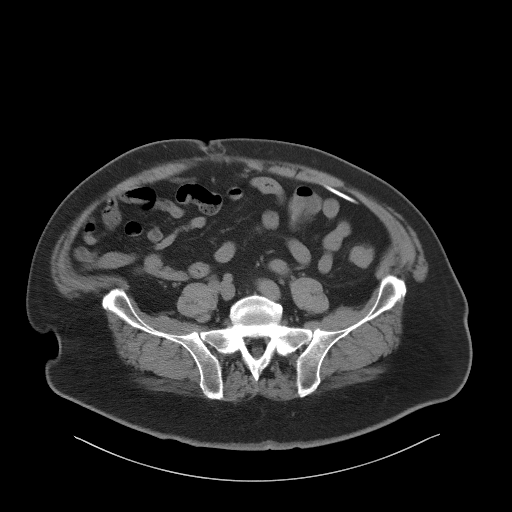
[im 48/105  soft-tissue]
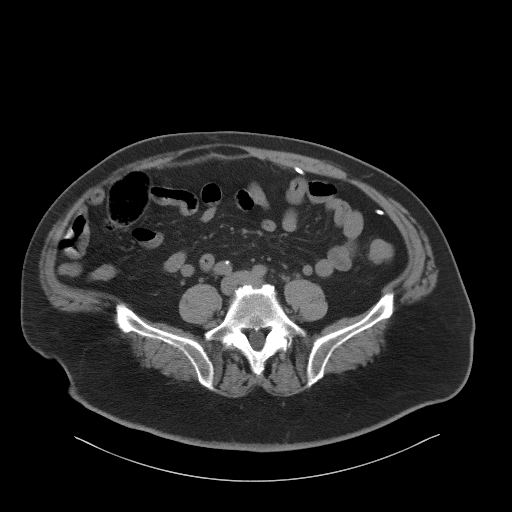
[im 57/105  soft-tissue]
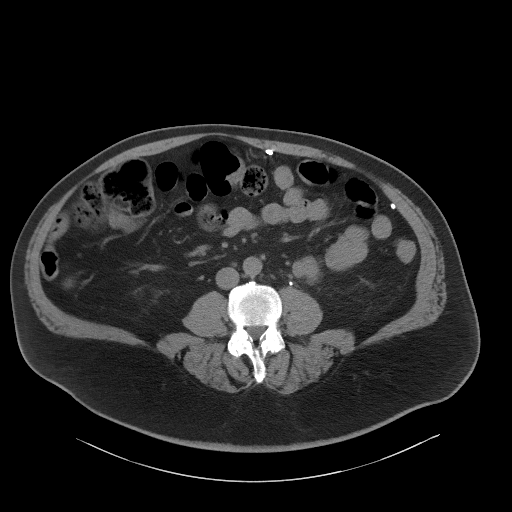
[im 61/105  soft-tissue]
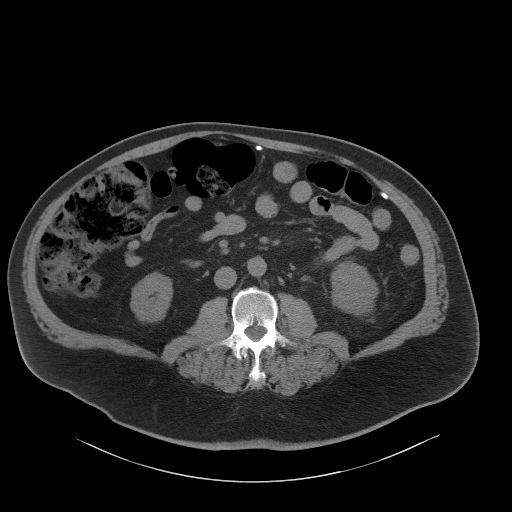
[im 61/105  bone]
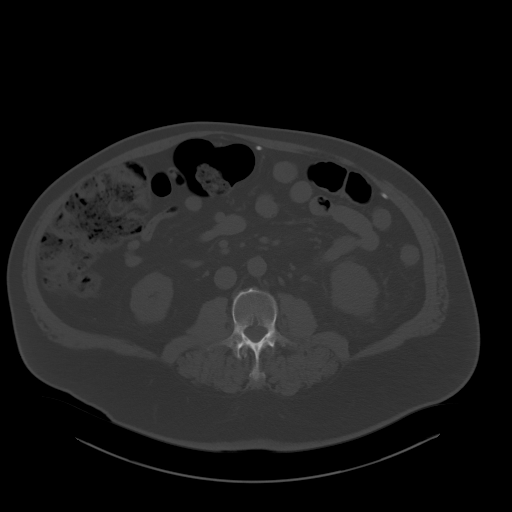
[im 70/105  soft-tissue]
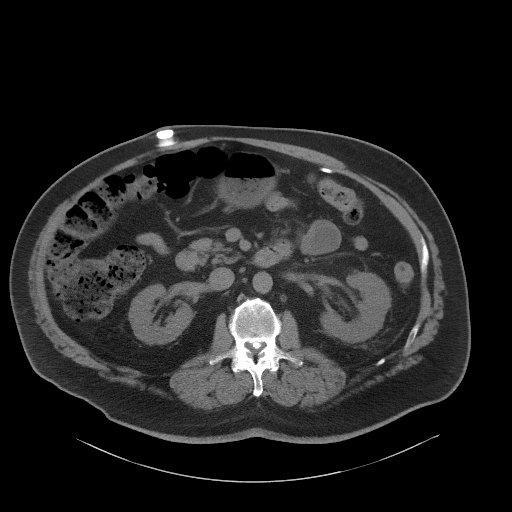
[im 79/105  soft-tissue]
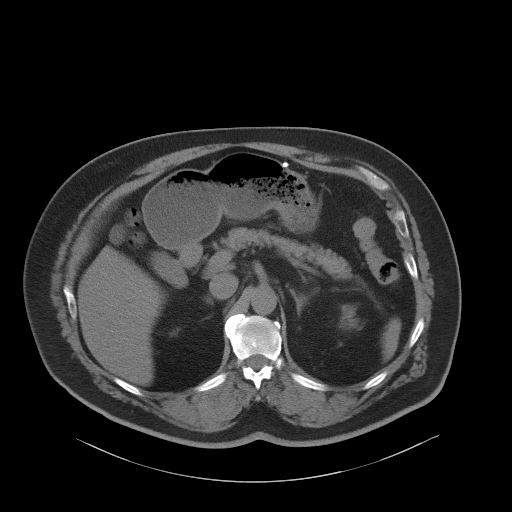
[im 83/105  soft-tissue]
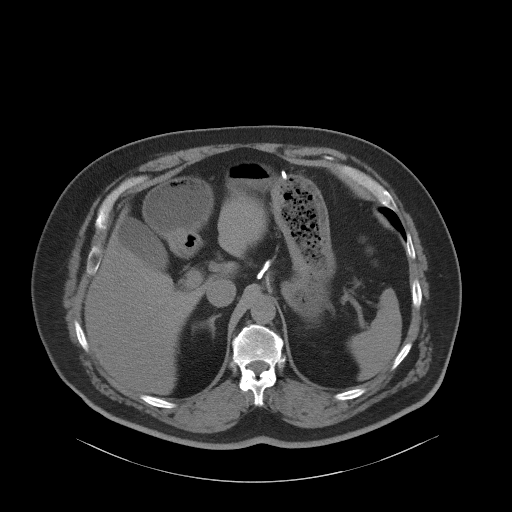
[im 92/105  soft-tissue]
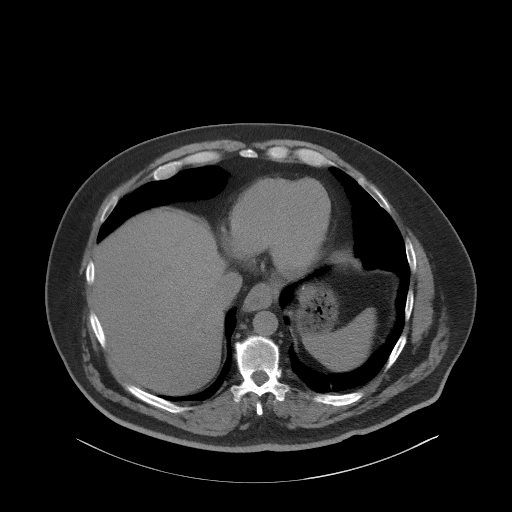
[im 100/105  soft-tissue]
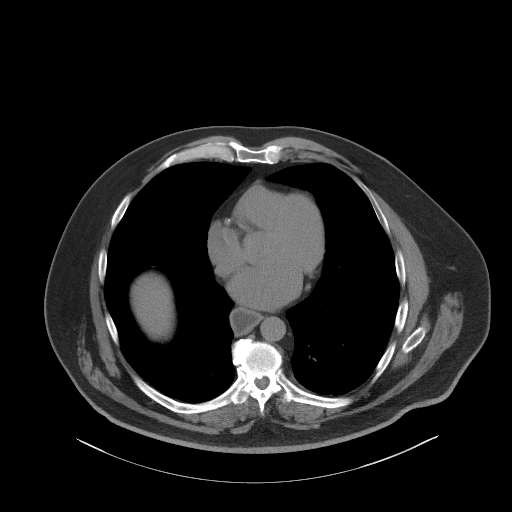

[Series 5: coronal st · coronal · 1.01mm/px · 3 of 104 slices shown]
[im 35/104  soft-tissue]
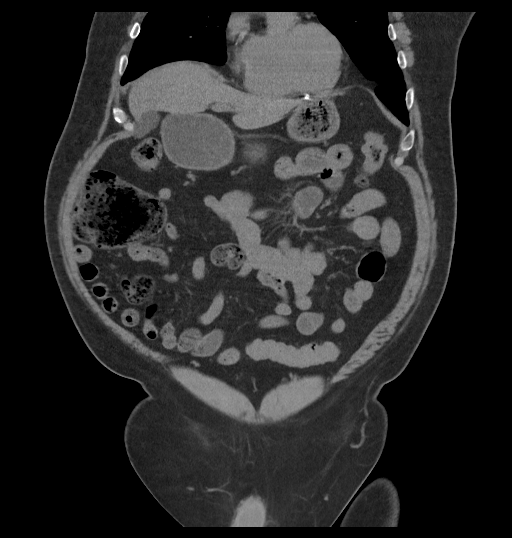
[im 46/104  soft-tissue]
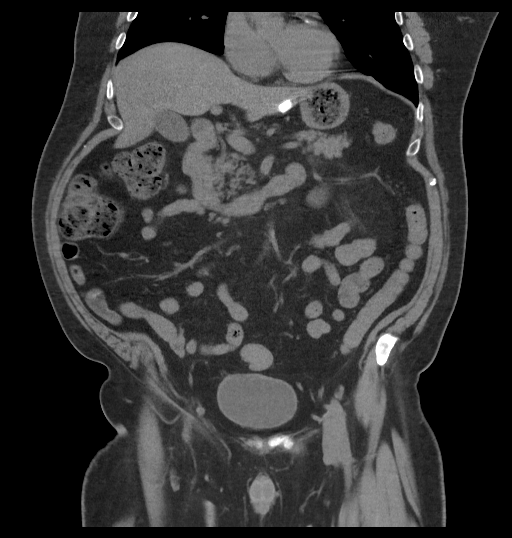
[im 58/104  soft-tissue]
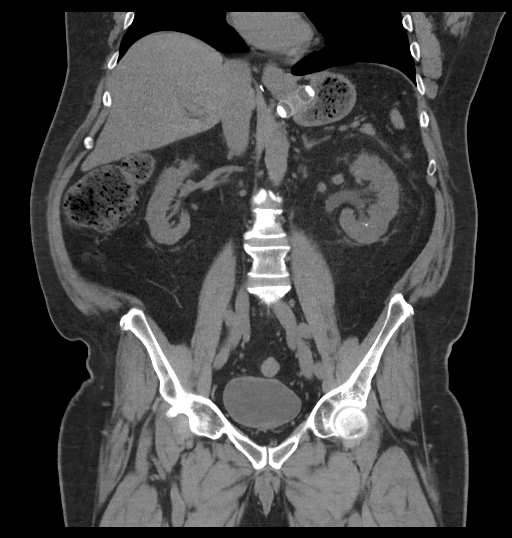

[17 of 46 positions shown; findings below may reference images not displayed]

FINDINGS: Lower chest: Small calcified granuloma in the left lower lobe. No
acute abnormality. Calcifications scattered in the visualized
coronary arteries. Heart is normal size. No effusions.

Hepatobiliary: No focal hepatic abnormality. Gallbladder
unremarkable.

Pancreas: No focal abnormality or ductal dilatation.

Spleen: No focal abnormality.  Normal size.

Adrenals/Urinary Tract: 4 mm proximal left ureteral stone with mild
left hydronephrosis and perinephric stranding. Punctate
nonobstructing stones in the lower poles of the kidneys bilaterally.
No adrenal mass. Urinary bladder unremarkable.

Stomach/Bowel: Gastric lap band device noted in place near the GE
junction. Moderate stool throughout the colon. Normal appendix. No
evidence of bowel obstruction.

Vascular/Lymphatic: No evidence of aneurysm or adenopathy.

Reproductive: No visible focal abnormality.

Other: No free fluid or free air.

Musculoskeletal: No acute bony abnormality.
IMPRESSION: 4 mm proximal left ureteral stone with mild left hydronephrosis and
perinephric stranding.

Punctate bilateral lower pole nephrolithiasis.

Prior gastric lap band.

Moderate stool in the colon.

No acute findings in the abdomen or pelvis.

## 2018-06-08 NOTE — Progress Notes (Deleted)
@Patient  ID: William Booth, male    DOB: 1950-07-30, 68 y.o.   MRN: [id number]  No chief complaint on file.   Referring provider: Hulan Fess, MD  HPI:   PMH:  Smoker/ Smoking History:  Maintenance:   Pt of: Dr. Annamaria Boots  04/14/2018- 68 year old male never smoker followed for OSA, history gastric banding 2952, complicated by HBP, GERD, DM 2, hypothyroid, bariatric lap band surgery 2007, HBP -----OSA; DME: Pt wears CPAP nihgtly and DL attached.   Recent [facility] Encounters:     06/08/2018  - Visit   HPI  Tests:    FENO:  No results found for: NITRICOXIDE  PFT: No flowsheet data found.  Imaging: No results found.    Specialty Problems      Pulmonary Problems   Obstructive sleep apnea    NPSG 03/29/98- AHI 77/hr (weight 295 lbs) severe obstructive sleep apnea with moderate oxygen desaturation and very loud snoring. CPAP 12/[facility]      Aspiration pneumonia of left lower lobe ([facility])   Sepsis due to pneumonia ([place])      Allergies  Allergen Reactions  . Acetaminophen Anaphylaxis  . Nsaids Hives and Rash    Face only will spread if not caught early.  Can take ASA    Immunization History  Administered Date(s) Administered  . Influenza Split 03/20/2011, 01/04/2012, 02/08/2013, 01/28/2014, 12/08/2014, 01/29/2016, 12/23/2017  . Influenza,inj,Quad PF,6-35 Mos 12/29/2016  . Pneumococcal Polysaccharide-23 04/12/2008  . Tdap 09/24/2013  . Zoster Recombinat (Shingrix) 12/29/2016    Past Medical History:  Diagnosis Date  . Anxiety   . Diabetes mellitus    type 2  . GERD (gastroesophageal reflux disease)   . Hypertension   . Hypothyroidism    pt states no thyroid problems  . Low HDL (under 40)   . Narcolepsy   . Peripheral neuropathy    not taking any meds at this time  . Sleep apnea    uses CPAP, last sleep study 1999, had titration 11/2011  . Type II diabetes mellitus with stage 2 chronic kidney disease ([place]) 03/19/2016  . Umbilical  hernia     Tobacco History: Social History   Tobacco Use  Smoking Status Never Smoker  Smokeless Tobacco Never Used   Counseling given: Not Answered   Outpatient Encounter Medications as of 06/09/2018  Medication Sig  . ALPRAZolam (XANAX) 0.5 MG tablet Take 0.5 mg by mouth 2 (two) times daily as needed for anxiety.   Marland Kitchen amitriptyline (ELAVIL) 100 MG tablet Take 100 mg by mouth at bedtime as needed for sleep.   Marland Kitchen amphetamine-dextroamphetamine (ADDERALL XR) 30 MG 24 hr capsule Take 1 capsule (30 mg total) by mouth daily.  Marland Kitchen atenolol (TENORMIN) 50 MG tablet Take 50 mg by mouth 2 (two) times daily.  Marland Kitchen buPROPion (WELLBUTRIN XL) 150 MG 24 hr tablet Take 2 tablets (300 mg total) by mouth daily.  Marland Kitchen escitalopram (LEXAPRO) 10 MG tablet Take 10 mg by mouth daily.  . fenofibrate 160 MG tablet Take 160 mg by mouth daily.  Marland Kitchen glimepiride (AMARYL) 4 MG tablet Take 2 mg by mouth 2 (two) times daily.   . metFORMIN (GLUCOPHAGE) 500 MG tablet Take 2 tablets (1,000 mg total) by mouth 2 (two) times daily.  Marland Kitchen omega-3 acid ethyl esters (LOVAZA) 1 G capsule Take 1 g by mouth daily.   Marland Kitchen omeprazole (PRILOSEC) 40 MG capsule Take 1 capsule by mouth daily.  . rosuvastatin (CRESTOR) 40 MG tablet Take 40 mg by mouth daily.  Marland Kitchen  TRULICITY 0.17 CB/4.4HQ SOPN Inject weekly  . vitamin B-12 (CYANOCOBALAMIN) 1000 MCG tablet Take 1,000 mcg by mouth daily.   No facility-administered encounter medications on file as of 06/09/2018.      Review of Systems  Review of Systems   Physical Exam  There were no vitals taken for this visit.  Wt Readings from Last 5 Encounters:  04/14/18 235 lb (106.6 kg)  06/07/17 236 lb 9.6 oz (107.3 kg)  05/27/17 242 lb 14.4 oz (110.2 kg)  05/24/17 235 lb (106.6 kg)  04/12/17 239 lb (108.4 kg)     Physical Exam    Lab Results:  CBC    Component Value Date/Time   WBC 13.8 (H) 05/24/2017 0923   RBC 4.65 05/24/2017 0923   HGB 12.2 (L) 06/07/2017 0951   HCT 36.0 (L) 06/07/2017  0951   PLT 198 05/24/2017 0923   MCV 89.7 05/24/2017 0923   MCH 30.1 05/24/2017 0923   MCHC 33.6 05/24/2017 0923   RDW 14.1 05/24/2017 0923   LYMPHSABS 1.3 05/24/2017 0923   MONOABS 1.1 (H) 05/24/2017 0923   EOSABS 0.0 05/24/2017 0923   BASOSABS 0.0 05/24/2017 0923    BMET    Component Value Date/Time   NA 144 06/07/2017 0951   K 3.7 06/07/2017 0951   CL 104 05/24/2017 0923   CO2 23 05/24/2017 0923   GLUCOSE 110 (H) 06/07/2017 0951   BUN 27 (H) 05/24/2017 0923   CREATININE 1.90 (H) 05/24/2017 0923   CALCIUM 9.3 05/24/2017 0923   GFRNONAA 36 (L) 05/24/2017 0923   GFRAA 41 (L) 05/24/2017 0923    BNP No results found for: BNP  ProBNP No results found for: PROBNP    Assessment & Plan:     No problem-specific Assessment & Plan notes found for this encounter.     Lauraine Rinne, NP 06/08/2018   This appointment was *** with over 50% of the time in direct face-to-face patient care, assessment, plan of care, and follow-up.

## 2018-06-09 ENCOUNTER — Ambulatory Visit (INDEPENDENT_AMBULATORY_CARE_PROVIDER_SITE_OTHER): Payer: 59 | Admitting: Nurse Practitioner

## 2018-06-09 ENCOUNTER — Encounter: Payer: Self-pay | Admitting: Nurse Practitioner

## 2018-06-09 ENCOUNTER — Ambulatory Visit: Payer: 59 | Admitting: Pulmonary Disease

## 2018-06-09 DIAGNOSIS — G4733 Obstructive sleep apnea (adult) (pediatric): Secondary | ICD-10-CM

## 2018-06-09 NOTE — Patient Instructions (Signed)
Patient continues to benefit from CPAP with good compliance and control documented Continue CPAP at current settings Continue current medications Goal of 4 hours or more usage per night Maintain healthy weight Do not drive if drowsy Follow up with [facility]  in 1 year or sooner if needed

## 2018-06-09 NOTE — Assessment & Plan Note (Signed)
Patient Instructions  Patient continues to benefit from CPAP with good compliance and control documented Continue CPAP at current settings Continue current medications Goal of 4 hours or more usage per night Maintain healthy weight Do not drive if drowsy Follow up with [facility]  in 1 year or sooner if needed

## 2018-06-09 NOTE — Progress Notes (Signed)
@Patient  ID: William Booth, male    DOB: 15-Sep-1950, 68 y.o.   MRN: [id number]  Chief Complaint  Patient presents with  . Follow-up    F/U for cpap use. States his cpap is great and he feels much more rested.     Referring provider: Hulan Fess, MD  HPI 68 year old male with OSA followed by Dr. Annamaria Boots. PMH: Gastric banding 2007, hypertension, GERD, DM2, and narcolepsy  Tests: NPSG 03/29/98- AHI 77/hr (weight 295 lbs) severe obstructive sleep apnea with moderate oxygen desaturation and very loud snoring.  CPAP Compliance report 05/11/18 - 06/09/18: Usage days 30/30 (100%), average usage 8 hours 33 minutes, CPAP Auto set 10 - 20 cmH20, AHI: 0.7  OV 06/09/18 - CPAP follow up Patient presents today for CPAP follow-up.  He has been on CPAP for years but recently got a new machine.  He states new machine and new nasal mask have been working well for him.  He has been compliant with CPAP and wears it daily.  He states that he benefits from the CPAP.  He feels much less drowsy during the day.  He denies any recent fever, chest pain, or edema.     Allergies  Allergen Reactions  . Acetaminophen Anaphylaxis  . Nsaids Hives and Rash    Face only will spread if not caught early.  Can take ASA    Immunization History  Administered Date(s) Administered  . Influenza Split 03/20/2011, 01/04/2012, 02/08/2013, 01/28/2014, 12/08/2014, 01/29/2016, 12/23/2017  . Influenza,inj,Quad PF,6-35 Mos 12/29/2016  . Pneumococcal Polysaccharide-23 04/12/2008  . Tdap 09/24/2013  . Zoster Recombinat (Shingrix) 12/29/2016    Past Medical History:  Diagnosis Date  . Anxiety   . Diabetes mellitus    type 2  . GERD (gastroesophageal reflux disease)   . Hypertension   . Hypothyroidism    pt states no thyroid problems  . Low HDL (under 40)   . Narcolepsy   . Peripheral neuropathy    not taking any meds at this time  . Sleep apnea    uses CPAP, last sleep study 1999, had titration 11/2011  . Type  II diabetes mellitus with stage 2 chronic kidney disease ([place]) 03/19/2016  . Umbilical hernia     Tobacco History: Social History   Tobacco Use  Smoking Status Never Smoker  Smokeless Tobacco Never Used   Counseling given: Not Answered   Outpatient Encounter Medications as of 06/09/2018  Medication Sig  . ALPRAZolam (XANAX) 0.5 MG tablet Take 0.5 mg by mouth 2 (two) times daily as needed for anxiety.   Marland Kitchen amitriptyline (ELAVIL) 100 MG tablet Take 100 mg by mouth at bedtime as needed for sleep.   Marland Kitchen amphetamine-dextroamphetamine (ADDERALL XR) 30 MG 24 hr capsule Take 1 capsule (30 mg total) by mouth daily.  Marland Kitchen atenolol (TENORMIN) 50 MG tablet Take 50 mg by mouth 2 (two) times daily.  Marland Kitchen buPROPion (WELLBUTRIN XL) 150 MG 24 hr tablet Take 2 tablets (300 mg total) by mouth daily.  Marland Kitchen escitalopram (LEXAPRO) 10 MG tablet Take 10 mg by mouth daily.  . fenofibrate 160 MG tablet Take 160 mg by mouth daily.  Marland Kitchen glimepiride (AMARYL) 4 MG tablet Take 2 mg by mouth 2 (two) times daily.   . metFORMIN (GLUCOPHAGE) 500 MG tablet Take 2 tablets (1,000 mg total) by mouth 2 (two) times daily.  Marland Kitchen omega-3 acid ethyl esters (LOVAZA) 1 G capsule Take 1 g by mouth daily.   Marland Kitchen omeprazole (PRILOSEC) 40 MG capsule Take 1  capsule by mouth daily.  . rosuvastatin (CRESTOR) 40 MG tablet Take 40 mg by mouth daily.  . TRULICITY 6.38 VF/6.4PP SOPN Inject weekly  . vitamin B-12 (CYANOCOBALAMIN) 1000 MCG tablet Take 1,000 mcg by mouth daily.   No facility-administered encounter medications on file as of 06/09/2018.      Review of Systems  Review of Systems  Constitutional: Negative.  Negative for chills and fever.  HENT: Negative.   Respiratory: Negative for cough, shortness of breath and wheezing.   Cardiovascular: Negative.  Negative for chest pain, palpitations and leg swelling.  Gastrointestinal: Negative.   Allergic/Immunologic: Negative.   Neurological: Negative.   Psychiatric/Behavioral: Negative.         Physical Exam  BP 118/82   Pulse 85   Ht 5\' 10"  (1.778 m)   Wt 223 lb 3.2 oz (101.2 kg)   SpO2 98%   BMI 32.03 kg/m   Wt Readings from Last 5 Encounters:  06/09/18 223 lb 3.2 oz (101.2 kg)  04/14/18 235 lb (106.6 kg)  06/07/17 236 lb 9.6 oz (107.3 kg)  05/27/17 242 lb 14.4 oz (110.2 kg)  05/24/17 235 lb (106.6 kg)     Physical Exam Vitals signs and nursing note reviewed.  Constitutional:      General: He is not in acute distress.    Appearance: He is well-developed.  Cardiovascular:     Rate and Rhythm: Normal rate and regular rhythm.  Pulmonary:     Effort: Pulmonary effort is normal. No respiratory distress.     Breath sounds: Normal breath sounds. No wheezing or rhonchi.  Musculoskeletal:        General: No swelling.  Skin:    General: Skin is warm and dry.  Neurological:     Mental Status: He is alert and oriented to person, place, and time.       Assessment & Plan:   Obstructive sleep apnea Patient Instructions  Patient continues to benefit from CPAP with good compliance and control documented Continue CPAP at current settings Continue current medications Goal of 4 hours or more usage per night Maintain healthy weight Do not drive if drowsy Follow up with [facility]  in 1 year or sooner if needed        Fenton Foy, NP 06/09/2018

## 2018-06-18 DIAGNOSIS — E782 Mixed hyperlipidemia: Secondary | ICD-10-CM | POA: Diagnosis not present

## 2018-06-18 DIAGNOSIS — I10 Essential (primary) hypertension: Secondary | ICD-10-CM | POA: Diagnosis not present

## 2018-06-18 DIAGNOSIS — N183 Chronic kidney disease, stage 3 (moderate): Secondary | ICD-10-CM | POA: Diagnosis not present

## 2018-06-18 DIAGNOSIS — E785 Hyperlipidemia, unspecified: Secondary | ICD-10-CM | POA: Diagnosis not present

## 2018-06-18 DIAGNOSIS — E1140 Type 2 diabetes mellitus with diabetic neuropathy, unspecified: Secondary | ICD-10-CM | POA: Diagnosis not present

## 2018-06-18 DIAGNOSIS — E781 Pure hyperglyceridemia: Secondary | ICD-10-CM | POA: Diagnosis not present

## 2018-06-18 DIAGNOSIS — E1122 Type 2 diabetes mellitus with diabetic chronic kidney disease: Secondary | ICD-10-CM | POA: Diagnosis not present

## 2018-06-20 NOTE — Assessment & Plan Note (Signed)
He continues to benefit from CPAP and download confirms good compliance and control.  Adderall has been successful without concerns, diversion or misuse, to help with residual daytime tiredness. Plan-replace old CPAP machine, changing to auto 10-20. Call for Adderall refill when needed

## 2018-06-20 NOTE — Assessment & Plan Note (Signed)
Despite remote bariatric surgery, he remains significantly overweight.  Meaningful weight loss efforts encouraged.

## 2018-06-24 DIAGNOSIS — H35033 Hypertensive retinopathy, bilateral: Secondary | ICD-10-CM | POA: Diagnosis not present

## 2018-06-24 DIAGNOSIS — H3562 Retinal hemorrhage, left eye: Secondary | ICD-10-CM | POA: Diagnosis not present

## 2018-06-24 DIAGNOSIS — H2513 Age-related nuclear cataract, bilateral: Secondary | ICD-10-CM | POA: Diagnosis not present

## 2018-06-24 DIAGNOSIS — E119 Type 2 diabetes mellitus without complications: Secondary | ICD-10-CM | POA: Diagnosis not present

## 2018-06-26 MED FILL — TRULICITY 0.75 MG/0.5 ML PE: 0.75 | 84 days supply | Qty: 6 | Fill #1

## 2018-06-28 DIAGNOSIS — G4733 Obstructive sleep apnea (adult) (pediatric): Secondary | ICD-10-CM | POA: Diagnosis not present

## 2018-07-03 DIAGNOSIS — R05 Cough: Secondary | ICD-10-CM | POA: Diagnosis not present

## 2018-07-03 DIAGNOSIS — J029 Acute pharyngitis, unspecified: Secondary | ICD-10-CM | POA: Diagnosis not present

## 2018-07-03 MED FILL — AZITHROMYCIN 250 MG TABLET: 250 | 5 days supply | Qty: 6 | Fill #0

## 2018-07-23 ENCOUNTER — Encounter: Payer: Self-pay | Admitting: *Deleted

## 2018-07-27 DIAGNOSIS — G4733 Obstructive sleep apnea (adult) (pediatric): Secondary | ICD-10-CM | POA: Diagnosis not present

## 2018-07-29 DIAGNOSIS — G4733 Obstructive sleep apnea (adult) (pediatric): Secondary | ICD-10-CM | POA: Diagnosis not present

## 2018-08-27 DIAGNOSIS — E1140 Type 2 diabetes mellitus with diabetic neuropathy, unspecified: Secondary | ICD-10-CM | POA: Diagnosis not present

## 2018-08-27 DIAGNOSIS — F419 Anxiety disorder, unspecified: Secondary | ICD-10-CM | POA: Diagnosis not present

## 2018-08-27 DIAGNOSIS — J069 Acute upper respiratory infection, unspecified: Secondary | ICD-10-CM | POA: Diagnosis not present

## 2018-08-27 MED FILL — JARDIANCE 10 MG TABLET: 10 | 90 days supply | Qty: 90 | Fill #0

## 2018-08-28 DIAGNOSIS — G4733 Obstructive sleep apnea (adult) (pediatric): Secondary | ICD-10-CM | POA: Diagnosis not present

## 2018-08-29 MED FILL — EZETIMIBE 10 MG TABS: 10 | 90 days supply | Qty: 90 | Fill #0

## 2018-08-29 MED FILL — ATENOLOL 50 MG TABLET: 50 | 90 days supply | Qty: 180 | Fill #0

## 2018-08-29 MED FILL — FENOFIBRATE 160 MG TABLET: 160 | 90 days supply | Qty: 90 | Fill #0

## 2018-08-29 MED FILL — metFORMIN HCL 500 MG TABS: 500 | 90 days supply | Qty: 360 | Fill #0

## 2018-08-29 MED FILL — ROSUVASTATIN CALCIUM 40 MG: 40 | 90 days supply | Qty: 90 | Fill #0

## 2018-08-29 MED FILL — ALPRAZolam 0.5 MG TABS: 0.5 | 90 days supply | Qty: 180 | Fill #0

## 2018-08-29 MED FILL — FLUTICASONE PROP 50 MCG SPR: 50 | 60 days supply | Qty: 16 | Fill #0

## 2018-08-29 MED FILL — OMEGA-3 ETHYL ESTERS 1 GM C: 1 | 90 days supply | Qty: 180 | Fill #0

## 2018-08-29 MED FILL — AMITRIPTYLINE HCL 100 MG TA: 100 | 90 days supply | Qty: 90 | Fill #0

## 2018-08-29 MED FILL — OMEPRAZOLE 40 MG CPDR: 40 | 90 days supply | Qty: 90 | Fill #0

## 2018-08-30 MED FILL — buPROPion HCL ER (XL) 300 M: 300 | 90 days supply | Qty: 90 | Fill #0

## 2018-08-30 MED FILL — AZITHROMYCIN 250 MG TABS: 250 | 5 days supply | Qty: 6 | Fill #1

## 2018-08-30 MED FILL — TRULICITY 0.75 MG/0.5 ML PE: 0.75 | 84 days supply | Qty: 6 | Fill #0

## 2018-09-02 DIAGNOSIS — Z86010 Personal history of colonic polyps: Secondary | ICD-10-CM | POA: Diagnosis not present

## 2018-09-02 DIAGNOSIS — I10 Essential (primary) hypertension: Secondary | ICD-10-CM | POA: Diagnosis not present

## 2018-09-02 DIAGNOSIS — G629 Polyneuropathy, unspecified: Secondary | ICD-10-CM | POA: Diagnosis not present

## 2018-09-02 DIAGNOSIS — G4730 Sleep apnea, unspecified: Secondary | ICD-10-CM | POA: Diagnosis not present

## 2018-09-02 DIAGNOSIS — E1122 Type 2 diabetes mellitus with diabetic chronic kidney disease: Secondary | ICD-10-CM | POA: Diagnosis not present

## 2018-09-02 DIAGNOSIS — R05 Cough: Secondary | ICD-10-CM | POA: Diagnosis not present

## 2018-09-02 DIAGNOSIS — E782 Mixed hyperlipidemia: Secondary | ICD-10-CM | POA: Diagnosis not present

## 2018-09-02 DIAGNOSIS — N183 Chronic kidney disease, stage 3 (moderate): Secondary | ICD-10-CM | POA: Diagnosis not present

## 2018-09-02 DIAGNOSIS — J029 Acute pharyngitis, unspecified: Secondary | ICD-10-CM | POA: Diagnosis not present

## 2018-09-28 DIAGNOSIS — G4733 Obstructive sleep apnea (adult) (pediatric): Secondary | ICD-10-CM | POA: Diagnosis not present

## 2018-10-27 DIAGNOSIS — G4733 Obstructive sleep apnea (adult) (pediatric): Secondary | ICD-10-CM | POA: Diagnosis not present

## 2018-10-28 DIAGNOSIS — G4733 Obstructive sleep apnea (adult) (pediatric): Secondary | ICD-10-CM | POA: Diagnosis not present

## 2018-11-28 DIAGNOSIS — G4733 Obstructive sleep apnea (adult) (pediatric): Secondary | ICD-10-CM | POA: Diagnosis not present

## 2018-12-13 ENCOUNTER — Other Ambulatory Visit: Payer: Self-pay | Admitting: Internal Medicine

## 2018-12-13 MED FILL — ROSUVASTATIN CALCIUM 40 MG: 40 | 90 days supply | Qty: 90 | Fill #1

## 2018-12-13 MED FILL — OMEPRAZOLE 40 MG CPDR: 40 | 90 days supply | Qty: 90 | Fill #1

## 2018-12-13 MED FILL — EZETIMIBE 10 MG TABS: 10 | 90 days supply | Qty: 90 | Fill #1

## 2018-12-13 MED FILL — TRULICITY 0.75 MG/0.5 ML PE: 0.75 | 84 days supply | Qty: 6 | Fill #1

## 2018-12-13 MED FILL — FENOFIBRATE 160 MG TABLET: 160 | 90 days supply | Qty: 90 | Fill #1

## 2018-12-13 MED FILL — AMITRIPTYLINE HCL 100 MG TA: 100 | 90 days supply | Qty: 90 | Fill #1

## 2018-12-13 MED FILL — ATENOLOL 50 MG TABLET: 50 | 90 days supply | Qty: 180 | Fill #1

## 2018-12-13 MED FILL — buPROPion HCL ER (XL) 300 M: 300 | 90 days supply | Qty: 90 | Fill #1

## 2018-12-13 MED FILL — OMEGA-3 ETHYL ESTERS 1 GM C: 1 | 90 days supply | Qty: 180 | Fill #1

## 2018-12-13 MED FILL — JARDIANCE 10 MG TABLET: 10 | 90 days supply | Qty: 90 | Fill #1

## 2018-12-13 MED FILL — metFORMIN HCL 500 MG TABS: 500 | 90 days supply | Qty: 360 | Fill #1

## 2018-12-15 MED FILL — ALPRAZolam 0.5 MG TABS: 0.5 | 90 days supply | Qty: 180 | Fill #0

## 2018-12-16 MED FILL — ADDERALL XR 30 MG CAP SA: 30 | 90 days supply | Qty: 90 | Fill #0

## 2018-12-16 NOTE — Telephone Encounter (Signed)
Is this appropriate for refill ? 

## 2018-12-16 NOTE — Telephone Encounter (Signed)
Adderall refill e-sent 

## 2018-12-17 ENCOUNTER — Encounter: Payer: Self-pay | Admitting: *Deleted

## 2018-12-29 DIAGNOSIS — G4733 Obstructive sleep apnea (adult) (pediatric): Secondary | ICD-10-CM | POA: Diagnosis not present

## 2019-01-09 DIAGNOSIS — Z13220 Encounter for screening for lipoid disorders: Secondary | ICD-10-CM | POA: Diagnosis not present

## 2019-01-09 DIAGNOSIS — Z125 Encounter for screening for malignant neoplasm of prostate: Secondary | ICD-10-CM | POA: Diagnosis not present

## 2019-01-09 DIAGNOSIS — E1140 Type 2 diabetes mellitus with diabetic neuropathy, unspecified: Secondary | ICD-10-CM | POA: Diagnosis not present

## 2019-01-09 DIAGNOSIS — Z0000 Encounter for general adult medical examination without abnormal findings: Secondary | ICD-10-CM | POA: Diagnosis not present

## 2019-01-15 DIAGNOSIS — E1122 Type 2 diabetes mellitus with diabetic chronic kidney disease: Secondary | ICD-10-CM | POA: Diagnosis not present

## 2019-01-15 DIAGNOSIS — Z0000 Encounter for general adult medical examination without abnormal findings: Secondary | ICD-10-CM | POA: Diagnosis not present

## 2019-01-15 DIAGNOSIS — N183 Chronic kidney disease, stage 3 (moderate): Secondary | ICD-10-CM | POA: Diagnosis not present

## 2019-01-15 DIAGNOSIS — G629 Polyneuropathy, unspecified: Secondary | ICD-10-CM | POA: Diagnosis not present

## 2019-01-15 DIAGNOSIS — E782 Mixed hyperlipidemia: Secondary | ICD-10-CM | POA: Diagnosis not present

## 2019-01-15 DIAGNOSIS — G4730 Sleep apnea, unspecified: Secondary | ICD-10-CM | POA: Diagnosis not present

## 2019-01-15 DIAGNOSIS — Z86010 Personal history of colonic polyps: Secondary | ICD-10-CM | POA: Diagnosis not present

## 2019-01-15 DIAGNOSIS — Z23 Encounter for immunization: Secondary | ICD-10-CM | POA: Diagnosis not present

## 2019-01-15 DIAGNOSIS — I10 Essential (primary) hypertension: Secondary | ICD-10-CM | POA: Diagnosis not present

## 2019-01-15 MED FILL — ALPRAZolam 0.5 MG TABS: 0.5 | 90 days supply | Qty: 180 | Fill #0

## 2019-01-16 MED FILL — VASCEPA 1 GM CAPSULE: 1 | 30 days supply | Qty: 120 | Fill #0

## 2019-01-28 DIAGNOSIS — G4733 Obstructive sleep apnea (adult) (pediatric): Secondary | ICD-10-CM | POA: Diagnosis not present

## 2019-02-28 DIAGNOSIS — G4733 Obstructive sleep apnea (adult) (pediatric): Secondary | ICD-10-CM | POA: Diagnosis not present

## 2019-03-13 DIAGNOSIS — G4733 Obstructive sleep apnea (adult) (pediatric): Secondary | ICD-10-CM | POA: Diagnosis not present

## 2019-03-16 MED FILL — VASCEPA 1 GM CAPSULE: 1 | 30 days supply | Qty: 120 | Fill #1

## 2019-04-04 MED FILL — metFORMIN HCL 500 MG TABS: 500 | 90 days supply | Qty: 360 | Fill #2

## 2019-04-04 MED FILL — ROSUVASTATIN CALCIUM 40 MG: 40 | 90 days supply | Qty: 90 | Fill #2

## 2019-04-04 MED FILL — FENOFIBRATE 160 MG TABLET: 160 | 90 days supply | Qty: 90 | Fill #2

## 2019-04-04 MED FILL — EZETIMIBE 10 MG TABS: 10 | 90 days supply | Qty: 90 | Fill #2

## 2019-04-04 MED FILL — OMEPRAZOLE 40 MG CPDR: 40 | 90 days supply | Qty: 90 | Fill #2

## 2019-04-04 MED FILL — VASCEPA 1 GM CAPSULE: 1 | 30 days supply | Qty: 120 | Fill #1

## 2019-04-04 MED FILL — TRULICITY 0.75 MG/0.5 ML PE: 0.75 | 84 days supply | Qty: 6 | Fill #2

## 2019-04-04 MED FILL — JARDIANCE 10 MG TABLET: 10 | 90 days supply | Qty: 90 | Fill #2

## 2019-04-04 MED FILL — BUPROPION HCL XL 300 MG TAB: 300 | 90 days supply | Qty: 90 | Fill #2

## 2019-04-04 MED FILL — ATENOLOL 50 MG TABLET: 50 | 90 days supply | Qty: 180 | Fill #2

## 2019-04-04 MED FILL — AMITRIPTYLINE HCL 100 MG TA: 100 | 90 days supply | Qty: 90 | Fill #2

## 2019-04-07 ENCOUNTER — Other Ambulatory Visit: Payer: Self-pay | Admitting: Internal Medicine

## 2019-04-07 NOTE — Telephone Encounter (Signed)
Dr. Annamaria Boots, please advise if you are okay to refill med for pt.  Allergies  Allergen Reactions  . Acetaminophen Anaphylaxis  . Nsaids Hives and Rash    Face only will spread if not caught early.  Can take ASA     Current Outpatient Medications:  .  ADDERALL XR 30 MG 24 hr capsule, TAKE 1 CAPSULE BY MOUTH ONCE DAILY, Disp: 90 capsule, Rfl: 0 .  ALPRAZolam (XANAX) 0.5 MG tablet, Take 0.5 mg by mouth 2 (two) times daily as needed for anxiety. , Disp: , Rfl:  .  amitriptyline (ELAVIL) 100 MG tablet, Take 100 mg by mouth at bedtime as needed for sleep. , Disp: , Rfl:  .  atenolol (TENORMIN) 50 MG tablet, Take 50 mg by mouth 2 (two) times daily., Disp: , Rfl:  .  buPROPion (WELLBUTRIN XL) 150 MG 24 hr tablet, Take 2 tablets (300 mg total) by mouth daily., Disp: , Rfl:  .  escitalopram (LEXAPRO) 10 MG tablet, Take 10 mg by mouth daily., Disp: , Rfl: 3 .  fenofibrate 160 MG tablet, Take 160 mg by mouth daily., Disp: , Rfl:  .  glimepiride (AMARYL) 4 MG tablet, Take 2 mg by mouth 2 (two) times daily. , Disp: , Rfl:  .  metFORMIN (GLUCOPHAGE) 500 MG tablet, Take 2 tablets (1,000 mg total) by mouth 2 (two) times daily., Disp: , Rfl: 0 .  omega-3 acid ethyl esters (LOVAZA) 1 G capsule, Take 1 g by mouth daily. , Disp: , Rfl:  .  omeprazole (PRILOSEC) 40 MG capsule, Take 1 capsule by mouth daily., Disp: , Rfl:  .  rosuvastatin (CRESTOR) 40 MG tablet, Take 40 mg by mouth daily., Disp: , Rfl: 1 .  TRULICITY [id number] SOPN, Inject weekly, Disp: , Rfl:  .  vitamin B-12 (CYANOCOBALAMIN) 1000 MCG tablet, Take 1,000 mcg by mouth daily., Disp: , Rfl:

## 2019-04-08 MED FILL — ADDERALL XR 30 MG CAP SA: 30 | 90 days supply | Qty: 90 | Fill #0

## 2019-04-08 NOTE — Telephone Encounter (Signed)
Adderall refill e-sent 

## 2019-04-15 ENCOUNTER — Encounter: Payer: Self-pay | Admitting: Internal Medicine

## 2019-04-16 ENCOUNTER — Encounter: Payer: Self-pay | Admitting: Internal Medicine

## 2019-04-16 ENCOUNTER — Ambulatory Visit (INDEPENDENT_AMBULATORY_CARE_PROVIDER_SITE_OTHER): Payer: 59 | Admitting: Internal Medicine

## 2019-04-16 ENCOUNTER — Other Ambulatory Visit: Payer: Self-pay

## 2019-04-16 DIAGNOSIS — G4733 Obstructive sleep apnea (adult) (pediatric): Secondary | ICD-10-CM

## 2019-04-16 NOTE — Progress Notes (Signed)
HPI  male never smoker followed for OSA, complicated by HBP, GERD,  DM 2, hypothyroid, bariatric lap band surgery 2007, kidney stones   -----------------------------------------------------  04/14/2018- 68 year old male never smoker followed for OSA, history gastric banding [id number], complicated by HBP, GERD, DM 2, hypothyroid, bariatric lap band surgery 2007, HBP -----OSA; DME: Pt wears CPAP nihgtly and DL attached. Body weight today 235 pounds CPAP 12/[facility]>> today replace old machine, change to auto 10-20 Download 90% compliance AHI 0.7/hour. Does well with CPAP but old machine needs replacing. Continues to use Adderall occasionally for residual daytime fatigue but understands to take naps.  04/16/2019- Virtual Visit via Telephone Note  I connected with Jannifer Franklin on 04/16/19 at  2:00 PM EST by telephone and verified that I am speaking with the correct person using two identifiers.  Location: Patient: H Provider: O   I discussed the limitations, risks, security and privacy concerns of performing an evaluation and management service by telephone and the availability of in person appointments. I also discussed with the patient that there may be a patient responsible charge related to this service. The patient expressed understanding and agreed to proceed.   History of Present Illness: 68 year old male never smoker followed for OSA,  complicated by HBP, GERD, DM 2, hypothyroid, bariatric lap band surgery 2007, HBP CPAP auto 10-20/ [facility] -----1 year f/u OSA  Adderall XR 30 0-1 daily- helps Happy with his CPAP- "can't sleep without it". Feels sleep issues well-controlled Denies other medical concerns. Had flu vax  Observations/Objective: Download compliance 93%, AHI 0.7/ hr  Assessment and Plan:  OSA- bnefits from CPAP   Plan continue 10-20, Adderall  Follow Up Instructions: 1 year   I discussed the assessment and treatment plan with the patient. The patient was provided  an opportunity to ask questions and all were answered. The patient agreed with the plan and demonstrated an understanding of the instructions.   The patient was advised to call back or seek an in-person evaluation if the symptoms worsen or if the condition fails to improve as anticipated.  I provided 18 minutes of non-face-to-face time during this encounter.   Baird Lyons, MD      ROS-see HPI + = positive Constitutional:    weight loss, No-night sweats, fevers, chills, fatigue, lassitude. HEENT:   No-  headaches, difficulty swallowing, tooth/dental problems, sore throat,       No-  sneezing, itching, ear ache, nasal congestion, post nasal drip,  CV:  No-   chest pain, orthopnea, PND, swelling in lower extremities, anasarca,  dizziness, palpitations Resp: No-   shortness of breath with exertion or at rest.              No-   productive cough,  No non-productive cough,  No- coughing up of blood.              No-   change in color of mucus.  No- wheezing.   Skin: No-   rash or lesions. GI:  No-   heartburn, indigestion, abdominal pain, nausea, vomiting,  GU: MS:  No-   joint pain or swelling.   Neuro-     nothing unusual Psych:  No- change in mood or affect. No depression or anxiety.  No memory loss.  OBJ- Physical Exam General- Alert, Oriented, Affect-appropriate, Distress- none acute, +overweight Skin- rash-none, lesions- none, excoriation- none Lymphadenopathy- none Head- atraumatic            Eyes- Gross vision intact, PERRLA, conjunctivae and  secretions clear            Ears- Hearing, canals-normal            Nose- Clear, no-Septal dev, mucus, polyps, erosion, perforation             Throat- Mallampati III , mucosa clear , drainage- none, tonsils- atrophic Neck- flexible , trachea midline, no stridor , thyroid nl, carotid no bruit Chest - symmetrical excursion , unlabored           Heart/CV- RRR , no murmur , no gallop  , no rub, nl s1 s2                           - JVD-  none , edema- none, stasis changes- none, varices- none           Lung- clear to P&A, wheeze- none, cough- none , dullness-none, rub- none           Chest wall-  Abd-  Br/ Gen/ Rectal- Not done, not indicated Extrem- cyanosis- none, clubbing, none, atrophy- none, strength- nl Neuro- grossly intact to observation

## 2019-04-16 NOTE — Assessment & Plan Note (Signed)
Benefits from CPAP Plan- continue auto 10-20, Adderall XR 30

## 2019-04-16 NOTE — Assessment & Plan Note (Signed)
Weight loss documented in EMR over past 2 years- encouraging.

## 2019-04-16 NOTE — Patient Instructions (Signed)
We can continue CPAP auto 10-20, mask of choice, humidifier, supplies, AirView/ card  Please call if we can help

## 2019-04-30 MED FILL — ALPRAZolam 0.5 MG TABS: 0.5 | 90 days supply | Qty: 180 | Fill #0

## 2019-05-04 MED FILL — VASCEPA 1 GM CAPSULE: 1 | 30 days supply | Qty: 120 | Fill #2

## 2019-06-26 DIAGNOSIS — H3562 Retinal hemorrhage, left eye: Secondary | ICD-10-CM | POA: Diagnosis not present

## 2019-06-26 DIAGNOSIS — E119 Type 2 diabetes mellitus without complications: Secondary | ICD-10-CM | POA: Diagnosis not present

## 2019-06-26 DIAGNOSIS — H2513 Age-related nuclear cataract, bilateral: Secondary | ICD-10-CM | POA: Diagnosis not present

## 2019-06-26 DIAGNOSIS — H35033 Hypertensive retinopathy, bilateral: Secondary | ICD-10-CM | POA: Diagnosis not present

## 2019-07-02 DIAGNOSIS — E1140 Type 2 diabetes mellitus with diabetic neuropathy, unspecified: Secondary | ICD-10-CM | POA: Diagnosis not present

## 2019-07-02 DIAGNOSIS — H8110 Benign paroxysmal vertigo, unspecified ear: Secondary | ICD-10-CM | POA: Diagnosis not present

## 2019-07-02 DIAGNOSIS — Z7984 Long term (current) use of oral hypoglycemic drugs: Secondary | ICD-10-CM | POA: Diagnosis not present

## 2019-07-02 DIAGNOSIS — I10 Essential (primary) hypertension: Secondary | ICD-10-CM | POA: Diagnosis not present

## 2019-07-16 ENCOUNTER — Other Ambulatory Visit (HOSPITAL_COMMUNITY): Payer: Self-pay | Admitting: Family Medicine

## 2019-07-16 DIAGNOSIS — F419 Anxiety disorder, unspecified: Secondary | ICD-10-CM | POA: Diagnosis not present

## 2019-07-16 MED FILL — buPROPion HCL ER (XL) 300 M: 300 | 90 days supply | Qty: 90 | Fill #0

## 2019-07-16 MED FILL — OMEPRAZOLE 40 MG CPDR: 40 | 90 days supply | Qty: 90 | Fill #0

## 2019-07-16 MED FILL — FENOFIBRATE 160 MG TABLET: 160 | 90 days supply | Qty: 90 | Fill #0

## 2019-07-16 MED FILL — EZETIMIBE 10 MG TABS: 10 | 90 days supply | Qty: 90 | Fill #0

## 2019-07-16 MED FILL — AMITRIPTYLINE HCL 100 MG TA: 100 | 90 days supply | Qty: 90 | Fill #0

## 2019-07-16 MED FILL — ATENOLOL 50 MG TABLET: 50 | 90 days supply | Qty: 90 | Fill #0

## 2019-07-16 MED FILL — ROSUVASTATIN CALCIUM 40 MG: 40 | 90 days supply | Qty: 90 | Fill #0

## 2019-07-16 MED FILL — METFORMIN HCL 500 MG TABS: 500 | 90 days supply | Qty: 360 | Fill #0

## 2019-07-16 MED FILL — JARDIANCE 10 MG TABLET: 10 | 90 days supply | Qty: 90 | Fill #0

## 2019-07-17 MED FILL — VASCEPA 1 GM CAPSULE: 1 | 30 days supply | Qty: 120 | Fill #0

## 2019-07-17 MED FILL — TRULICITY 0.75 MG/0.5 ML PE: 0.75 | 84 days supply | Qty: 6 | Fill #0

## 2019-07-20 MED FILL — FLUTICASONE PROP 50 MCG SPR: 50 | 60 days supply | Qty: 16 | Fill #1

## 2019-09-03 DIAGNOSIS — G4733 Obstructive sleep apnea (adult) (pediatric): Secondary | ICD-10-CM | POA: Diagnosis not present

## 2019-10-15 ENCOUNTER — Ambulatory Visit: Payer: Medicare HMO | Admitting: Internal Medicine

## 2019-11-05 ENCOUNTER — Other Ambulatory Visit: Payer: Self-pay | Admitting: Internal Medicine

## 2019-11-05 DIAGNOSIS — S39012A Strain of muscle, fascia and tendon of lower back, initial encounter: Secondary | ICD-10-CM | POA: Diagnosis not present

## 2019-11-05 DIAGNOSIS — M545 Low back pain: Secondary | ICD-10-CM | POA: Diagnosis not present

## 2019-11-05 MED FILL — ROSUVASTATIN CALCIUM 40 MG: 40 | 90 days supply | Qty: 90 | Fill #1

## 2019-11-05 MED FILL — JARDIANCE 10 MG TABLET: 10 | 90 days supply | Qty: 90 | Fill #1

## 2019-11-05 MED FILL — VASCEPA 1 GM CAPSULE: 1 | 30 days supply | Qty: 120 | Fill #1

## 2019-11-05 MED FILL — FENOFIBRATE 160 MG TABLET: 160 | 90 days supply | Qty: 90 | Fill #1

## 2019-11-05 MED FILL — METHYLPREDNISOLONE 4 MG TBP: 4 | 6 days supply | Qty: 21 | Fill #0

## 2019-11-05 MED FILL — ATENOLOL 50 MG TABLET: 50 | 90 days supply | Qty: 90 | Fill #1

## 2019-11-05 MED FILL — OMEPRAZOLE 40 MG CPDR: 40 | 90 days supply | Qty: 90 | Fill #1

## 2019-11-05 MED FILL — EZETIMIBE 10 MG TABS: 10 | 90 days supply | Qty: 90 | Fill #1

## 2019-11-05 MED FILL — METFORMIN HCL 500 MG TABS: 500 | 90 days supply | Qty: 360 | Fill #1

## 2019-11-05 MED FILL — AMITRIPTYLINE HCL 100 MG TA: 100 | 90 days supply | Qty: 90 | Fill #1

## 2019-11-05 MED FILL — TRULICITY 0.75 MG/0.5 ML PE: 0.75 | 84 days supply | Qty: 6 | Fill #1

## 2019-11-05 NOTE — Telephone Encounter (Signed)
Adderall refill e-sent to [facility]

## 2019-11-06 MED FILL — ADDERALL XR 30 MG CAP SA: 30 | 90 days supply | Qty: 90 | Fill #0

## 2020-01-19 DIAGNOSIS — G4733 Obstructive sleep apnea (adult) (pediatric): Secondary | ICD-10-CM | POA: Diagnosis not present

## 2020-01-22 DIAGNOSIS — Z23 Encounter for immunization: Secondary | ICD-10-CM | POA: Diagnosis not present

## 2020-01-22 DIAGNOSIS — Z125 Encounter for screening for malignant neoplasm of prostate: Secondary | ICD-10-CM | POA: Diagnosis not present

## 2020-01-22 DIAGNOSIS — I10 Essential (primary) hypertension: Secondary | ICD-10-CM | POA: Diagnosis not present

## 2020-01-22 DIAGNOSIS — N183 Chronic kidney disease, stage 3 unspecified: Secondary | ICD-10-CM | POA: Diagnosis not present

## 2020-01-22 DIAGNOSIS — E782 Mixed hyperlipidemia: Secondary | ICD-10-CM | POA: Diagnosis not present

## 2020-01-22 DIAGNOSIS — G4730 Sleep apnea, unspecified: Secondary | ICD-10-CM | POA: Diagnosis not present

## 2020-01-22 DIAGNOSIS — K219 Gastro-esophageal reflux disease without esophagitis: Secondary | ICD-10-CM | POA: Diagnosis not present

## 2020-01-22 DIAGNOSIS — E1122 Type 2 diabetes mellitus with diabetic chronic kidney disease: Secondary | ICD-10-CM | POA: Diagnosis not present

## 2020-01-22 DIAGNOSIS — Z0000 Encounter for general adult medical examination without abnormal findings: Secondary | ICD-10-CM | POA: Diagnosis not present

## 2020-01-22 DIAGNOSIS — F419 Anxiety disorder, unspecified: Secondary | ICD-10-CM | POA: Diagnosis not present

## 2020-01-22 DIAGNOSIS — G629 Polyneuropathy, unspecified: Secondary | ICD-10-CM | POA: Diagnosis not present

## 2020-01-22 MED FILL — ALPRAZolam 0.5 MG TABS: 0.5 | 90 days supply | Qty: 180 | Fill #0

## 2020-02-22 DIAGNOSIS — G4733 Obstructive sleep apnea (adult) (pediatric): Secondary | ICD-10-CM | POA: Diagnosis not present

## 2020-03-21 DIAGNOSIS — M545 Low back pain, unspecified: Secondary | ICD-10-CM | POA: Diagnosis not present

## 2020-03-21 DIAGNOSIS — M546 Pain in thoracic spine: Secondary | ICD-10-CM | POA: Diagnosis not present

## 2020-03-21 DIAGNOSIS — M5459 Other low back pain: Secondary | ICD-10-CM | POA: Diagnosis not present

## 2020-03-30 NOTE — Progress Notes (Signed)
HPI  male never smoker followed for OSA, complicated by HBP, GERD,  DM 2, hypothyroid, bariatric lap band surgery 2007, kidney stones   -----------------------------------------------------  04/16/2019- Virtual Visit via Telephone Note History of Present Illness: 69 year old male never smoker followed for OSA,  complicated by HBP, GERD, DM 2, hypothyroid, bariatric lap band surgery 2007, HBP CPAP auto 10-20/ [facility] -----1 year f/u OSA  Adderall XR 30 0-1 daily- helps Happy with his CPAP- "can't sleep without it". Feels sleep issues well-controlled Denies other medical concerns. Had flu vax  Observations/Objective: Download compliance 93%, AHI 0.7/ hr  Assessment and Plan:  OSA- bnefits from CPAP   Plan continue 10-20, Adderall  Follow Up Instructions: 1 year    03/31/20- 69 year old male never smoker followed for OSA,  complicated by HBP, GERD, DM 2, hypothyroid, bariatric lap band surgery 2007, HBP,  CPAP auto 10-20/ [facility]> >today auto 5-15 Download- compliance 97%, AHI 0.4/hr Body weight today- 174 lbs  Down from 260 about 4 yrs ago. Covid vax-3 Moderna, Flu vax- had -----1 year follow up.  DL printed.  pt is doing well on cpap Adderall XR 30, Alprazolam 0.5,  Has dieted weight down to 171 lbs after Lap Band Despite weight loss CPAP is working well with minimal leak and he is happy with it.   ROS-see HPI + = positive Constitutional:    + diet weight loss, No-night sweats, fevers, chills, fatigue, lassitude. HEENT:   No-  headaches, difficulty swallowing, tooth/dental problems, sore throat,       No-  sneezing, itching, ear ache, nasal congestion, post nasal drip,  CV:  No-   chest pain, orthopnea, PND, swelling in lower extremities, anasarca,  dizziness, palpitations Resp: No-   shortness of brea th with exertion or at rest.              No-   productive cough,  No non-productive cough,  No- coughing up of blood.              No-   change in color of mucus.  No- wheezing.    Skin: No-   rash or lesions. GI:  No-   heartburn, indigestion, abdominal pain, nausea, vomiting,  GU: MS:  No-   joint pain or swelling.   Neuro-     nothing unusual Psych:  No- change in mood or affect. No depression or anxiety.  No memory loss.  OBJ- Physical Exam General- Alert, Oriented, Affect-appropriate, Distress- none acute, Skin- rash-none, lesions- none, excoriation- none Lymphadenopathy- none Head- atraumatic            Eyes- Gross vision intact, PERRLA, conjunctivae and secretions clear            Ears- Hearing, canals-normal            Nose- Clear, no-Septal dev, mucus, polyps, erosion, perforation             Throat- Mallampati III , mucosa clear , drainage- none, tonsils- atrophic Neck- flexible , trachea midline, no stridor , thyroid nl, carotid no bruit Chest - symmetrical excursion , unlabored           Heart/CV- RRR , no murmur , no gallop  , no rub, nl s1 s2                           - JVD- none , edema- none, stasis changes- none, varices- none  Lung- clear to P&A, wheeze- none, cough- none , dullness-none, rub- none           Chest wall-  Abd-  Br/ Gen/ Rectal- Not done, not indicated Extrem- cyanosis- none, clubbing, none, atrophy- none, strength- nl Neuro- grossly intact to observation

## 2020-03-31 ENCOUNTER — Ambulatory Visit (INDEPENDENT_AMBULATORY_CARE_PROVIDER_SITE_OTHER): Payer: 59 | Admitting: Internal Medicine

## 2020-03-31 ENCOUNTER — Other Ambulatory Visit: Payer: Self-pay | Admitting: Internal Medicine

## 2020-03-31 ENCOUNTER — Encounter: Payer: Self-pay | Admitting: Internal Medicine

## 2020-03-31 ENCOUNTER — Other Ambulatory Visit: Payer: Self-pay

## 2020-03-31 VITALS — BP 120/80 | HR 72 | Temp 97.3°F | Wt 171.2 lb

## 2020-03-31 DIAGNOSIS — G4733 Obstructive sleep apnea (adult) (pediatric): Secondary | ICD-10-CM

## 2020-03-31 MED ORDER — AMPHETAMINE-DEXTROAMPHET ER 30 MG PO CP24: 30.0000 mg | ORAL_CAPSULE | Freq: Every day | ORAL | 0 refills | Status: DC

## 2020-03-31 MED FILL — ADDERALL XR 30 MG CAP SA: 30 | 90 days supply | Qty: 90 | Fill #0

## 2020-03-31 NOTE — Patient Instructions (Addendum)
Order- DME [facility]-   Please change autopap range to 5-15, continue mask of choice, humidifier, supplies, Airview/ card  Congratulations on your weight loss !!  Please call if we can help

## 2020-04-05 MED FILL — ROSUVASTATIN CALCIUM 40 MG: 40 | 90 days supply | Qty: 90 | Fill #2

## 2020-04-05 MED FILL — ATENOLOL 50 MG TABLET: 50 | 90 days supply | Qty: 90 | Fill #2

## 2020-04-05 MED FILL — AMITRIPTYLINE HCL 100 MG TA: 100 | 90 days supply | Qty: 90 | Fill #2

## 2020-04-05 MED FILL — OMEPRAZOLE 40 MG CPDR: 40 | 90 days supply | Qty: 90 | Fill #2

## 2020-04-05 MED FILL — JARDIANCE 10 MG TABLET: 10 | 90 days supply | Qty: 90 | Fill #2

## 2020-04-05 MED FILL — TRULICITY 0.75 MG/0.5 ML PE: 0.75 | 84 days supply | Qty: 6 | Fill #2

## 2020-04-05 MED FILL — FENOFIBRATE 160 MG TABLET: 160 | 90 days supply | Qty: 90 | Fill #2

## 2020-04-05 MED FILL — METFORMIN HCL 500 MG TABS: 500 | 90 days supply | Qty: 360 | Fill #2

## 2020-04-05 MED FILL — EZETIMIBE 10 MG TABS: 10 | 90 days supply | Qty: 90 | Fill #2

## 2020-04-05 MED FILL — VASCEPA 1 GM CAPSULE: 1 | 30 days supply | Qty: 120 | Fill #2

## 2020-04-07 ENCOUNTER — Other Ambulatory Visit (HOSPITAL_COMMUNITY): Payer: Self-pay | Admitting: Family Medicine

## 2020-04-07 MED FILL — ALPRAZolam 0.5 MG TABS: 0.5 | 90 days supply | Qty: 180 | Fill #0

## 2020-04-18 ENCOUNTER — Ambulatory Visit: Payer: Medicare HMO | Admitting: Internal Medicine

## 2020-04-18 DIAGNOSIS — M546 Pain in thoracic spine: Secondary | ICD-10-CM | POA: Diagnosis not present

## 2020-04-25 DIAGNOSIS — M546 Pain in thoracic spine: Secondary | ICD-10-CM | POA: Diagnosis not present

## 2020-04-25 DIAGNOSIS — M5459 Other low back pain: Secondary | ICD-10-CM | POA: Diagnosis not present

## 2020-05-09 DIAGNOSIS — M545 Low back pain, unspecified: Secondary | ICD-10-CM | POA: Diagnosis not present

## 2020-05-10 NOTE — Assessment & Plan Note (Signed)
Benefits from CPAP. Still comfortable with pressure 12 after weight loss. Plan- change to auto 5-15 so machine will adjust to weight appropriately

## 2020-05-10 NOTE — Assessment & Plan Note (Signed)
Successful weight loss. Hoping to hold around current range

## 2020-05-17 DIAGNOSIS — M5459 Other low back pain: Secondary | ICD-10-CM | POA: Diagnosis not present

## 2020-05-27 DIAGNOSIS — M5459 Other low back pain: Secondary | ICD-10-CM | POA: Diagnosis not present

## 2020-06-24 DIAGNOSIS — M5136 Other intervertebral disc degeneration, lumbar region: Secondary | ICD-10-CM | POA: Diagnosis not present

## 2020-06-24 DIAGNOSIS — M47816 Spondylosis without myelopathy or radiculopathy, lumbar region: Secondary | ICD-10-CM | POA: Diagnosis not present

## 2020-06-28 DIAGNOSIS — E119 Type 2 diabetes mellitus without complications: Secondary | ICD-10-CM | POA: Diagnosis not present

## 2020-06-28 DIAGNOSIS — H35033 Hypertensive retinopathy, bilateral: Secondary | ICD-10-CM | POA: Diagnosis not present

## 2020-06-28 DIAGNOSIS — H2513 Age-related nuclear cataract, bilateral: Secondary | ICD-10-CM | POA: Diagnosis not present

## 2020-06-28 DIAGNOSIS — H3562 Retinal hemorrhage, left eye: Secondary | ICD-10-CM | POA: Diagnosis not present

## 2020-07-01 ENCOUNTER — Other Ambulatory Visit (HOSPITAL_COMMUNITY): Payer: Self-pay | Admitting: Physical Medicine and Rehabilitation

## 2020-07-01 MED FILL — DIAZEPAM 10 MG TABS: 10 | 1 days supply | Qty: 2 | Fill #0

## 2020-07-05 DIAGNOSIS — M545 Low back pain, unspecified: Secondary | ICD-10-CM | POA: Diagnosis not present

## 2020-07-05 MED FILL — VASCEPA 1 GM CAPSULE: 1 | 30 days supply | Qty: 120 | Fill #3

## 2020-07-05 MED FILL — JARDIANCE 10 MG TABLET: 10 | 90 days supply | Qty: 90 | Fill #3

## 2020-07-05 MED FILL — AMITRIPTYLINE HCL 100 MG TA: 100 | 90 days supply | Qty: 90 | Fill #3

## 2020-07-05 MED FILL — ATENOLOL 50 MG TABLET: 50 | 90 days supply | Qty: 90 | Fill #3

## 2020-07-05 MED FILL — TRULICITY 0.75 MG/0.5 ML PE: 0.75 | 84 days supply | Qty: 6 | Fill #3

## 2020-07-05 MED FILL — ROSUVASTATIN CALCIUM 40 MG: 40 | 90 days supply | Qty: 90 | Fill #3

## 2020-07-05 MED FILL — OMEPRAZOLE 40 MG CPDR: 40 | 90 days supply | Qty: 90 | Fill #3

## 2020-07-05 MED FILL — EZETIMIBE 10 MG TABS: 10 | 90 days supply | Qty: 90 | Fill #3

## 2020-07-05 MED FILL — FENOFIBRATE 160 MG TABLET: 160 | 90 days supply | Qty: 90 | Fill #3

## 2020-07-05 MED FILL — METFORMIN HCL 500 MG TABS: 500 | 90 days supply | Qty: 360 | Fill #3

## 2020-07-07 DIAGNOSIS — M545 Low back pain, unspecified: Secondary | ICD-10-CM | POA: Diagnosis not present

## 2020-07-11 DIAGNOSIS — M545 Low back pain, unspecified: Secondary | ICD-10-CM | POA: Diagnosis not present

## 2020-07-12 DIAGNOSIS — G4733 Obstructive sleep apnea (adult) (pediatric): Secondary | ICD-10-CM | POA: Diagnosis not present

## 2020-07-13 ENCOUNTER — Other Ambulatory Visit (HOSPITAL_COMMUNITY): Payer: Self-pay | Admitting: Family Medicine

## 2020-07-14 DIAGNOSIS — M545 Low back pain, unspecified: Secondary | ICD-10-CM | POA: Diagnosis not present

## 2020-07-18 DIAGNOSIS — I10 Essential (primary) hypertension: Secondary | ICD-10-CM | POA: Diagnosis not present

## 2020-07-18 DIAGNOSIS — N183 Chronic kidney disease, stage 3 unspecified: Secondary | ICD-10-CM | POA: Diagnosis not present

## 2020-07-18 DIAGNOSIS — F419 Anxiety disorder, unspecified: Secondary | ICD-10-CM | POA: Diagnosis not present

## 2020-07-18 DIAGNOSIS — Z794 Long term (current) use of insulin: Secondary | ICD-10-CM | POA: Diagnosis not present

## 2020-07-18 DIAGNOSIS — E782 Mixed hyperlipidemia: Secondary | ICD-10-CM | POA: Diagnosis not present

## 2020-07-18 DIAGNOSIS — E1140 Type 2 diabetes mellitus with diabetic neuropathy, unspecified: Secondary | ICD-10-CM | POA: Diagnosis not present

## 2020-07-18 DIAGNOSIS — M545 Low back pain, unspecified: Secondary | ICD-10-CM | POA: Diagnosis not present

## 2020-07-21 DIAGNOSIS — M47896 Other spondylosis, lumbar region: Secondary | ICD-10-CM | POA: Diagnosis not present

## 2020-07-21 DIAGNOSIS — M47816 Spondylosis without myelopathy or radiculopathy, lumbar region: Secondary | ICD-10-CM | POA: Diagnosis not present

## 2020-07-25 DIAGNOSIS — M545 Low back pain, unspecified: Secondary | ICD-10-CM | POA: Diagnosis not present

## 2020-07-28 DIAGNOSIS — M545 Low back pain, unspecified: Secondary | ICD-10-CM | POA: Diagnosis not present

## 2020-08-02 ENCOUNTER — Telehealth: Payer: Self-pay | Admitting: Internal Medicine

## 2020-08-02 MED ORDER — AMPHETAMINE-DEXTROAMPHET ER 30 MG PO CP24
30.0000 mg | ORAL_CAPSULE | Freq: Every day | ORAL | 0 refills | Status: DC
  Filled 2020-08-02: qty 90, 90d supply, fill #0

## 2020-08-02 NOTE — Telephone Encounter (Signed)
Patient requesting refill on Adderall to be sent into [facility]. Patient is requesting 90 day supply. Last OV 03/31/20. Medication is pended in chart.  Sent to Dr. Annamaria Boots, ok to send?

## 2020-08-02 NOTE — Telephone Encounter (Signed)
Adderall refilled

## 2020-08-03 ENCOUNTER — Other Ambulatory Visit (HOSPITAL_COMMUNITY): Payer: Self-pay

## 2020-08-03 NOTE — Telephone Encounter (Signed)
Spoke with the pt and notified that rx was sent  Nothing further needed 

## 2020-08-12 DIAGNOSIS — M47816 Spondylosis without myelopathy or radiculopathy, lumbar region: Secondary | ICD-10-CM | POA: Diagnosis not present

## 2020-09-06 ENCOUNTER — Other Ambulatory Visit (HOSPITAL_COMMUNITY): Payer: Self-pay

## 2020-09-06 MED ORDER — DIAZEPAM 10 MG PO TABS
ORAL_TABLET | ORAL | 0 refills | Status: DC
  Filled 2020-09-06: qty 2, 1d supply, fill #0

## 2020-09-20 ENCOUNTER — Other Ambulatory Visit (HOSPITAL_COMMUNITY): Payer: Self-pay

## 2020-09-20 MED ORDER — JARDIANCE 10 MG PO TABS
ORAL_TABLET | ORAL | 0 refills | Status: DC
  Filled 2020-09-20: qty 60, 60d supply, fill #0

## 2020-09-20 MED ORDER — AMITRIPTYLINE HCL 100 MG PO TABS
100.0000 mg | ORAL_TABLET | Freq: Every evening | ORAL | 3 refills | Status: DC | PRN
  Filled 2020-09-20: qty 90, 90d supply, fill #0
  Filled 2020-12-31: qty 90, 90d supply, fill #1
  Filled 2021-04-03: qty 90, 90d supply, fill #2
  Filled 2021-07-14: qty 90, 90d supply, fill #3

## 2020-09-20 MED ORDER — METFORMIN HCL 500 MG PO TABS
ORAL_TABLET | ORAL | 0 refills | Status: DC
  Filled 2020-09-20: qty 180, 90d supply, fill #0

## 2020-09-20 MED ORDER — EZETIMIBE 10 MG PO TABS
10.0000 mg | ORAL_TABLET | Freq: Every day | ORAL | 0 refills | Status: DC
  Filled 2020-09-20: qty 90, 90d supply, fill #0

## 2020-09-20 MED ORDER — FENOFIBRATE 160 MG PO TABS
ORAL_TABLET | ORAL | 0 refills | Status: DC
  Filled 2020-09-20: qty 90, 90d supply, fill #0

## 2020-09-20 MED ORDER — TRULICITY 0.75 MG/0.5ML ~~LOC~~ SOAJ
SUBCUTANEOUS | 3 refills | Status: DC
  Filled 2020-09-20: qty 6, 84d supply, fill #0
  Filled 2020-12-31: qty 6, 84d supply, fill #1
  Filled 2021-04-03: qty 6, 84d supply, fill #2
  Filled 2021-07-14: qty 6, 84d supply, fill #3

## 2020-09-20 MED ORDER — ICOSAPENT ETHYL 1 G PO CAPS
ORAL_CAPSULE | ORAL | 0 refills | Status: DC
  Filled 2020-09-20: qty 120, 30d supply, fill #0

## 2020-09-20 MED ORDER — ROSUVASTATIN CALCIUM 40 MG PO TABS
ORAL_TABLET | ORAL | 0 refills | Status: DC
  Filled 2020-09-20: qty 90, 90d supply, fill #0

## 2020-09-20 MED ORDER — ATENOLOL 25 MG PO TABS
ORAL_TABLET | ORAL | 3 refills | Status: DC
  Filled 2020-09-20: qty 90, 90d supply, fill #0
  Filled 2020-12-31: qty 90, 90d supply, fill #1
  Filled 2021-04-03: qty 90, 90d supply, fill #2
  Filled 2021-07-14: qty 90, 90d supply, fill #3

## 2020-09-22 ENCOUNTER — Other Ambulatory Visit (HOSPITAL_COMMUNITY): Payer: Self-pay

## 2020-09-22 DIAGNOSIS — M47816 Spondylosis without myelopathy or radiculopathy, lumbar region: Secondary | ICD-10-CM | POA: Diagnosis not present

## 2020-09-22 MED ORDER — OXYCODONE HCL 10 MG PO TABS
ORAL_TABLET | ORAL | 0 refills | Status: DC
  Filled 2020-09-22: qty 40, 10d supply, fill #0

## 2020-09-23 ENCOUNTER — Other Ambulatory Visit (HOSPITAL_COMMUNITY): Payer: Self-pay

## 2020-09-27 ENCOUNTER — Other Ambulatory Visit (HOSPITAL_COMMUNITY): Payer: Self-pay

## 2020-09-27 MED ORDER — OMEPRAZOLE 40 MG PO CPDR
DELAYED_RELEASE_CAPSULE | ORAL | 3 refills | Status: DC
  Filled 2020-09-27 – 2020-10-17 (×2): qty 90, 90d supply, fill #0
  Filled 2020-12-31: qty 90, 90d supply, fill #1
  Filled 2021-07-14: qty 90, 90d supply, fill #2

## 2020-09-28 ENCOUNTER — Other Ambulatory Visit (HOSPITAL_COMMUNITY): Payer: Self-pay

## 2020-10-05 ENCOUNTER — Other Ambulatory Visit (HOSPITAL_COMMUNITY): Payer: Self-pay

## 2020-10-06 DIAGNOSIS — G4733 Obstructive sleep apnea (adult) (pediatric): Secondary | ICD-10-CM | POA: Diagnosis not present

## 2020-10-17 ENCOUNTER — Other Ambulatory Visit (HOSPITAL_COMMUNITY): Payer: Self-pay

## 2020-10-17 MED ORDER — CLINDAMYCIN HCL 150 MG PO CAPS
ORAL_CAPSULE | ORAL | 0 refills | Status: DC
  Filled 2020-10-17: qty 25, 6d supply, fill #0

## 2020-10-18 ENCOUNTER — Other Ambulatory Visit (HOSPITAL_COMMUNITY): Payer: Self-pay

## 2020-10-18 MED ORDER — ALPRAZOLAM 0.5 MG PO TABS
ORAL_TABLET | ORAL | 0 refills | Status: DC
  Filled 2020-10-18: qty 60, 30d supply, fill #0

## 2020-10-21 ENCOUNTER — Other Ambulatory Visit (HOSPITAL_COMMUNITY): Payer: Self-pay

## 2020-10-29 ENCOUNTER — Other Ambulatory Visit (HOSPITAL_COMMUNITY): Payer: Self-pay

## 2020-10-29 DIAGNOSIS — R0781 Pleurodynia: Secondary | ICD-10-CM | POA: Diagnosis not present

## 2020-10-29 DIAGNOSIS — M47816 Spondylosis without myelopathy or radiculopathy, lumbar region: Secondary | ICD-10-CM | POA: Diagnosis not present

## 2020-10-29 MED ORDER — OXYCODONE HCL 10 MG PO TABS
ORAL_TABLET | ORAL | 0 refills | Status: DC
  Filled 2020-10-29: qty 60, 30d supply, fill #0

## 2020-11-22 ENCOUNTER — Other Ambulatory Visit (HOSPITAL_COMMUNITY): Payer: Self-pay

## 2020-11-22 MED ORDER — AMOXICILLIN 500 MG PO CAPS
ORAL_CAPSULE | ORAL | 0 refills | Status: DC
  Filled 2020-11-22: qty 21, 7d supply, fill #0

## 2020-11-25 ENCOUNTER — Telehealth: Payer: Self-pay | Admitting: Internal Medicine

## 2020-11-25 ENCOUNTER — Other Ambulatory Visit (HOSPITAL_COMMUNITY): Payer: Self-pay

## 2020-11-25 MED ORDER — AMPHETAMINE-DEXTROAMPHET ER 30 MG PO CP24
30.0000 mg | ORAL_CAPSULE | Freq: Every day | ORAL | 0 refills | Status: DC
  Filled 2020-11-25: qty 90, 90d supply, fill #0

## 2020-11-25 NOTE — Telephone Encounter (Signed)
Adderall refilled

## 2020-11-25 NOTE — Telephone Encounter (Signed)
Call made to patient, confirmed DOB, medication and pharmacy.   Requested:Adderall XR '30mg'$  (90 day supply)  Last Filled: 08/02/20  Last OV:03/31/20  CY please advise. Medication pended.  Thanks :)

## 2020-11-25 NOTE — Telephone Encounter (Signed)
Called and spoke with patient to let him know that his refill was sent into pharmacy. He expressed understanding. Nothing further needed at this time.

## 2020-12-30 ENCOUNTER — Other Ambulatory Visit (HOSPITAL_COMMUNITY): Payer: Self-pay

## 2020-12-30 DIAGNOSIS — M47816 Spondylosis without myelopathy or radiculopathy, lumbar region: Secondary | ICD-10-CM | POA: Diagnosis not present

## 2020-12-30 DIAGNOSIS — M47896 Other spondylosis, lumbar region: Secondary | ICD-10-CM | POA: Diagnosis not present

## 2020-12-30 MED ORDER — OXYCODONE HCL 10 MG PO TABS
ORAL_TABLET | ORAL | 0 refills | Status: DC
  Filled 2020-12-30: qty 60, 30d supply, fill #0

## 2020-12-31 ENCOUNTER — Other Ambulatory Visit (HOSPITAL_COMMUNITY): Payer: Self-pay

## 2021-01-03 ENCOUNTER — Other Ambulatory Visit (HOSPITAL_COMMUNITY): Payer: Self-pay

## 2021-01-03 MED ORDER — JARDIANCE 10 MG PO TABS
10.0000 mg | ORAL_TABLET | Freq: Every day | ORAL | 0 refills | Status: DC
  Filled 2021-01-03: qty 90, 90d supply, fill #0

## 2021-01-03 MED ORDER — EZETIMIBE 10 MG PO TABS
ORAL_TABLET | ORAL | 0 refills | Status: DC
  Filled 2021-01-03: qty 90, 90d supply, fill #0

## 2021-01-03 MED ORDER — ROSUVASTATIN CALCIUM 40 MG PO TABS
40.0000 mg | ORAL_TABLET | Freq: Every day | ORAL | 0 refills | Status: DC
  Filled 2021-01-03: qty 90, 90d supply, fill #0

## 2021-01-03 MED ORDER — FENOFIBRATE 160 MG PO TABS
160.0000 mg | ORAL_TABLET | Freq: Every day | ORAL | 0 refills | Status: DC
  Filled 2021-01-03: qty 90, 90d supply, fill #0

## 2021-01-03 MED ORDER — METFORMIN HCL 500 MG PO TABS
ORAL_TABLET | ORAL | 0 refills | Status: DC
  Filled 2021-01-03: qty 180, 90d supply, fill #0

## 2021-01-03 MED ORDER — ICOSAPENT ETHYL 1 G PO CAPS
ORAL_CAPSULE | ORAL | 0 refills | Status: DC
  Filled 2021-01-03: qty 120, 30d supply, fill #0

## 2021-01-04 ENCOUNTER — Other Ambulatory Visit (HOSPITAL_COMMUNITY): Payer: Self-pay

## 2021-01-04 MED ORDER — ALPRAZOLAM 0.5 MG PO TABS
ORAL_TABLET | ORAL | 0 refills | Status: DC
  Filled 2021-01-04: qty 60, 30d supply, fill #0

## 2021-01-17 ENCOUNTER — Other Ambulatory Visit (HOSPITAL_COMMUNITY): Payer: Self-pay

## 2021-01-17 DIAGNOSIS — M5136 Other intervertebral disc degeneration, lumbar region: Secondary | ICD-10-CM | POA: Diagnosis not present

## 2021-01-17 DIAGNOSIS — M47816 Spondylosis without myelopathy or radiculopathy, lumbar region: Secondary | ICD-10-CM | POA: Diagnosis not present

## 2021-01-17 MED ORDER — OXYCODONE HCL 10 MG PO TABS
ORAL_TABLET | ORAL | 0 refills | Status: DC
  Filled 2021-01-17 – 2021-01-27 (×3): qty 60, 30d supply, fill #0

## 2021-01-19 ENCOUNTER — Ambulatory Visit: Payer: Medicare Other | Attending: Internal Medicine

## 2021-01-19 DIAGNOSIS — Z23 Encounter for immunization: Secondary | ICD-10-CM

## 2021-01-19 NOTE — Progress Notes (Signed)
   Covid-19 Vaccination Clinic  Name:  William Booth    MRN: [id number] DOB: [birth year]  01/19/2021  William Booth was observed post Covid-19 immunization for 15 minutes without incident. He was provided with Vaccine Information Sheet and instruction to access the V-Safe system.   William Booth was instructed to call [phone] with any severe reactions post vaccine: Difficulty breathing  Swelling of face and throat  A fast heartbeat  A bad rash all over body  Dizziness and weakness

## 2021-01-24 DIAGNOSIS — Z1211 Encounter for screening for malignant neoplasm of colon: Secondary | ICD-10-CM | POA: Diagnosis not present

## 2021-01-24 DIAGNOSIS — I10 Essential (primary) hypertension: Secondary | ICD-10-CM | POA: Diagnosis not present

## 2021-01-24 DIAGNOSIS — Z125 Encounter for screening for malignant neoplasm of prostate: Secondary | ICD-10-CM | POA: Diagnosis not present

## 2021-01-24 DIAGNOSIS — K219 Gastro-esophageal reflux disease without esophagitis: Secondary | ICD-10-CM | POA: Diagnosis not present

## 2021-01-24 DIAGNOSIS — E1140 Type 2 diabetes mellitus with diabetic neuropathy, unspecified: Secondary | ICD-10-CM | POA: Diagnosis not present

## 2021-01-24 DIAGNOSIS — Z0000 Encounter for general adult medical examination without abnormal findings: Secondary | ICD-10-CM | POA: Diagnosis not present

## 2021-01-24 DIAGNOSIS — Z23 Encounter for immunization: Secondary | ICD-10-CM | POA: Diagnosis not present

## 2021-01-24 DIAGNOSIS — N183 Chronic kidney disease, stage 3 unspecified: Secondary | ICD-10-CM | POA: Diagnosis not present

## 2021-01-24 DIAGNOSIS — E782 Mixed hyperlipidemia: Secondary | ICD-10-CM | POA: Diagnosis not present

## 2021-01-24 DIAGNOSIS — F419 Anxiety disorder, unspecified: Secondary | ICD-10-CM | POA: Diagnosis not present

## 2021-01-25 ENCOUNTER — Other Ambulatory Visit (HOSPITAL_COMMUNITY): Payer: Self-pay

## 2021-01-25 ENCOUNTER — Other Ambulatory Visit (HOSPITAL_BASED_OUTPATIENT_CLINIC_OR_DEPARTMENT_OTHER): Payer: Self-pay

## 2021-01-25 MED ORDER — COVID-19MRNA BIVAL VACC PFIZER 30 MCG/0.3ML IM SUSP
INTRAMUSCULAR | 0 refills | Status: DC
  Filled 2021-01-25: qty 0.3, 1d supply, fill #0

## 2021-01-27 ENCOUNTER — Other Ambulatory Visit (HOSPITAL_COMMUNITY): Payer: Self-pay

## 2021-03-14 ENCOUNTER — Other Ambulatory Visit (HOSPITAL_COMMUNITY): Payer: Self-pay

## 2021-03-14 DIAGNOSIS — M47816 Spondylosis without myelopathy or radiculopathy, lumbar region: Secondary | ICD-10-CM | POA: Diagnosis not present

## 2021-03-14 DIAGNOSIS — M5136 Other intervertebral disc degeneration, lumbar region: Secondary | ICD-10-CM | POA: Diagnosis not present

## 2021-03-14 MED ORDER — OXYCODONE HCL 10 MG PO TABS
ORAL_TABLET | ORAL | 0 refills | Status: DC
  Filled 2021-03-14: qty 60, 30d supply, fill #0

## 2021-03-16 ENCOUNTER — Other Ambulatory Visit (HOSPITAL_COMMUNITY): Payer: Self-pay

## 2021-03-29 ENCOUNTER — Encounter: Payer: Self-pay | Admitting: Internal Medicine

## 2021-03-30 NOTE — Progress Notes (Signed)
HPI  male never smoker followed for OSA, complicated by HBP, GERD,  DM 2, hypothyroid, bariatric lap band surgery 2007, kidney stones   -----------------------------------------------------   03/31/20- 70 year old male never smoker followed for OSA,  complicated by HBP, GERD, DM 2, hypothyroid, bariatric lap band surgery 2007, HBP,  CPAP auto 10-20/ [facility]> >today auto 5-15 Download- compliance 97%, AHI 0.4/hr Body weight today- 174 lbs  Down from 260 about 4 yrs ago. Covid vax-3 Moderna, Flu vax- had -----1 year follow up.  DL printed.  pt is doing well on cpap Adderall XR 30, Alprazolam 0.5,  Has dieted weight down to 171 lbs after Lap Band Despite weight loss CPAP is working well with minimal leak and he is happy with it.   03/31/2021- 70 year old male never smoker followed for OSA,  complicated by HBP, GERD, DM 2, hypothyroid, Bariatric lap band surgery 2007, HBP, lumbar DDD,  -alprazolam 0.5 mg bid, amitriptyline 100, adderall XR 30, CPAP  auto 5-15/ [facility] Download-compliance 76%, AHI 0.8/hr Body weight today-188 lbs Covid vax-4 Moderna, 1 Phizer Flu vax-had -----Patient states that he wants to talk about aderall and see if he can switch to something else. Otherwise he's doing good. He feels Adderall XR 30 is too strong.  He is open to trying a dose reduction but feels he still needs something to help with daytime sleepiness.  He continues to feel he is doing well with CPAP.  We looked at compliance noting missed and short nights.  Improving this may help. He had successful ablation procedure for lumbar spine pain. Note that weight has begun creeping up.  ROS-see HPI + = positive Constitutional:    + diet weight loss, No-night sweats, fevers, chills, fatigue, lassitude. HEENT:   No-  headaches, difficulty swallowing, tooth/dental problems, sore throat,       No-  sneezing, itching, ear ache, nasal congestion, post nasal drip,  CV:  No-   chest pain, orthopnea, PND, swelling in  lower extremities, anasarca,  dizziness, palpitations Resp: No-   shortness of brea th with exertion or at rest.              No-   productive cough,  No non-productive cough,  No- coughing up of blood.              No-   change in color of mucus.  No- wheezing.   Skin: No-   rash or lesions. GI:  No-   heartburn, indigestion, abdominal pain, nausea, vomiting,  GU: MS:  No-   joint pain or swelling.   Neuro-     nothing unusual Psych:  No- change in mood or affect. No depression or anxiety.  No memory loss.  OBJ- Physical Exam General- Alert, Oriented, Affect-appropriate, Distress- none acute, Skin- rash-none, lesions- none, excoriation- none Lymphadenopathy- none Head- atraumatic            Eyes- Gross vision intact, PERRLA, conjunctivae and secretions clear            Ears- Hearing, canals-normal            Nose- Clear, no-Septal dev, mucus, polyps, erosion, perforation             Throat- Mallampati III , mucosa clear , drainage- none, tonsils- atrophic Neck- flexible , trachea midline, no stridor , thyroid nl, carotid no bruit Chest - symmetrical excursion , unlabored           Heart/CV- RRR , no murmur , no gallop  ,  no rub, nl s1 s2                           - JVD- none , edema- none, stasis changes- none, varices- none           Lung- clear to P&A, wheeze- none, cough- none , dullness-none, rub- none           Chest wall-  Abd-  Br/ Gen/ Rectal- Not done, not indicated Extrem- cyanosis- none, clubbing, none, atrophy- none, strength- nl Neuro- grossly intact to observation

## 2021-03-31 ENCOUNTER — Other Ambulatory Visit (HOSPITAL_COMMUNITY): Payer: Self-pay

## 2021-03-31 ENCOUNTER — Ambulatory Visit (INDEPENDENT_AMBULATORY_CARE_PROVIDER_SITE_OTHER): Payer: 59 | Admitting: Internal Medicine

## 2021-03-31 ENCOUNTER — Other Ambulatory Visit: Payer: Self-pay

## 2021-03-31 ENCOUNTER — Encounter: Payer: Self-pay | Admitting: Internal Medicine

## 2021-03-31 DIAGNOSIS — G4710 Hypersomnia, unspecified: Secondary | ICD-10-CM | POA: Diagnosis not present

## 2021-03-31 DIAGNOSIS — G4733 Obstructive sleep apnea (adult) (pediatric): Secondary | ICD-10-CM

## 2021-03-31 MED ORDER — AMPHETAMINE-DEXTROAMPHET ER 20 MG PO CP24
20.0000 mg | ORAL_CAPSULE | Freq: Every day | ORAL | 0 refills | Status: DC
  Filled 2021-03-31: qty 30, 30d supply, fill #0

## 2021-03-31 NOTE — Patient Instructions (Signed)
We can continue CPAP auto 5-15  Script sent changing Adderall to 20 mg XR, 1 daily. We can change again if it isn't what you need.

## 2021-03-31 NOTE — Assessment & Plan Note (Signed)
Residual tiredness despite CPAP.  I am pleased he is finding Adderall XR stronger than he needs. Plan-sleep habits reviewed.  Reduce Adderall XR to 20 mg daily as needed.

## 2021-03-31 NOTE — Assessment & Plan Note (Signed)
He benefits from CPAP.  Meeting compliance goals but there has been some slippage with a short nights and missed nights which we reviewed.  He thinks he can do better. Plan-continue auto 5-15

## 2021-04-03 ENCOUNTER — Other Ambulatory Visit (HOSPITAL_COMMUNITY): Payer: Self-pay

## 2021-04-03 MED ORDER — METFORMIN HCL 500 MG PO TABS
ORAL_TABLET | ORAL | 0 refills | Status: DC
  Filled 2021-04-03: qty 180, 90d supply, fill #0

## 2021-04-03 MED ORDER — ROSUVASTATIN CALCIUM 40 MG PO TABS
40.0000 mg | ORAL_TABLET | Freq: Every day | ORAL | 0 refills | Status: DC
  Filled 2021-04-03: qty 90, 90d supply, fill #0

## 2021-04-03 MED ORDER — JARDIANCE 10 MG PO TABS
10.0000 mg | ORAL_TABLET | Freq: Every day | ORAL | 0 refills | Status: DC
  Filled 2021-04-03: qty 90, 90d supply, fill #0

## 2021-04-03 MED ORDER — EZETIMIBE 10 MG PO TABS
10.0000 mg | ORAL_TABLET | Freq: Every day | ORAL | 0 refills | Status: DC
  Filled 2021-04-03: qty 90, 90d supply, fill #0

## 2021-04-03 MED ORDER — FENOFIBRATE 160 MG PO TABS
160.0000 mg | ORAL_TABLET | Freq: Every day | ORAL | 0 refills | Status: DC
  Filled 2021-04-03: qty 90, 90d supply, fill #0

## 2021-04-04 ENCOUNTER — Other Ambulatory Visit (HOSPITAL_COMMUNITY): Payer: Self-pay

## 2021-04-04 MED ORDER — ALPRAZOLAM 0.5 MG PO TABS
ORAL_TABLET | ORAL | 1 refills | Status: DC
Start: 2021-04-03 — End: 2022-04-02
  Filled 2021-04-04: qty 60, 30d supply, fill #0
  Filled 2021-05-19: qty 60, 30d supply, fill #1

## 2021-05-19 ENCOUNTER — Other Ambulatory Visit (HOSPITAL_COMMUNITY): Payer: Self-pay

## 2021-05-22 ENCOUNTER — Other Ambulatory Visit (HOSPITAL_COMMUNITY): Payer: Self-pay

## 2021-06-14 ENCOUNTER — Other Ambulatory Visit (HOSPITAL_COMMUNITY): Payer: Self-pay

## 2021-06-14 DIAGNOSIS — M5136 Other intervertebral disc degeneration, lumbar region: Secondary | ICD-10-CM | POA: Diagnosis not present

## 2021-06-14 MED ORDER — OXYCODONE HCL 10 MG PO TABS
ORAL_TABLET | ORAL | 0 refills | Status: DC
  Filled 2021-06-14: qty 60, 30d supply, fill #0

## 2021-06-19 DIAGNOSIS — G4733 Obstructive sleep apnea (adult) (pediatric): Secondary | ICD-10-CM | POA: Diagnosis not present

## 2021-07-14 ENCOUNTER — Other Ambulatory Visit (HOSPITAL_COMMUNITY): Payer: Self-pay

## 2021-07-14 MED ORDER — ALPRAZOLAM 0.5 MG PO TABS
0.5000 mg | ORAL_TABLET | Freq: Two times a day (BID) | ORAL | 0 refills | Status: DC | PRN
  Filled 2021-07-14: qty 60, 30d supply, fill #0

## 2021-07-14 MED ORDER — EZETIMIBE 10 MG PO TABS
10.0000 mg | ORAL_TABLET | Freq: Every day | ORAL | 0 refills | Status: DC
  Filled 2021-07-14: qty 90, 90d supply, fill #0

## 2021-07-14 MED ORDER — METFORMIN HCL 500 MG PO TABS
500.0000 mg | ORAL_TABLET | Freq: Two times a day (BID) | ORAL | 0 refills | Status: DC
  Filled 2021-07-14: qty 180, 90d supply, fill #0

## 2021-07-14 MED ORDER — ICOSAPENT ETHYL 1 G PO CAPS
ORAL_CAPSULE | ORAL | 0 refills | Status: DC
  Filled 2021-07-14: qty 120, 30d supply, fill #0

## 2021-07-14 MED ORDER — ROSUVASTATIN CALCIUM 40 MG PO TABS
40.0000 mg | ORAL_TABLET | Freq: Every day | ORAL | 0 refills | Status: DC
  Filled 2021-07-14: qty 90, 90d supply, fill #0

## 2021-07-14 MED ORDER — FENOFIBRATE 160 MG PO TABS
160.0000 mg | ORAL_TABLET | Freq: Every day | ORAL | 0 refills | Status: DC
  Filled 2021-07-14: qty 90, 90d supply, fill #0

## 2021-07-14 MED ORDER — JARDIANCE 10 MG PO TABS
10.0000 mg | ORAL_TABLET | Freq: Every day | ORAL | 0 refills | Status: DC
  Filled 2021-07-14: qty 90, 90d supply, fill #0

## 2021-07-17 ENCOUNTER — Other Ambulatory Visit (HOSPITAL_COMMUNITY): Payer: Self-pay

## 2021-07-26 DIAGNOSIS — F419 Anxiety disorder, unspecified: Secondary | ICD-10-CM | POA: Diagnosis not present

## 2021-07-26 DIAGNOSIS — E1140 Type 2 diabetes mellitus with diabetic neuropathy, unspecified: Secondary | ICD-10-CM | POA: Diagnosis not present

## 2021-07-26 DIAGNOSIS — N183 Chronic kidney disease, stage 3 unspecified: Secondary | ICD-10-CM | POA: Diagnosis not present

## 2021-07-26 DIAGNOSIS — I10 Essential (primary) hypertension: Secondary | ICD-10-CM | POA: Diagnosis not present

## 2021-07-26 DIAGNOSIS — G629 Polyneuropathy, unspecified: Secondary | ICD-10-CM | POA: Diagnosis not present

## 2021-08-02 ENCOUNTER — Other Ambulatory Visit (HOSPITAL_COMMUNITY): Payer: Self-pay

## 2021-08-02 ENCOUNTER — Other Ambulatory Visit: Payer: Self-pay | Admitting: Internal Medicine

## 2021-08-02 MED ORDER — AMPHETAMINE-DEXTROAMPHET ER 20 MG PO CP24
20.0000 mg | ORAL_CAPSULE | Freq: Every day | ORAL | 0 refills | Status: DC
  Filled 2021-08-02: qty 90, 90d supply, fill #0

## 2021-08-02 NOTE — Telephone Encounter (Signed)
Requested Prescriptions ? ? amphetamine-dextroamphetamine (ADDERALL XR) 20 MG 24 hr capsule  ?    ? Sig: Take 1 capsule (20 mg total) by mouth daily.  ? Disp:  30 capsule    Refills:  0  ? Start: 08/02/2021  ? Earliest Fill Date: 08/02/2021  ? Class: Normal  ? Non-formulary  ? Last ordered: 4 months ago by Deneise Lever, MD Last dispensed: 04/03/2021  ? Rx #: [id number]  ? Patient comment: Can this be prescribed for 90 days vs 30 days as on prior refills. I'll save a significant amount on amount paid out of pocket. Thank you so much for your care.  ?   ?To be filled at: [facility]  ?  ? ? ?Dr. Annamaria Boots, please advise. ?

## 2021-08-02 NOTE — Telephone Encounter (Signed)
Adderall script sent, changing to 90 days ?

## 2021-08-14 ENCOUNTER — Other Ambulatory Visit (HOSPITAL_COMMUNITY): Payer: Self-pay

## 2021-08-14 MED ORDER — OXYCODONE HCL 10 MG PO TABS
10.0000 mg | ORAL_TABLET | Freq: Two times a day (BID) | ORAL | 0 refills | Status: DC | PRN
  Filled 2021-08-14: qty 60, 30d supply, fill #0

## 2021-08-17 ENCOUNTER — Other Ambulatory Visit (HOSPITAL_COMMUNITY): Payer: Self-pay

## 2021-10-12 ENCOUNTER — Other Ambulatory Visit (HOSPITAL_COMMUNITY): Payer: Self-pay

## 2021-10-12 DIAGNOSIS — M47816 Spondylosis without myelopathy or radiculopathy, lumbar region: Secondary | ICD-10-CM | POA: Diagnosis not present

## 2021-10-12 DIAGNOSIS — Z79899 Other long term (current) drug therapy: Secondary | ICD-10-CM | POA: Diagnosis not present

## 2021-10-12 DIAGNOSIS — M5136 Other intervertebral disc degeneration, lumbar region: Secondary | ICD-10-CM | POA: Diagnosis not present

## 2021-10-12 DIAGNOSIS — M545 Low back pain, unspecified: Secondary | ICD-10-CM | POA: Diagnosis not present

## 2021-10-12 DIAGNOSIS — Z5181 Encounter for therapeutic drug level monitoring: Secondary | ICD-10-CM | POA: Diagnosis not present

## 2021-10-12 MED ORDER — OXYCODONE HCL 10 MG PO TABS
ORAL_TABLET | ORAL | 0 refills | Status: DC
  Filled 2021-10-12: qty 60, 30d supply, fill #0

## 2021-10-14 ENCOUNTER — Other Ambulatory Visit (HOSPITAL_COMMUNITY): Payer: Self-pay

## 2021-10-16 ENCOUNTER — Other Ambulatory Visit (HOSPITAL_COMMUNITY): Payer: Self-pay

## 2021-10-16 MED ORDER — EZETIMIBE 10 MG PO TABS
10.0000 mg | ORAL_TABLET | Freq: Every day | ORAL | 3 refills | Status: AC
  Filled 2021-10-16: qty 90, 90d supply, fill #0
  Filled 2022-02-03: qty 90, 90d supply, fill #1
  Filled 2022-05-09: qty 90, 90d supply, fill #2
  Filled 2022-08-12: qty 90, 90d supply, fill #3

## 2021-10-16 MED ORDER — OMEPRAZOLE 40 MG PO CPDR
40.0000 mg | DELAYED_RELEASE_CAPSULE | Freq: Every day | ORAL | 3 refills | Status: DC
Start: 2021-10-16 — End: 2022-11-19
  Filled 2021-10-16: qty 90, 90d supply, fill #0
  Filled 2022-02-03: qty 90, 90d supply, fill #1
  Filled 2022-05-09: qty 90, 90d supply, fill #2
  Filled 2022-08-12: qty 90, 90d supply, fill #3

## 2021-10-16 MED ORDER — JARDIANCE 10 MG PO TABS
10.0000 mg | ORAL_TABLET | Freq: Every day | ORAL | 3 refills | Status: DC
  Filled 2021-10-16: qty 90, 90d supply, fill #0
  Filled 2022-02-03: qty 90, 90d supply, fill #1
  Filled 2022-05-09: qty 90, 90d supply, fill #2
  Filled 2022-08-12: qty 90, 90d supply, fill #3

## 2021-10-16 MED ORDER — FENOFIBRATE 160 MG PO TABS
160.0000 mg | ORAL_TABLET | Freq: Every day | ORAL | 3 refills | Status: DC
  Filled 2021-10-16: qty 90, 90d supply, fill #0
  Filled 2022-02-03: qty 90, 90d supply, fill #1
  Filled 2022-05-09: qty 90, 90d supply, fill #2

## 2021-10-16 MED ORDER — AMITRIPTYLINE HCL 100 MG PO TABS
ORAL_TABLET | ORAL | 3 refills | Status: DC
  Filled 2021-10-16: qty 90, 90d supply, fill #0
  Filled 2022-02-03: qty 90, 90d supply, fill #1
  Filled 2022-05-09: qty 90, 90d supply, fill #2
  Filled 2022-08-12: qty 90, 90d supply, fill #3

## 2021-10-16 MED ORDER — TRULICITY 0.75 MG/0.5ML ~~LOC~~ SOAJ
SUBCUTANEOUS | 3 refills | Status: DC
  Filled 2021-10-16: qty 6, 84d supply, fill #0
  Filled 2022-02-03: qty 6, 84d supply, fill #1
  Filled 2022-05-09: qty 6, 84d supply, fill #2
  Filled 2022-08-12: qty 6, 84d supply, fill #3

## 2021-10-16 MED ORDER — METFORMIN HCL 500 MG PO TABS
500.0000 mg | ORAL_TABLET | Freq: Two times a day (BID) | ORAL | 3 refills | Status: DC
Start: 2021-10-16 — End: 2022-11-19
  Filled 2021-10-16: qty 180, 90d supply, fill #0
  Filled 2022-02-03: qty 180, 90d supply, fill #1
  Filled 2022-05-09: qty 180, 90d supply, fill #2
  Filled 2022-08-12: qty 180, 90d supply, fill #3

## 2021-10-16 MED ORDER — ROSUVASTATIN CALCIUM 40 MG PO TABS
40.0000 mg | ORAL_TABLET | Freq: Every day | ORAL | 3 refills | Status: DC
  Filled 2021-10-16: qty 90, 90d supply, fill #0
  Filled 2022-02-03: qty 90, 90d supply, fill #1
  Filled 2022-05-09: qty 90, 90d supply, fill #2
  Filled 2022-08-12: qty 90, 90d supply, fill #3

## 2021-10-16 MED ORDER — ATENOLOL 25 MG PO TABS
12.5000 mg | ORAL_TABLET | Freq: Two times a day (BID) | ORAL | 3 refills | Status: DC
  Filled 2021-10-16: qty 90, 90d supply, fill #0
  Filled 2022-02-03: qty 90, 90d supply, fill #1
  Filled 2022-05-09: qty 90, 90d supply, fill #2
  Filled 2022-08-12: qty 90, 90d supply, fill #3

## 2021-10-18 ENCOUNTER — Other Ambulatory Visit (HOSPITAL_COMMUNITY): Payer: Self-pay

## 2021-10-23 ENCOUNTER — Other Ambulatory Visit (HOSPITAL_COMMUNITY): Payer: Self-pay

## 2021-10-23 MED ORDER — ALPRAZOLAM 0.5 MG PO TABS
0.5000 mg | ORAL_TABLET | Freq: Two times a day (BID) | ORAL | 0 refills | Status: DC | PRN
  Filled 2021-10-23: qty 60, 30d supply, fill #0

## 2021-11-15 ENCOUNTER — Other Ambulatory Visit: Payer: Self-pay | Admitting: Internal Medicine

## 2021-11-15 NOTE — Telephone Encounter (Signed)
Dr. Annamaria Boots, please advise on refill request.  Allergies  Allergen Reactions   Acetaminophen Anaphylaxis   Nsaids Hives and Rash    Face only will spread if not caught early.  Can take ASA     Current Outpatient Medications:    ALPRAZolam (XANAX) 0.5 MG tablet, Take 1 tablet by mouth twice a day as needed for 30 days., Disp: 60 tablet, Rfl: 1   ALPRAZolam (XANAX) 0.5 MG tablet, Take 1 tablet by mouth 2 times daily as needed., Disp: 60 tablet, Rfl: 0   ALPRAZolam (XANAX) 0.5 MG tablet, Take 1 tablet (0.5 mg total) by mouth 2 (two) times daily as needed., Disp: 60 tablet, Rfl: 0   amitriptyline (ELAVIL) 100 MG tablet, Take 1 tablet by mouth at bedtime as needed., Disp: 90 tablet, Rfl: 3   amphetamine-dextroamphetamine (ADDERALL XR) 20 MG 24 hr capsule, Take 1 capsule (20 mg total) by mouth daily., Disp: 90 capsule, Rfl: 0   atenolol (TENORMIN) 25 MG tablet, Take 1/2 tablet by mouth twice daily, Disp: 90 tablet, Rfl: 3   COVID-19 mRNA bivalent vaccine, Pfizer, injection, Inject into the muscle., Disp: 0.3 mL, Rfl: 0   Dulaglutide (TRULICITY) 6.27 OJ/5.0KX SOPN, Inject 1 pen under the skin once a week, Disp: 6 mL, Rfl: 3   empagliflozin (JARDIANCE) 10 MG TABS tablet, TAKE 1 TABLET BY MOUTH ONCE DAILY, Disp: 90 tablet, Rfl: 3   ezetimibe (ZETIA) 10 MG tablet, Take 1 tablet by mouth once a day, Disp: 90 tablet, Rfl: 3   fenofibrate 160 MG tablet, TAKE 1 TABLET BY MOUTH ONCE DAILY, Disp: 90 tablet, Rfl: 3   icosapent Ethyl (VASCEPA) 1 g capsule, Take 2 capsules by mouth twice a day with food, Disp: 120 capsule, Rfl: 0   metFORMIN (GLUCOPHAGE) 500 MG tablet, TAKE 1 TABLET BY MOUTH TWICE DAILY WITH A MEAL, Disp: 180 tablet, Rfl: 3   omeprazole (PRILOSEC) 40 MG capsule, Take 1 capsule by mouth once a day, Disp: 90 capsule, Rfl: 3   Oxycodone HCl 10 MG TABS, Take 1 tablet by mouth twice a day as needed., Disp: 60 tablet, Rfl: 0   Oxycodone HCl 10 MG TABS, Take 1 tablet (10 mg total) by mouth 2 (two)  times daily as needed., Disp: 60 tablet, Rfl: 0   Oxycodone HCl 10 MG TABS, Take 1 tabletby mouth twice a day as needed., Disp: 60 tablet, Rfl: 0   rosuvastatin (CRESTOR) 40 MG tablet, TAKE 1 TABLET BY MOUTH ONCE DAILY, Disp: 90 tablet, Rfl: 3   vitamin B-12 (CYANOCOBALAMIN) 1000 MCG tablet, Take 1,000 mcg by mouth daily., Disp: , Rfl:

## 2021-11-16 ENCOUNTER — Other Ambulatory Visit (HOSPITAL_COMMUNITY): Payer: Self-pay

## 2021-11-16 MED ORDER — AMPHETAMINE-DEXTROAMPHET ER 20 MG PO CP24
20.0000 mg | ORAL_CAPSULE | Freq: Every day | ORAL | 0 refills | Status: DC
  Filled 2021-11-23: qty 90, 90d supply, fill #0

## 2021-11-16 NOTE — Telephone Encounter (Signed)
Adderall refilled

## 2021-11-23 ENCOUNTER — Other Ambulatory Visit (HOSPITAL_COMMUNITY): Payer: Self-pay

## 2021-12-26 ENCOUNTER — Other Ambulatory Visit (HOSPITAL_COMMUNITY): Payer: Self-pay

## 2021-12-27 ENCOUNTER — Other Ambulatory Visit (HOSPITAL_COMMUNITY): Payer: Self-pay

## 2021-12-27 MED ORDER — ALPRAZOLAM 0.5 MG PO TABS
0.5000 mg | ORAL_TABLET | Freq: Two times a day (BID) | ORAL | 2 refills | Status: DC
  Filled 2021-12-27: qty 60, 30d supply, fill #0
  Filled 2022-02-03: qty 60, 30d supply, fill #1
  Filled 2022-05-09: qty 60, 30d supply, fill #2

## 2022-01-04 ENCOUNTER — Other Ambulatory Visit (HOSPITAL_COMMUNITY): Payer: Self-pay

## 2022-01-05 DIAGNOSIS — H2513 Age-related nuclear cataract, bilateral: Secondary | ICD-10-CM | POA: Diagnosis not present

## 2022-01-05 DIAGNOSIS — E119 Type 2 diabetes mellitus without complications: Secondary | ICD-10-CM | POA: Diagnosis not present

## 2022-01-05 DIAGNOSIS — H35033 Hypertensive retinopathy, bilateral: Secondary | ICD-10-CM | POA: Diagnosis not present

## 2022-01-05 DIAGNOSIS — H353131 Nonexudative age-related macular degeneration, bilateral, early dry stage: Secondary | ICD-10-CM | POA: Diagnosis not present

## 2022-01-09 DIAGNOSIS — G4733 Obstructive sleep apnea (adult) (pediatric): Secondary | ICD-10-CM | POA: Diagnosis not present

## 2022-01-11 ENCOUNTER — Other Ambulatory Visit (HOSPITAL_COMMUNITY): Payer: Self-pay

## 2022-01-11 MED ORDER — OXYCODONE HCL 10 MG PO TABS
ORAL_TABLET | ORAL | 0 refills | Status: DC
Start: 2022-01-11 — End: 2022-04-02
  Filled 2022-01-11: qty 60, 30d supply, fill #0

## 2022-01-25 DIAGNOSIS — N183 Chronic kidney disease, stage 3 unspecified: Secondary | ICD-10-CM | POA: Diagnosis not present

## 2022-01-25 DIAGNOSIS — E559 Vitamin D deficiency, unspecified: Secondary | ICD-10-CM | POA: Diagnosis not present

## 2022-01-25 DIAGNOSIS — Z125 Encounter for screening for malignant neoplasm of prostate: Secondary | ICD-10-CM | POA: Diagnosis not present

## 2022-01-25 DIAGNOSIS — Z1211 Encounter for screening for malignant neoplasm of colon: Secondary | ICD-10-CM | POA: Diagnosis not present

## 2022-01-25 DIAGNOSIS — E1140 Type 2 diabetes mellitus with diabetic neuropathy, unspecified: Secondary | ICD-10-CM | POA: Diagnosis not present

## 2022-01-25 DIAGNOSIS — I10 Essential (primary) hypertension: Secondary | ICD-10-CM | POA: Diagnosis not present

## 2022-01-25 DIAGNOSIS — Z6829 Body mass index (BMI) 29.0-29.9, adult: Secondary | ICD-10-CM | POA: Diagnosis not present

## 2022-01-25 DIAGNOSIS — Z0000 Encounter for general adult medical examination without abnormal findings: Secondary | ICD-10-CM | POA: Diagnosis not present

## 2022-01-25 DIAGNOSIS — Z23 Encounter for immunization: Secondary | ICD-10-CM | POA: Diagnosis not present

## 2022-01-25 DIAGNOSIS — G629 Polyneuropathy, unspecified: Secondary | ICD-10-CM | POA: Diagnosis not present

## 2022-01-25 DIAGNOSIS — E782 Mixed hyperlipidemia: Secondary | ICD-10-CM | POA: Diagnosis not present

## 2022-02-03 ENCOUNTER — Encounter: Payer: Self-pay | Admitting: Internal Medicine

## 2022-02-03 DIAGNOSIS — Z23 Encounter for immunization: Secondary | ICD-10-CM | POA: Diagnosis not present

## 2022-02-07 ENCOUNTER — Other Ambulatory Visit (HOSPITAL_COMMUNITY): Payer: Self-pay

## 2022-02-12 ENCOUNTER — Other Ambulatory Visit (HOSPITAL_COMMUNITY): Payer: Self-pay

## 2022-02-12 DIAGNOSIS — M5136 Other intervertebral disc degeneration, lumbar region: Secondary | ICD-10-CM | POA: Diagnosis not present

## 2022-02-12 DIAGNOSIS — M47896 Other spondylosis, lumbar region: Secondary | ICD-10-CM | POA: Diagnosis not present

## 2022-02-12 MED ORDER — MORPHINE SULFATE 15 MG PO TABS
15.0000 mg | ORAL_TABLET | Freq: Two times a day (BID) | ORAL | 0 refills | Status: DC | PRN
  Filled 2022-02-12: qty 15, 8d supply, fill #0

## 2022-02-25 ENCOUNTER — Other Ambulatory Visit: Payer: Self-pay | Admitting: Internal Medicine

## 2022-02-26 ENCOUNTER — Other Ambulatory Visit (HOSPITAL_COMMUNITY): Payer: Self-pay

## 2022-02-26 MED ORDER — AMPHETAMINE-DEXTROAMPHET ER 20 MG PO CP24
20.0000 mg | ORAL_CAPSULE | Freq: Every day | ORAL | 0 refills | Status: DC
Start: 2022-02-26 — End: 2022-06-21
  Filled 2022-02-26: qty 90, 90d supply, fill #0

## 2022-02-26 NOTE — Telephone Encounter (Signed)
Adderall refilled

## 2022-02-26 NOTE — Telephone Encounter (Signed)
Patient is requesting a refill on his Adderall '20mg'$ . Patient was last seen on 03/31/21 by Dr. Annamaria Boots. He is currently scheduled for a F/U on 04/02/22. Last refill was on 11/16/21 for 90 capsules.   Dr. Annamaria Boots, please advise if you are ok with this refill. Thanks!

## 2022-03-12 ENCOUNTER — Other Ambulatory Visit (HOSPITAL_COMMUNITY): Payer: Self-pay

## 2022-03-13 ENCOUNTER — Other Ambulatory Visit (HOSPITAL_COMMUNITY): Payer: Self-pay

## 2022-03-13 MED ORDER — MORPHINE SULFATE 15 MG PO TABS
15.0000 mg | ORAL_TABLET | Freq: Two times a day (BID) | ORAL | 0 refills | Status: DC | PRN
Start: 2022-03-13 — End: 2022-04-05
  Filled 2022-03-13: qty 15, 8d supply, fill #0

## 2022-03-31 NOTE — Progress Notes (Unsigned)
HPI  male never smoker followed for OSA, complicated by HBP, GERD,  DM 2, hypothyroid, bariatric lap band surgery 2007, kidney stones   -----------------------------------------------------   03/31/2021- 71 year old male never smoker followed for OSA,  complicated by HBP, GERD, DM 2, hypothyroid, Bariatric lap band surgery 2007, HBP, lumbar DDD,  -alprazolam 0.5 mg bid, amitriptyline 100, adderall XR 30, CPAP  auto 5-15/ [facility] Download-compliance 76%, AHI 0.8/hr Body weight today-188 lbs Covid vax-4 Moderna, 1 Phizer Flu vax-had -----Patient states that he wants to talk about aderall and see if he can switch to something else. Otherwise he's doing good. He feels Adderall XR 30 is too strong.  He is open to trying a dose reduction but feels he still needs something to help with daytime sleepiness.  He continues to feel he is doing well with CPAP.  We looked at compliance noting missed and short nights.  Improving this may help. He had successful ablation procedure for lumbar spine pain. Note that weight has begun creeping up.  04/02/22- 71 year old male never smoker followed for OSA,  complicated by HBP, GERD, DM 2, hypothyroid, Bariatric lap band surgery 2007, HBP, lumbar DDD,  -alprazolam 0.5 mg bid, amitriptyline 100, adderall XR 30, CPAP  auto 5-15/ [facility] Download-compliance 83%, AHI 0.7/ hr Body weight today-192 lbs Covid vax-4 Moderna, 2 Phizer, 4 Moderna RSV vax- had Flu vax-had Reports doing well. Breathing is comfortable with no new concerns. Download reviewed. Sleeps better with CPAP  ROS-see HPI + = positive Constitutional:  , No-night sweats, fevers, chills, fatigue, lassitude. HEENT:   No-  headaches, difficulty swallowing, tooth/dental problems, sore throat,       No-  sneezing, itching, ear ache, nasal congestion, post nasal drip,  CV:  No-   chest pain, orthopnea, PND, swelling in lower extremities, anasarca,  dizziness, palpitations Resp: No-   shortness of brea th  with exertion or at rest.              No-   productive cough,  No non-productive cough,  No- coughing up of blood.              No-   change in color of mucus.  No- wheezing.   Skin: No-   rash or lesions. GI:  No-   heartburn, indigestion, abdominal pain, nausea, vomiting,  GU: MS:  No-   joint pain or swelling.   Neuro-     nothing unusual Psych:  No- change in mood or affect. No depression or anxiety.  No memory loss.  OBJ- Physical Exam General- Alert, Oriented, Affect-appropriate, Distress- none acute, Not obese Skin- rash-none, lesions- none, excoriation- none Lymphadenopathy- none Head- atraumatic            Eyes- Gross vision intact, PERRLA, conjunctivae and secretions clear            Ears- Hearing, canals-normal            Nose- Clear, no-Septal dev, mucus, polyps, erosion, perforation             Throat- Mallampati III , mucosa clear , drainage- none, tonsils- atrophic Neck- flexible , trachea midline, no stridor , thyroid nl, carotid no bruit Chest - symmetrical excursion , unlabored           Heart/CV- RRR , no murmur , no gallop  , no rub, nl s1 s2                           -  JVD- none , edema- none, stasis changes- none, varices- none           Lung- clear to P&A, wheeze- none, cough- none , dullness-none, rub- none           Chest wall-  Abd-  Br/ Gen/ Rectal- Not done, not indicated Extrem- cyanosis- none, clubbing, none, atrophy- none, strength- nl Neuro- grossly intact to observation

## 2022-04-02 ENCOUNTER — Ambulatory Visit (INDEPENDENT_AMBULATORY_CARE_PROVIDER_SITE_OTHER): Payer: 59 | Admitting: Internal Medicine

## 2022-04-02 ENCOUNTER — Encounter: Payer: Self-pay | Admitting: Internal Medicine

## 2022-04-02 VITALS — BP 112/72 | HR 75 | Ht 70.0 in | Wt 192.6 lb

## 2022-04-02 DIAGNOSIS — J690 Pneumonitis due to inhalation of food and vomit: Secondary | ICD-10-CM | POA: Diagnosis not present

## 2022-04-02 DIAGNOSIS — G4733 Obstructive sleep apnea (adult) (pediatric): Secondary | ICD-10-CM

## 2022-04-02 NOTE — Patient Instructions (Signed)
We can continue CPAP auto 5-15. Glad you are doing well.  You can ask [facility] when you will be eligible to replace your machine.

## 2022-04-02 NOTE — Assessment & Plan Note (Signed)
No recurrence, doing well.

## 2022-04-02 NOTE — Assessment & Plan Note (Signed)
Benefits from CPAP with good compliance and control Plan- continue auto 5-15 

## 2022-04-04 ENCOUNTER — Other Ambulatory Visit (HOSPITAL_COMMUNITY): Payer: Self-pay

## 2022-04-05 ENCOUNTER — Other Ambulatory Visit (HOSPITAL_COMMUNITY): Payer: Self-pay

## 2022-04-05 MED ORDER — MORPHINE SULFATE 15 MG PO TABS
15.0000 mg | ORAL_TABLET | Freq: Two times a day (BID) | ORAL | 0 refills | Status: DC | PRN
Start: 2022-04-05 — End: 2023-04-04
  Filled 2022-04-05: qty 60, 30d supply, fill #0

## 2022-04-10 DIAGNOSIS — G4733 Obstructive sleep apnea (adult) (pediatric): Secondary | ICD-10-CM | POA: Diagnosis not present

## 2022-04-16 DIAGNOSIS — M47896 Other spondylosis, lumbar region: Secondary | ICD-10-CM | POA: Diagnosis not present

## 2022-04-16 DIAGNOSIS — M5136 Other intervertebral disc degeneration, lumbar region: Secondary | ICD-10-CM | POA: Diagnosis not present

## 2022-04-20 DIAGNOSIS — M47816 Spondylosis without myelopathy or radiculopathy, lumbar region: Secondary | ICD-10-CM | POA: Diagnosis not present

## 2022-04-20 DIAGNOSIS — M47896 Other spondylosis, lumbar region: Secondary | ICD-10-CM | POA: Diagnosis not present

## 2022-05-09 ENCOUNTER — Other Ambulatory Visit: Payer: Self-pay

## 2022-05-11 ENCOUNTER — Other Ambulatory Visit (HOSPITAL_COMMUNITY): Payer: Self-pay

## 2022-05-25 ENCOUNTER — Other Ambulatory Visit (HOSPITAL_COMMUNITY): Payer: Self-pay

## 2022-05-25 MED ORDER — MORPHINE SULFATE 15 MG PO TABS
ORAL_TABLET | ORAL | 0 refills | Status: DC
  Filled 2022-05-25: qty 60, 30d supply, fill #0

## 2022-06-01 DIAGNOSIS — M47816 Spondylosis without myelopathy or radiculopathy, lumbar region: Secondary | ICD-10-CM | POA: Diagnosis not present

## 2022-06-01 DIAGNOSIS — M5136 Other intervertebral disc degeneration, lumbar region: Secondary | ICD-10-CM | POA: Diagnosis not present

## 2022-06-20 ENCOUNTER — Other Ambulatory Visit (HOSPITAL_COMMUNITY): Payer: Self-pay

## 2022-06-21 ENCOUNTER — Other Ambulatory Visit: Payer: Self-pay | Admitting: Internal Medicine

## 2022-06-22 NOTE — Telephone Encounter (Signed)
Please advise on refill request    Current Outpatient Medications:    ALPRAZolam (XANAX) 0.5 MG tablet, Take 1 tablet (0.5 mg total) by mouth 2 (two) times daily as needed, Disp: 60 tablet, Rfl: 2   amitriptyline (ELAVIL) 100 MG tablet, Take 1 tablet by mouth at bedtime as needed., Disp: 90 tablet, Rfl: 3   amphetamine-dextroamphetamine (ADDERALL XR) 20 MG 24 hr capsule, Take 1 capsule (20 mg total) by mouth daily., Disp: 90 capsule, Rfl: 0   atenolol (TENORMIN) 25 MG tablet, Take 1/2 tablet by mouth twice daily, Disp: 90 tablet, Rfl: 3   Dulaglutide (TRULICITY) A999333 [id number] SOPN, Inject 1 pen under the skin once a week, Disp: 6 mL, Rfl: 3   empagliflozin (JARDIANCE) 10 MG TABS tablet, TAKE 1 TABLET BY MOUTH ONCE DAILY, Disp: 90 tablet, Rfl: 3   ezetimibe (ZETIA) 10 MG tablet, Take 1 tablet by mouth once a day, Disp: 90 tablet, Rfl: 3   fenofibrate 160 MG tablet, TAKE 1 TABLET BY MOUTH ONCE DAILY, Disp: 90 tablet, Rfl: 3   icosapent Ethyl (VASCEPA) 1 g capsule, Take 2 capsules by mouth twice a day with food, Disp: 120 capsule, Rfl: 0   metFORMIN (GLUCOPHAGE) 500 MG tablet, TAKE 1 TABLET BY MOUTH TWICE DAILY WITH A MEAL, Disp: 180 tablet, Rfl: 3   morphine (MSIR) 15 MG tablet, Take 1 tablet (15 mg total) by mouth 2 (two) times daily as needed., Disp: 60 tablet, Rfl: 0   morphine (MSIR) 15 MG tablet, Take 1 tablet by mouth twice daily as needed, Disp: 60 tablet, Rfl: 0   omeprazole (PRILOSEC) 40 MG capsule, Take 1 capsule by mouth once a day, Disp: 90 capsule, Rfl: 3   rosuvastatin (CRESTOR) 40 MG tablet, TAKE 1 TABLET BY MOUTH ONCE DAILY, Disp: 90 tablet, Rfl: 3   vitamin B-12 (CYANOCOBALAMIN) 1000 MCG tablet, Take 1,000 mcg by mouth daily., Disp: , Rfl:    Allergies  Allergen Reactions   Acetaminophen Anaphylaxis   Nsaids Hives and Rash    Face only will spread if not caught early.  Can take ASA

## 2022-06-25 ENCOUNTER — Other Ambulatory Visit (HOSPITAL_COMMUNITY): Payer: Self-pay

## 2022-06-25 MED ORDER — AMPHETAMINE-DEXTROAMPHET ER 20 MG PO CP24
20.0000 mg | ORAL_CAPSULE | Freq: Every day | ORAL | 0 refills | Status: DC
  Filled 2022-06-25: qty 90, 90d supply, fill #0

## 2022-06-25 NOTE — Telephone Encounter (Signed)
Adderall refilled at [facility]

## 2022-07-11 ENCOUNTER — Other Ambulatory Visit (HOSPITAL_COMMUNITY): Payer: Self-pay

## 2022-07-11 MED ORDER — MORPHINE SULFATE 15 MG PO TABS
15.0000 mg | ORAL_TABLET | Freq: Two times a day (BID) | ORAL | 0 refills | Status: DC | PRN
  Filled 2022-07-11: qty 60, 30d supply, fill #0

## 2022-07-24 ENCOUNTER — Other Ambulatory Visit (HOSPITAL_COMMUNITY): Payer: Self-pay

## 2022-07-25 ENCOUNTER — Other Ambulatory Visit (HOSPITAL_COMMUNITY): Payer: Self-pay

## 2022-07-25 MED ORDER — ALPRAZOLAM 0.5 MG PO TABS
0.5000 mg | ORAL_TABLET | Freq: Two times a day (BID) | ORAL | 0 refills | Status: DC | PRN
  Filled 2022-07-25: qty 60, 30d supply, fill #0

## 2022-07-26 ENCOUNTER — Other Ambulatory Visit (HOSPITAL_COMMUNITY): Payer: Self-pay

## 2022-07-29 ENCOUNTER — Other Ambulatory Visit (HOSPITAL_COMMUNITY): Payer: Self-pay

## 2022-07-30 ENCOUNTER — Other Ambulatory Visit (HOSPITAL_COMMUNITY): Payer: Self-pay

## 2022-07-31 ENCOUNTER — Other Ambulatory Visit (HOSPITAL_COMMUNITY): Payer: Self-pay

## 2022-08-01 ENCOUNTER — Other Ambulatory Visit (HOSPITAL_COMMUNITY): Payer: Self-pay

## 2022-08-03 ENCOUNTER — Other Ambulatory Visit (HOSPITAL_COMMUNITY): Payer: Self-pay

## 2022-08-03 DIAGNOSIS — N529 Male erectile dysfunction, unspecified: Secondary | ICD-10-CM | POA: Diagnosis not present

## 2022-08-03 DIAGNOSIS — N183 Chronic kidney disease, stage 3 unspecified: Secondary | ICD-10-CM | POA: Diagnosis not present

## 2022-08-03 DIAGNOSIS — Z1211 Encounter for screening for malignant neoplasm of colon: Secondary | ICD-10-CM | POA: Diagnosis not present

## 2022-08-03 DIAGNOSIS — F419 Anxiety disorder, unspecified: Secondary | ICD-10-CM | POA: Diagnosis not present

## 2022-08-03 DIAGNOSIS — E782 Mixed hyperlipidemia: Secondary | ICD-10-CM | POA: Diagnosis not present

## 2022-08-03 DIAGNOSIS — E1140 Type 2 diabetes mellitus with diabetic neuropathy, unspecified: Secondary | ICD-10-CM | POA: Diagnosis not present

## 2022-08-03 DIAGNOSIS — K219 Gastro-esophageal reflux disease without esophagitis: Secondary | ICD-10-CM | POA: Diagnosis not present

## 2022-08-03 DIAGNOSIS — I10 Essential (primary) hypertension: Secondary | ICD-10-CM | POA: Diagnosis not present

## 2022-08-03 MED ORDER — SILDENAFIL CITRATE 50 MG PO TABS
ORAL_TABLET | ORAL | 1 refills | Status: AC
  Filled 2022-08-03 (×2): qty 6, 30d supply, fill #0
  Filled 2022-08-04: qty 30, 30d supply, fill #1
  Filled 2022-10-19: qty 24, 24d supply, fill #2

## 2022-08-04 ENCOUNTER — Other Ambulatory Visit (HOSPITAL_COMMUNITY): Payer: Self-pay

## 2022-08-10 DIAGNOSIS — H35033 Hypertensive retinopathy, bilateral: Secondary | ICD-10-CM | POA: Diagnosis not present

## 2022-08-10 DIAGNOSIS — E119 Type 2 diabetes mellitus without complications: Secondary | ICD-10-CM | POA: Diagnosis not present

## 2022-08-10 DIAGNOSIS — H353131 Nonexudative age-related macular degeneration, bilateral, early dry stage: Secondary | ICD-10-CM | POA: Diagnosis not present

## 2022-08-10 DIAGNOSIS — H2513 Age-related nuclear cataract, bilateral: Secondary | ICD-10-CM | POA: Diagnosis not present

## 2022-08-12 ENCOUNTER — Other Ambulatory Visit (HOSPITAL_COMMUNITY): Payer: Self-pay

## 2022-08-13 ENCOUNTER — Other Ambulatory Visit (HOSPITAL_COMMUNITY): Payer: Self-pay

## 2022-08-14 ENCOUNTER — Other Ambulatory Visit (HOSPITAL_COMMUNITY): Payer: Self-pay

## 2022-08-14 MED ORDER — ICOSAPENT ETHYL 1 G PO CAPS
2.0000 g | ORAL_CAPSULE | Freq: Two times a day (BID) | ORAL | 0 refills | Status: DC
  Filled 2022-08-14: qty 360, 90d supply, fill #0

## 2022-08-15 ENCOUNTER — Other Ambulatory Visit (HOSPITAL_COMMUNITY): Payer: Self-pay

## 2022-08-16 ENCOUNTER — Other Ambulatory Visit: Payer: Self-pay

## 2022-08-17 ENCOUNTER — Other Ambulatory Visit (HOSPITAL_COMMUNITY): Payer: Self-pay

## 2022-08-24 ENCOUNTER — Other Ambulatory Visit: Payer: Self-pay

## 2022-09-07 ENCOUNTER — Other Ambulatory Visit (HOSPITAL_COMMUNITY): Payer: Self-pay

## 2022-09-10 ENCOUNTER — Other Ambulatory Visit (HOSPITAL_COMMUNITY): Payer: Self-pay

## 2022-09-10 ENCOUNTER — Other Ambulatory Visit: Payer: Self-pay

## 2022-09-10 MED ORDER — MORPHINE SULFATE 15 MG PO TABS
ORAL_TABLET | ORAL | 0 refills | Status: DC
  Filled 2022-09-10: qty 60, 30d supply, fill #0

## 2022-09-20 ENCOUNTER — Other Ambulatory Visit (HOSPITAL_COMMUNITY): Payer: Self-pay

## 2022-09-21 ENCOUNTER — Other Ambulatory Visit (HOSPITAL_COMMUNITY): Payer: Self-pay

## 2022-09-21 DIAGNOSIS — G4733 Obstructive sleep apnea (adult) (pediatric): Secondary | ICD-10-CM | POA: Diagnosis not present

## 2022-09-21 MED ORDER — ALPRAZOLAM 0.5 MG PO TABS
0.5000 mg | ORAL_TABLET | Freq: Two times a day (BID) | ORAL | 0 refills | Status: DC
  Filled 2022-09-21: qty 60, 30d supply, fill #0

## 2022-09-28 ENCOUNTER — Other Ambulatory Visit (HOSPITAL_COMMUNITY): Payer: Self-pay

## 2022-10-01 DIAGNOSIS — M546 Pain in thoracic spine: Secondary | ICD-10-CM | POA: Diagnosis not present

## 2022-10-01 DIAGNOSIS — M5136 Other intervertebral disc degeneration, lumbar region: Secondary | ICD-10-CM | POA: Diagnosis not present

## 2022-10-01 DIAGNOSIS — M47816 Spondylosis without myelopathy or radiculopathy, lumbar region: Secondary | ICD-10-CM | POA: Diagnosis not present

## 2022-10-10 ENCOUNTER — Other Ambulatory Visit (HOSPITAL_COMMUNITY): Payer: Self-pay

## 2022-10-10 ENCOUNTER — Other Ambulatory Visit: Payer: Self-pay | Admitting: Internal Medicine

## 2022-10-10 MED ORDER — AMPHETAMINE-DEXTROAMPHET ER 20 MG PO CP24
20.0000 mg | ORAL_CAPSULE | Freq: Every day | ORAL | 0 refills | Status: DC
  Filled 2022-10-10: qty 90, 90d supply, fill #0

## 2022-10-10 NOTE — Telephone Encounter (Signed)
Adderall refill sent to [facility]

## 2022-10-10 NOTE — Telephone Encounter (Signed)
Patient requesting refill for Adderall 20mg   Last office visit:04/03/23  Dr.Young can you  please advise.  Thank you

## 2022-10-11 ENCOUNTER — Other Ambulatory Visit (HOSPITAL_COMMUNITY): Payer: Self-pay

## 2022-10-11 ENCOUNTER — Encounter (HOSPITAL_COMMUNITY): Payer: Self-pay

## 2022-10-12 ENCOUNTER — Other Ambulatory Visit (HOSPITAL_COMMUNITY): Payer: Self-pay

## 2022-10-16 ENCOUNTER — Other Ambulatory Visit (HOSPITAL_COMMUNITY): Payer: Self-pay

## 2022-10-19 ENCOUNTER — Other Ambulatory Visit (HOSPITAL_COMMUNITY): Payer: Self-pay

## 2022-10-19 ENCOUNTER — Other Ambulatory Visit: Payer: Self-pay

## 2022-10-19 MED ORDER — MORPHINE SULFATE 15 MG PO TABS
15.0000 mg | ORAL_TABLET | Freq: Two times a day (BID) | ORAL | 0 refills | Status: DC | PRN
  Filled 2022-10-19: qty 60, 30d supply, fill #0

## 2022-10-22 ENCOUNTER — Other Ambulatory Visit (HOSPITAL_COMMUNITY): Payer: Self-pay

## 2022-10-22 DIAGNOSIS — K219 Gastro-esophageal reflux disease without esophagitis: Secondary | ICD-10-CM | POA: Diagnosis not present

## 2022-10-22 MED ORDER — PEG 3350-KCL-NA BICARB-NACL 420 G PO SOLR
ORAL | 0 refills | Status: DC
  Filled 2022-10-22: qty 4000, 1d supply, fill #0

## 2022-10-28 ENCOUNTER — Other Ambulatory Visit (HOSPITAL_COMMUNITY): Payer: Self-pay

## 2022-10-30 ENCOUNTER — Other Ambulatory Visit (HOSPITAL_COMMUNITY): Payer: Self-pay

## 2022-10-31 ENCOUNTER — Other Ambulatory Visit (HOSPITAL_COMMUNITY): Payer: Self-pay

## 2022-10-31 MED ORDER — ALPRAZOLAM 0.5 MG PO TABS
0.5000 mg | ORAL_TABLET | Freq: Two times a day (BID) | ORAL | 0 refills | Status: DC
  Filled 2022-10-31: qty 60, 30d supply, fill #0

## 2022-11-18 ENCOUNTER — Other Ambulatory Visit (HOSPITAL_COMMUNITY): Payer: Self-pay

## 2022-11-19 ENCOUNTER — Other Ambulatory Visit (HOSPITAL_COMMUNITY): Payer: Self-pay

## 2022-11-19 MED ORDER — AMITRIPTYLINE HCL 100 MG PO TABS
100.0000 mg | ORAL_TABLET | Freq: Every day | ORAL | 1 refills | Status: AC
Start: 2022-11-19 — End: ?
  Filled 2022-11-19: qty 90, 90d supply, fill #0
  Filled 2023-01-30: qty 90, 90d supply, fill #1

## 2022-11-19 MED ORDER — TRULICITY 0.75 MG/0.5ML ~~LOC~~ SOAJ
0.7500 mg | SUBCUTANEOUS | 1 refills | Status: DC
  Filled 2022-11-19: qty 6, 84d supply, fill #0
  Filled 2023-01-30: qty 6, 84d supply, fill #1

## 2022-11-19 MED ORDER — ROSUVASTATIN CALCIUM 40 MG PO TABS
40.0000 mg | ORAL_TABLET | Freq: Every day | ORAL | 1 refills | Status: AC
  Filled 2022-11-19: qty 90, 90d supply, fill #0
  Filled 2023-01-30: qty 90, 90d supply, fill #1

## 2022-11-19 MED ORDER — JARDIANCE 10 MG PO TABS
10.0000 mg | ORAL_TABLET | Freq: Every day | ORAL | 1 refills | Status: AC
  Filled 2022-11-19: qty 90, 90d supply, fill #0
  Filled 2023-01-30: qty 90, 90d supply, fill #1

## 2022-11-19 MED ORDER — OMEPRAZOLE 40 MG PO CPDR
40.0000 mg | DELAYED_RELEASE_CAPSULE | ORAL | 1 refills | Status: AC
  Filled 2022-11-19: qty 90, 90d supply, fill #0
  Filled 2023-01-30: qty 90, 90d supply, fill #1

## 2022-11-19 MED ORDER — ATENOLOL 25 MG PO TABS
25.0000 mg | ORAL_TABLET | Freq: Two times a day (BID) | ORAL | 1 refills | Status: AC
  Filled 2022-11-19: qty 180, 90d supply, fill #0
  Filled 2023-01-30: qty 180, 90d supply, fill #1

## 2022-11-19 MED ORDER — METFORMIN HCL 500 MG PO TABS
500.0000 mg | ORAL_TABLET | Freq: Two times a day (BID) | ORAL | 1 refills | Status: AC
  Filled 2022-11-19: qty 180, 90d supply, fill #0
  Filled 2023-01-30: qty 180, 90d supply, fill #1

## 2022-11-20 DIAGNOSIS — Z86010 Personal history of colonic polyps: Secondary | ICD-10-CM | POA: Diagnosis not present

## 2022-11-20 DIAGNOSIS — R12 Heartburn: Secondary | ICD-10-CM | POA: Diagnosis not present

## 2022-11-20 DIAGNOSIS — Z09 Encounter for follow-up examination after completed treatment for conditions other than malignant neoplasm: Secondary | ICD-10-CM | POA: Diagnosis not present

## 2022-11-20 DIAGNOSIS — K2950 Unspecified chronic gastritis without bleeding: Secondary | ICD-10-CM | POA: Diagnosis not present

## 2022-11-20 DIAGNOSIS — K224 Dyskinesia of esophagus: Secondary | ICD-10-CM | POA: Diagnosis not present

## 2022-11-20 DIAGNOSIS — K2930 Chronic superficial gastritis without bleeding: Secondary | ICD-10-CM | POA: Diagnosis not present

## 2022-11-21 ENCOUNTER — Other Ambulatory Visit (HOSPITAL_COMMUNITY): Payer: Self-pay

## 2022-11-28 DIAGNOSIS — K2930 Chronic superficial gastritis without bleeding: Secondary | ICD-10-CM | POA: Diagnosis not present

## 2022-12-11 ENCOUNTER — Other Ambulatory Visit (HOSPITAL_COMMUNITY): Payer: Self-pay

## 2022-12-12 ENCOUNTER — Other Ambulatory Visit (HOSPITAL_COMMUNITY): Payer: Self-pay

## 2022-12-12 MED ORDER — NALOXONE HCL 4 MG/0.1ML NA LIQD
NASAL | 0 refills | Status: DC
  Filled 2022-12-12: qty 2, 30d supply, fill #0

## 2022-12-12 MED ORDER — MORPHINE SULFATE 15 MG PO TABS
15.0000 mg | ORAL_TABLET | Freq: Two times a day (BID) | ORAL | 0 refills | Status: DC | PRN
  Filled 2022-12-12: qty 60, 30d supply, fill #0

## 2022-12-21 ENCOUNTER — Other Ambulatory Visit (HOSPITAL_COMMUNITY): Payer: Self-pay

## 2022-12-26 ENCOUNTER — Other Ambulatory Visit (HOSPITAL_COMMUNITY): Payer: Self-pay

## 2022-12-28 ENCOUNTER — Other Ambulatory Visit (HOSPITAL_COMMUNITY): Payer: Self-pay

## 2022-12-28 MED ORDER — ALPRAZOLAM 0.5 MG PO TABS
0.5000 mg | ORAL_TABLET | Freq: Two times a day (BID) | ORAL | 0 refills | Status: DC | PRN
  Filled 2022-12-28: qty 60, 30d supply, fill #0

## 2023-01-01 ENCOUNTER — Other Ambulatory Visit (HOSPITAL_COMMUNITY): Payer: Self-pay

## 2023-01-30 ENCOUNTER — Other Ambulatory Visit: Payer: Self-pay

## 2023-01-30 ENCOUNTER — Other Ambulatory Visit (HOSPITAL_COMMUNITY): Payer: Self-pay

## 2023-01-30 DIAGNOSIS — E559 Vitamin D deficiency, unspecified: Secondary | ICD-10-CM | POA: Diagnosis not present

## 2023-01-30 DIAGNOSIS — G629 Polyneuropathy, unspecified: Secondary | ICD-10-CM | POA: Diagnosis not present

## 2023-01-30 DIAGNOSIS — Z0000 Encounter for general adult medical examination without abnormal findings: Secondary | ICD-10-CM | POA: Diagnosis not present

## 2023-01-30 DIAGNOSIS — Z23 Encounter for immunization: Secondary | ICD-10-CM | POA: Diagnosis not present

## 2023-01-30 DIAGNOSIS — N183 Chronic kidney disease, stage 3 unspecified: Secondary | ICD-10-CM | POA: Diagnosis not present

## 2023-01-30 DIAGNOSIS — I10 Essential (primary) hypertension: Secondary | ICD-10-CM | POA: Diagnosis not present

## 2023-01-30 DIAGNOSIS — E782 Mixed hyperlipidemia: Secondary | ICD-10-CM | POA: Diagnosis not present

## 2023-01-30 DIAGNOSIS — E1142 Type 2 diabetes mellitus with diabetic polyneuropathy: Secondary | ICD-10-CM | POA: Diagnosis not present

## 2023-01-30 DIAGNOSIS — R490 Dysphonia: Secondary | ICD-10-CM | POA: Diagnosis not present

## 2023-01-30 DIAGNOSIS — E1140 Type 2 diabetes mellitus with diabetic neuropathy, unspecified: Secondary | ICD-10-CM | POA: Diagnosis not present

## 2023-01-30 MED ORDER — PREDNISONE 10 MG (21) PO TBPK
ORAL_TABLET | ORAL | 0 refills | Status: DC
  Filled 2023-01-30: qty 21, 6d supply, fill #0

## 2023-01-31 ENCOUNTER — Other Ambulatory Visit (HOSPITAL_COMMUNITY): Payer: Self-pay

## 2023-01-31 DIAGNOSIS — M5136 Other intervertebral disc degeneration, lumbar region with discogenic back pain only: Secondary | ICD-10-CM | POA: Diagnosis not present

## 2023-01-31 DIAGNOSIS — M47816 Spondylosis without myelopathy or radiculopathy, lumbar region: Secondary | ICD-10-CM | POA: Diagnosis not present

## 2023-01-31 MED ORDER — MORPHINE SULFATE 15 MG PO TABS
15.0000 mg | ORAL_TABLET | Freq: Two times a day (BID) | ORAL | 0 refills | Status: DC | PRN
  Filled 2023-01-31: qty 60, 30d supply, fill #0

## 2023-02-01 ENCOUNTER — Other Ambulatory Visit (HOSPITAL_COMMUNITY): Payer: Self-pay

## 2023-02-06 ENCOUNTER — Other Ambulatory Visit (HOSPITAL_COMMUNITY): Payer: Self-pay

## 2023-02-06 MED ORDER — ALPRAZOLAM 0.5 MG PO TABS
0.5000 mg | ORAL_TABLET | Freq: Two times a day (BID) | ORAL | 2 refills | Status: DC
Start: 2023-02-06 — End: 2024-03-23
  Filled 2023-02-06: qty 60, 30d supply, fill #0
  Filled 2023-03-24: qty 60, 30d supply, fill #1
  Filled 2023-06-07: qty 60, 30d supply, fill #2

## 2023-02-07 ENCOUNTER — Other Ambulatory Visit (HOSPITAL_COMMUNITY): Payer: Self-pay

## 2023-02-07 MED ORDER — PEG 3350-KCL-NA BICARB-NACL 420 G PO SOLR
ORAL | 0 refills | Status: DC
  Filled 2023-02-07: qty 4000, 1d supply, fill #0

## 2023-02-07 MED ORDER — BISACODYL 5 MG PO TBEC
DELAYED_RELEASE_TABLET | ORAL | 0 refills | Status: DC
  Filled 2023-02-07: qty 8, 1d supply, fill #0

## 2023-02-11 ENCOUNTER — Other Ambulatory Visit (HOSPITAL_COMMUNITY): Payer: Self-pay

## 2023-02-11 DIAGNOSIS — Z860101 Personal history of adenomatous and serrated colon polyps: Secondary | ICD-10-CM | POA: Diagnosis not present

## 2023-02-11 DIAGNOSIS — D125 Benign neoplasm of sigmoid colon: Secondary | ICD-10-CM | POA: Diagnosis not present

## 2023-02-11 MED ORDER — NALOXONE HCL 4 MG/0.1ML NA LIQD
NASAL | 0 refills | Status: DC
  Filled 2023-02-11: qty 2, 1d supply, fill #0

## 2023-02-12 ENCOUNTER — Other Ambulatory Visit (HOSPITAL_COMMUNITY): Payer: Self-pay

## 2023-03-18 DIAGNOSIS — N183 Chronic kidney disease, stage 3 unspecified: Secondary | ICD-10-CM | POA: Diagnosis not present

## 2023-03-19 ENCOUNTER — Other Ambulatory Visit (HOSPITAL_COMMUNITY): Payer: Self-pay

## 2023-03-24 ENCOUNTER — Other Ambulatory Visit (HOSPITAL_COMMUNITY): Payer: Self-pay

## 2023-03-25 ENCOUNTER — Other Ambulatory Visit (HOSPITAL_COMMUNITY): Payer: Self-pay

## 2023-03-25 ENCOUNTER — Other Ambulatory Visit: Payer: Self-pay

## 2023-03-25 MED ORDER — MORPHINE SULFATE 15 MG PO TABS
15.0000 mg | ORAL_TABLET | Freq: Two times a day (BID) | ORAL | 0 refills | Status: DC | PRN
Start: 2023-03-25 — End: 2023-06-03
  Filled 2023-03-25: qty 60, 30d supply, fill #0

## 2023-03-25 MED ORDER — NALOXONE HCL 4 MG/0.1ML NA LIQD
NASAL | 0 refills | Status: AC
  Filled 2023-03-25 – 2023-07-09 (×2): qty 2, 30d supply, fill #0
  Filled 2023-07-10: qty 2, 5d supply, fill #0

## 2023-03-27 ENCOUNTER — Other Ambulatory Visit (HOSPITAL_COMMUNITY): Payer: Self-pay

## 2023-04-03 NOTE — Progress Notes (Signed)
HPI  male never smoker followed for OSA, complicated by HBP, GERD,  DM 2, hypothyroid, bariatric lap band surgery 2007, kidney stones   -----------------------------------------------------   04/02/22- 72 year old male never smoker followed for OSA,  complicated by HBP, GERD, DM 2, hypothyroid, Bariatric lap band surgery 2007, HBP, lumbar DDD,  -alprazolam 0.5 mg bid, amitriptyline 100, adderall XR 30, CPAP  auto 5-15/ [employer] Download-compliance 83%, AHI 0.7/ hr Body weight today-192 lbs Covid vax-4 Moderna, 2 Phizer, 4 Moderna RSV vax- had Flu vax-had Reports doing well. Breathing is comfortable with no new concerns. Download reviewed. Sleeps better with CPAP  04/04/23-  72 year old male never smoker followed for OSA,  complicated by HBP, GERD, DM 2, hypothyroid, Bariatric lap band surgery 2007, HBP, lumbar DDD,  -alprazolam 0.5 mg bid, amitriptyline 100, adderall XR 30, CPAP  auto 5-15/ [employer] Discussed the use of AI scribe software for clinical note transcription with the patient, who gave verbal consent to proceed.  History of Present Illness   The patient, a 72 year old male with a history of obstructive sleep apnea, hypertension, GERD, type 2 diabetes, hypothyroidism, and lumbar degenerative disc disease, presents with chronic hoarseness and a dry cough. The hoarseness has been constant since October and has not improved with a course of prednisone. The patient notes that his voice temporarily improved following an endoscopy and colonoscopy in October, but the hoarseness returned after a week or two. The patient also reports chronic acid reflux and has been losing his voice over the past few months.  In addition to the hoarseness, the patient has a dry cough that is hard to stop once it starts. The patient is currently using a CPAP machine for his obstructive sleep apnea, with a compliance rate of 80% and an AHI of 3.1 per hour. The patient reports not sleeping well, despite taking  alprazolam and amitriptyline, which are known to aid sleep. The patient also occasionally takes morphine for back pain related to his lumbar degenerative disc disease.   His weight is now near goal after bariatric surgery, down from max around 400 lbs.     ROS-see HPI + = positive Constitutional:  , No-night sweats, fevers, chills, fatigue, lassitude. HEENT:   No-  headaches, difficulty swallowing, tooth/dental problems, sore throat,       No-  sneezing, itching, ear ache, nasal congestion, post nasal drip,  CV:  No-   chest pain, orthopnea, PND, swelling in lower extremities, anasarca,  dizziness, palpitations Resp: No-   shortness of brea th with exertion or at rest.              No-   productive cough,  + non-productive cough,  No- coughing up of blood.              No-   change in color of mucus.  No- wheezing.   Skin: No-   rash or lesions. GI:  No-   heartburn, indigestion, abdominal pain, nausea, vomiting,  GU: MS:  No-   joint pain or swelling.   Neuro-     nothing unusual Psych:  No- change in mood or affect. No depression or anxiety.  No memory loss.  OBJ- Physical Exam General- Alert, Oriented, Affect-appropriate, Distress- none acute, Not obese Skin- rash-none, lesions- none, excoriation- none Lymphadenopathy- none found Head- atraumatic            Eyes- Gross vision intact, PERRLA, conjunctivae and secretions clear            Ears-  Hearing, canals-normal            Nose- Clear, no-Septal dev, mucus, polyps, erosion, perforation             Throat- Mallampati III , mucosa clear , drainage- none, tonsils- atrophic, +hoarse without stridor Neck- flexible , trachea midline, no stridor , thyroid nl, carotid no bruit Chest - symmetrical excursion , unlabored           Heart/CV- RRR , no murmur , no gallop  , no rub, nl s1 s2                           - JVD- none , edema- none, stasis changes- none, varices- none           Lung- clear to P&A, wheeze- none, cough- none while  here , dullness-none, rub- none           Chest wall-  Abd-  Br/ Gen/ Rectal- Not done, not indicated Extrem- cyanosis- none, clubbing, none, atrophy- none, strength- nl Neuro- grossly intact to observation  Assessment and Plan    Hoarseness Chronic hoarseness since October, worsens in the evening. Brief improvement post-endoscopy. Associated with chronic acid reflux. Dry cough present. -Order chest X-ray today. -Refer to ENT for further evaluation.  Obstructive Sleep Apnea On CPAP therapy with pressure range 5-15. Compliance 80%, AHI 3.1 per hour. Reports not sleeping well. -Continue current CPAP settings. -Replace CPAP machine as it is due for replacement.  Gastroesophageal Reflux Disease Chronic acid reflux, possibly contributing to hoarseness. -Continue management with PCP.  Hypertension, GERD, DM2, Hypothyroidism, Lumbar Degenerative Disc Disease Chronic conditions, no acute issues discussed. -Continue current management.

## 2023-04-04 ENCOUNTER — Ambulatory Visit: Payer: Medicare Other | Admitting: Internal Medicine

## 2023-04-04 ENCOUNTER — Encounter: Payer: Self-pay | Admitting: Internal Medicine

## 2023-04-04 ENCOUNTER — Ambulatory Visit: Payer: Medicare Other

## 2023-04-04 VITALS — BP 124/76 | HR 85 | Temp 97.6°F | Ht 70.0 in | Wt 165.4 lb

## 2023-04-04 DIAGNOSIS — R490 Dysphonia: Secondary | ICD-10-CM

## 2023-04-04 DIAGNOSIS — R053 Chronic cough: Secondary | ICD-10-CM | POA: Diagnosis not present

## 2023-04-04 DIAGNOSIS — G4733 Obstructive sleep apnea (adult) (pediatric): Secondary | ICD-10-CM | POA: Diagnosis not present

## 2023-04-04 DIAGNOSIS — R059 Cough, unspecified: Secondary | ICD-10-CM | POA: Diagnosis not present

## 2023-04-04 DIAGNOSIS — K219 Gastro-esophageal reflux disease without esophagitis: Secondary | ICD-10-CM | POA: Diagnosis not present

## 2023-04-04 NOTE — Patient Instructions (Signed)
Order- refer to [facility] -  dx persistent hoarseness  Order- CXR  dx chronic cough  Order- DME [employer]- please replace old CPAP machine, auto 5-15, mask of choice, humidifier, supplies, AirView

## 2023-04-05 ENCOUNTER — Encounter (INDEPENDENT_AMBULATORY_CARE_PROVIDER_SITE_OTHER): Payer: Self-pay | Admitting: Otolaryngology

## 2023-04-18 ENCOUNTER — Other Ambulatory Visit: Payer: Self-pay | Admitting: Internal Medicine

## 2023-04-19 MED ORDER — AMPHETAMINE-DEXTROAMPHET ER 20 MG PO CP24
20.0000 mg | ORAL_CAPSULE | Freq: Every day | ORAL | 0 refills | Status: DC
Start: 2023-04-19 — End: 2023-08-17
  Filled 2023-04-19: qty 90, 90d supply, fill #0

## 2023-04-19 NOTE — Telephone Encounter (Signed)
Adderall refilled

## 2023-04-20 ENCOUNTER — Other Ambulatory Visit (HOSPITAL_COMMUNITY): Payer: Self-pay

## 2023-04-22 ENCOUNTER — Other Ambulatory Visit: Payer: Self-pay

## 2023-04-25 ENCOUNTER — Other Ambulatory Visit (HOSPITAL_COMMUNITY): Payer: Self-pay

## 2023-06-03 ENCOUNTER — Other Ambulatory Visit (HOSPITAL_COMMUNITY): Payer: Self-pay

## 2023-06-03 MED ORDER — MORPHINE SULFATE 15 MG PO TABS
15.0000 mg | ORAL_TABLET | Freq: Two times a day (BID) | ORAL | 0 refills | Status: DC | PRN
  Filled 2023-06-03: qty 60, 30d supply, fill #0

## 2023-06-04 ENCOUNTER — Other Ambulatory Visit (HOSPITAL_COMMUNITY): Payer: Self-pay

## 2023-06-07 ENCOUNTER — Other Ambulatory Visit: Payer: Self-pay

## 2023-06-10 ENCOUNTER — Other Ambulatory Visit (HOSPITAL_COMMUNITY): Payer: Self-pay

## 2023-06-10 ENCOUNTER — Ambulatory Visit (INDEPENDENT_AMBULATORY_CARE_PROVIDER_SITE_OTHER): Payer: Medicare Other | Admitting: Otolaryngology

## 2023-06-10 ENCOUNTER — Encounter (INDEPENDENT_AMBULATORY_CARE_PROVIDER_SITE_OTHER): Payer: Self-pay | Admitting: Otolaryngology

## 2023-06-10 VITALS — BP 114/70 | HR 79 | Ht 70.0 in | Wt 160.0 lb

## 2023-06-10 DIAGNOSIS — R490 Dysphonia: Secondary | ICD-10-CM | POA: Diagnosis not present

## 2023-06-10 DIAGNOSIS — G4733 Obstructive sleep apnea (adult) (pediatric): Secondary | ICD-10-CM | POA: Diagnosis not present

## 2023-06-10 DIAGNOSIS — K219 Gastro-esophageal reflux disease without esophagitis: Secondary | ICD-10-CM

## 2023-06-10 DIAGNOSIS — J383 Other diseases of vocal cords: Secondary | ICD-10-CM

## 2023-06-10 MED ORDER — CETIRIZINE HCL 10 MG PO TABS
10.0000 mg | ORAL_TABLET | Freq: Every day | ORAL | 11 refills | Status: AC
  Filled 2023-06-10: qty 30, 30d supply, fill #0
  Filled 2023-07-09: qty 30, 30d supply, fill #1
  Filled 2023-08-17: qty 30, 30d supply, fill #2
  Filled 2023-09-25: qty 30, 30d supply, fill #3
  Filled 2023-11-23 – 2023-12-10 (×2): qty 30, 30d supply, fill #4
  Filled 2024-01-09: qty 30, 30d supply, fill #5

## 2023-06-10 MED ORDER — FAMOTIDINE 20 MG PO TABS
20.0000 mg | ORAL_TABLET | Freq: Every day | ORAL | 3 refills | Status: AC
  Filled 2023-06-10: qty 30, 30d supply, fill #0
  Filled 2023-07-09: qty 30, 30d supply, fill #1
  Filled 2023-08-17 – 2024-01-09 (×4): qty 30, 30d supply, fill #2

## 2023-06-10 MED ORDER — SALINE SPRAY 0.65 % NA SOLN
1.0000 | NASAL | 6 refills | Status: AC | PRN
  Filled 2023-06-10 – 2023-07-09 (×2): qty 44, fill #0
  Filled 2023-07-11: qty 44, 20d supply, fill #0

## 2023-06-10 MED ORDER — FLUTICASONE PROPIONATE 50 MCG/ACT NA SUSP
2.0000 | Freq: Two times a day (BID) | NASAL | 6 refills | Status: DC
  Filled 2023-06-10: qty 16, 30d supply, fill #0
  Filled 2023-07-09: qty 16, 30d supply, fill #1
  Filled 2023-08-17 – 2023-09-24 (×4): qty 16, 30d supply, fill #2
  Filled 2023-11-07: qty 16, 30d supply, fill #3
  Filled 2023-12-10: qty 16, 30d supply, fill #4
  Filled 2024-01-09: qty 16, 30d supply, fill #5
  Filled 2024-02-05: qty 16, 30d supply, fill #6

## 2023-06-10 NOTE — Progress Notes (Signed)
 ENT CONSULT:  Reason for Consult: dysphonia x 6-7 mo   HPI: Discussed the use of AI scribe software for clinical note transcription with the patient, who gave verbal consent to proceed.  History of Present Illness   William Booth "[name]" is a 73 year old male who presents with worsening hoarseness for 6-7 months.  He has been experiencing voice changes for the past six to seven months, characterized by a lower, softer, and hoarse voice, particularly worsening with prolonged talking. He describes the sensation as 'like air is not moving through my larynx.' There is significant hoarseness and a 'grunty' quality to his voice, but no pain while talking, shortness of breath, or trouble swallowing.  He has a history of worsening reflux symptoms, described as 'reflux attacks' causing significant discomfort. He has a hx of gastric band procedure a few years ago and lost significant amount of weight with current BMI of 23. He carries a bottle of water to help alleviate these symptoms, noting that drinking water seems to 'wash the acid down.' He is currently taking 40 mg of Prilosec in the evenings for reflux management. He has not consulted with the surgeon who performed his gastric band surgery regarding these symptoms.  He uses an over-the-counter nasal spray, Afrin, for congestion, but not regularly. He experiences postnasal drainage and has not been on any allergy pills recently.  He uses a CPAP machine every night with a nasal mask and reports experiencing dry mouth more often than not. He has not had a recent sleep study since his significant weight loss over the past twelve years, which was gradual. He has been on Trulicity since its release for diabetes.      Records Reviewed:  Office visit with Dr Maple Hudson 04/04/23 male never smoker followed for OSA, complicated by HBP, GERD,  DM 2, hypothyroid, bariatric lap band surgery 2007, kidney stones   The patient, a 73 year old male with a history of  obstructive sleep apnea, hypertension, GERD, type 2 diabetes, hypothyroidism, and lumbar degenerative disc disease, presents with chronic hoarseness and a dry cough. The hoarseness has been constant since October and has not improved with a course of prednisone. The patient notes that his voice temporarily improved following an endoscopy and colonoscopy in October, but the hoarseness returned after a week or two. The patient also reports chronic acid reflux and has been losing his voice over the past few months.   In addition to the hoarseness, the patient has a dry cough that is hard to stop once it starts. The patient is currently using a CPAP machine for his obstructive sleep apnea, with a compliance rate of 80% and an AHI of 3.1 per hour. The patient reports not sleeping well, despite taking alprazolam and amitriptyline, which are known to aid sleep. The patient also occasionally takes morphine for back pain related to his lumbar degenerative disc disease.    His weight is now near goal after bariatric surgery, down from max around 400 lbs.   Hoarseness Chronic hoarseness since October, worsens in the evening. Brief improvement post-endoscopy. Associated with chronic acid reflux. Dry cough present. -Order chest X-ray today. -Refer to ENT for further evaluation.  Past Medical History:  Diagnosis Date   Anxiety    Diabetes mellitus    type 2   GERD (gastroesophageal reflux disease)    Hypertension    Hypothyroidism    pt states no thyroid problems   Low HDL (under 40)    Narcolepsy  Peripheral neuropathy    not taking any meds at this time   Sleep apnea    uses CPAP, last sleep study 1999, had titration 11/2011   Type II diabetes mellitus with stage 2 chronic kidney disease (HCC) 03/19/2016   Umbilical hernia     Past Surgical History:  Procedure Laterality Date   broken finger     right hand   CARDIAC CATHETERIZATION  06/2009   CLOSED REDUCTION FINGER WITH PERCUTANEOUS PINNING  Left 09/30/2013   Procedure: LEFT SMALL FINGER METACARPAL CLOSED REDUCTION WITH PERCUTANEOUS PINNING;  Surgeon: Sharma Covert, MD;  Location: [facility] OR;  Service: Orthopedics;  Laterality: Left;   COLONOSCOPY     CYSTOSCOPY W/ URETERAL STENT PLACEMENT Left 05/27/2017   Procedure: CYSTOSCOPY WITH RETROGRADE PYELOGRAM/URETERAL STENT PLACEMENT;  Surgeon: Rene Paci, MD;  Location: [facility];  Service: Urology;  Laterality: Left;  ONLY NEEDS 15 MIN   CYSTOSCOPY/URETEROSCOPY/HOLMIUM LASER/STENT PLACEMENT Left 06/07/2017   Procedure: CYSTOSCOPY/URETEROSCOPY/HOLMIUM LASER/STENT PLACEMENT;  Surgeon: Rene Paci, MD;  Location: [facility];  Service: Urology;  Laterality: Left;  ONLY NEEDS 30 MIN FOR PROCEDURE   GASTRIC RESTRICTION SURGERY  02/18/06   lap band   TOOTH EXTRACTION  02/28/12   UMBILICAL HERNIA REPAIR  03/20/2012   Procedure: LAPAROSCOPIC UMBILICAL HERNIA;  Surgeon: Kandis Cocking, MD;  Location: [facility] OR;  Service: General;  Laterality: N/A;    Family History  Problem Relation Age of Onset   Heart failure Mother    CAD Father    Diabetes Brother     Social History:  reports that he has never smoked. He has never used smokeless tobacco. He reports that he does not drink alcohol and does not use drugs.  Allergies:  Allergies  Allergen Reactions   Acetaminophen Anaphylaxis   Ibuprofen Anaphylaxis   Naproxen Sodium Anaphylaxis   Nsaids Hives, Rash and Anaphylaxis    Face only will spread if not caught early.  Can take ASA   Other Anaphylaxis   Latex Other (See Comments)    Medications: I have reviewed the patient's current medications.  The PMH, PSH, Medications, Allergies, and SH were reviewed and updated.  ROS: Constitutional: Negative for fever, weight loss and weight gain. Cardiovascular: Negative for chest pain and dyspnea on exertion. Respiratory: Is not experiencing shortness of breath at rest. Gastrointestinal:  Negative for nausea and vomiting. Neurological: Negative for headaches. Psychiatric: The patient is not nervous/anxious  Blood pressure 114/70, pulse 79, height 5\' 10"  (1.778 m), weight 160 lb (72.6 kg), SpO2 95%.  PHYSICAL EXAM:  Exam: General: Well-developed, well-nourished Communication and Voice: Clear pitch and clarity Respiratory Respiratory effort: Equal inspiration and expiration without stridor Cardiovascular Peripheral Vascular: Warm extremities with equal color/perfusion Eyes: No nystagmus with equal extraocular motion bilaterally Neuro/Psych/Balance: Patient oriented to person, place, and time; Appropriate mood and affect; Gait is intact with no imbalance; Cranial nerves I-XII are intact Head and Face Inspection: Normocephalic and atraumatic without mass or lesion Palpation: Facial skeleton intact without bony stepoffs Salivary Glands: No mass or tenderness Facial Strength: Facial motility symmetric and full bilaterally ENT Pinna: External ear intact and fully developed External canal: Canal is patent with intact skin Tympanic Membrane: Clear and mobile External Nose: No scar or anatomic deformity Internal Nose: Septum is deviated to the left. No polyp, or purulence. Mucosal edema and erythema present.  Bilateral inferior turbinate hypertrophy.  Lips, Teeth, and gums: Mucosa and teeth intact and viable TMJ: No pain to palpation  with full mobility Oral cavity/oropharynx: No erythema or exudate, no lesions present Nasopharynx: No mass or lesion with intact mucosa Hypopharynx: Intact mucosa without pooling of secretions Larynx Glottic: Full true vocal cord mobility without lesion or mass, VF atrophy and glottic insufficiency Supraglottic: Normal appearing epiglottis and AE folds Interarytenoid Space: Moderate pachydermia&edema Subglottic Space: Patent without lesion or edema Neck Neck and Trachea: Midline trachea without mass or lesion Thyroid: No mass or  nodularity Lymphatics: No lymphadenopathy  Procedure: Preoperative diagnosis: chronic hoarseness/dysphonia  Postoperative diagnosis:   Same + GERD LPR and VF atrophy/glottic insufficiency  Procedure: Flexible fiberoptic laryngoscopy  Surgeon: Ashok Croon, MD  Anesthesia: Topical lidocaine and Afrin Complications: None Condition is stable throughout exam  Indications and consent:  The patient presents to the clinic with Indirect laryngoscopy view was incomplete. Thus it was recommended that they undergo a flexible fiberoptic laryngoscopy. All of the risks, benefits, and potential complications were reviewed with the patient preoperatively and verbal informed consent was obtained.  Procedure: The patient was seated upright in the clinic. Topical lidocaine and Afrin were applied to the nasal cavity. After adequate anesthesia had occurred, I then proceeded to pass the flexible telescope into the nasal cavity. The nasal cavity was patent without rhinorrhea or polyp. The nasopharynx was also patent without mass or lesion. The base of tongue was visualized and was normal. There were no signs of pooling of secretions in the piriform sinuses. The true vocal folds were mobile bilaterally. There were no signs of glottic or supraglottic mucosal lesion or mass. There was moderate interarytenoid pachydermia and post cricoid edema. The telescope was then slowly withdrawn and the patient tolerated the procedure throughout.  Studies Reviewed: CXR 2 View  04/15/23 FINDINGS: The heart size and mediastinal contours are within normal limits. Both lungs are clear. Thoracic spine curvature and degenerative changes.   IMPRESSION: No active cardiopulmonary disease  Assessment/Plan: Encounter Diagnoses  Name Primary?   Age-related vocal fold atrophy    Glottic insufficiency    Dysphonia Yes   Chronic GERD    Obstructive sleep apnea     Assessment and Plan    Voice Changes/Dysphonia - exam with  VF atrophy and  glottic insufficiency Voice changes for 6-7 months, worsening with prolonged talking. Examination revealed thin VF with glottic insufficiency, likely due to vocal cord atrophy. No evidence of tumor or paralysis. Possible contributing factors include postnasal drainage and reflux changes, both noted on exam today. Discussed treatment options including optimizing reflux control, managing postnasal drainage, and voice therapy. If voice therapy fails, procedural interventions such as injection augmentation or medialization thyroplasty procedures may be considered. Emphasized that these procedures are not first-line treatments. Patient expressed understanding and willingness to try voice therapy first. - Refer to neuro rehab center for voice therapy - Prescribe Flonase nasal spray 2 puffs b/l nares twice a day - Recommend ocean spray TID  - Prescribe Zyrtec at night 10 mg daily - Instructed to take Prilosec 40 mg 30 minutes before breakfast and Pepcid 20 mg QHS - Prescribed Pepcid 20 mg to be taken at night - Recommend trial of reflux gourmet after meals  Chronic Gastroesophageal Reflux Disease (GERD) Worsened reflux symptoms, especially after gastric band surgery. Currently on Prilosec 40 mg in the evenings. Symptoms include severe reflux attacks and hoarseness. Discussed the potential link between gastric band surgery and increased reflux. Advised consulting with the gastric band surgeon to evaluate this relationship. - Instructed to take Prilosec 40 mg 30 minutes before breakfast and  Pepcid 20 mg QHS - Prescribed Pepcid 20 mg to be taken at night - Recommend trial of reflux gourmet after meals - Advise to consult with gastric band surgeon regarding reflux symptoms  Chronic nasal congestion, suspected environmental allergies and post-nasal drip - Prescribe Flonase nasal spray 2 puffs b/l nares twice a day - Recommend ocean spray TID  - Prescribe Zyrtec at night 10 mg daily - stop  Afrin use  Obstructive Sleep Apnea (OSA) on CPAP OSA managed with CPAP. Significant weight loss over 12 years. Last sleep study was a long time ago. Potential for resolution of OSA due to weight loss. Discussed the possibility of no longer needing CPAP if sleep study shows resolution of OSA. - Recommend repeat sleep study to evaluate current need for CPAP - he will discuss with his sleep physician    Follow-up - Schedule follow-up appointment after completion of voice therapy and trial of new medications.      Thank you for allowing me to participate in the care of this patient. Please do not hesitate to contact me with any questions or concerns.   Ashok Croon, MD Otolaryngology [facility] Phone: [phone] Fax: [phone]    06/11/2023, 5:03 PM

## 2023-06-11 DIAGNOSIS — M546 Pain in thoracic spine: Secondary | ICD-10-CM | POA: Diagnosis not present

## 2023-06-11 DIAGNOSIS — M545 Low back pain, unspecified: Secondary | ICD-10-CM | POA: Diagnosis not present

## 2023-06-11 DIAGNOSIS — M47816 Spondylosis without myelopathy or radiculopathy, lumbar region: Secondary | ICD-10-CM | POA: Diagnosis not present

## 2023-06-11 DIAGNOSIS — M5136 Other intervertebral disc degeneration, lumbar region with discogenic back pain only: Secondary | ICD-10-CM | POA: Diagnosis not present

## 2023-06-14 ENCOUNTER — Other Ambulatory Visit (HOSPITAL_COMMUNITY): Payer: Self-pay

## 2023-06-21 ENCOUNTER — Other Ambulatory Visit (HOSPITAL_COMMUNITY): Payer: Self-pay

## 2023-06-24 ENCOUNTER — Ambulatory Visit: Payer: Medicare Other | Admitting: Primary Care

## 2023-06-24 ENCOUNTER — Encounter: Payer: Self-pay | Admitting: Primary Care

## 2023-06-24 VITALS — BP 112/76 | HR 84 | Temp 98.6°F | Ht 70.0 in | Wt 156.4 lb

## 2023-06-24 DIAGNOSIS — R490 Dysphonia: Secondary | ICD-10-CM | POA: Diagnosis not present

## 2023-06-24 DIAGNOSIS — G4733 Obstructive sleep apnea (adult) (pediatric): Secondary | ICD-10-CM

## 2023-06-24 NOTE — Progress Notes (Signed)
 @Patient  ID: [name], male    DOB: Feb 02, 1951, 73 y.o.   MRN: [id number]  No chief complaint on file.   Referring provider: Catha Gosselin, MD  HPI: 73 year old male, never smoked. PMH significant for HTN, aspiration pneumonia, OSA, type 2 diabetes, hypersomnia, obesity s/p lap band.   06/24/2023 Discussed the use of AI scribe software for clinical note transcription with the patient, who gave verbal consent to proceed.  History of Present Illness   William Booth "William Booth" is a 73 year old male with obstructive sleep apnea who presents for a follow-up visit.  He has a history of obstructive sleep apnea and uses an auto CPAP machine with settings of 5 to 15 cm H2O. He maintains 97% compliance over the past 30 days, averaging 8 hours and 53 minutes of use per night. His average pressure is 7.5 cm H2O, and his apnea-hypopnea index is 2.2. No snoring, gasping, or choking during sleep and no excessive daytime sleepiness. He typically goes to bed at midnight, falls asleep immediately, wakes up once during the night, and starts his day around 10 AM. He does not take naps during the day.  He has a history of gastroesophageal reflux disease contributing to chronic acid reflux and voice hoarseness. He has been evaluated by an ear, nose, and throat specialist who noted vocal cord atrophy. He is scheduled to see a speech pathologist for vocal therapy and will follow up in April.  He has experienced significant weight loss from 400 pounds, achieved with a lap band procedure approximately 15 years ago.  He takes Adderall as needed due to hypersomnia, he was previously fallings asleep at work during meetings. He takes adderall approximately three to four times a week, and anticipates needing a refill in April.     Airview download 05/26/2023 - 06/24/2023 Usage days 29/30 days (97%); 25 days (83%) greater than 4 hours Average usage 8 hours 53 minutes Pressure 5 to 15 cm H2O (7.4 cm H2O-95%) Air  leaks 10.6 L/min (95%) AHI 2.2   Allergies  Allergen Reactions   Acetaminophen Anaphylaxis   Ibuprofen Anaphylaxis   Naproxen Sodium Anaphylaxis   Nsaids Hives, Rash and Anaphylaxis    Face only will spread if not caught early.  Can take ASA   Other Anaphylaxis   Latex Other (See Comments)    Immunization History  Administered Date(s) Administered   Influenza Split 02/22/2009, 03/20/2011, 01/04/2012, 02/08/2013, 01/28/2014, 12/08/2014, 01/29/2016, 12/23/2017, 01/15/2019   Influenza, High Dose Seasonal PF 01/15/2019, 01/24/2021   Influenza,inj,Quad PF,6+ Mos 02/09/2013, 01/22/2020   Influenza,inj,Quad PF,6-35 Mos 12/29/2016   Influenza,inj,quad, With Preservative 02/17/2014, 12/09/2015   Influenza-Unspecified 01/19/2022   Moderna Sars-Covid-2 Vaccination 05/25/2019, 06/22/2019, 02/20/2020, 07/28/2020   Pfizer Covid-19 Vaccine Bivalent Booster 35yrs & up 01/19/2021   Pneumococcal Conjugate-13 01/02/2018   Pneumococcal Polysaccharide-23 04/12/2008, 11/05/2013, 01/15/2019   Respiratory Syncytial Virus Vaccine,Recomb Aduvanted(Arexvy) 02/03/2022   Tdap 09/29/2010, 09/24/2013, 01/15/2019, 02/06/2022   Unspecified SARS-COV-2 Vaccination 01/12/2021   Zoster Recombinant(Shingrix) 12/29/2016, 10/18/2017, 02/06/2022   Zoster, Live 02/09/2013, 10/18/2017, 01/18/2018    Past Medical History:  Diagnosis Date   Anxiety    Diabetes mellitus    type 2   GERD (gastroesophageal reflux disease)    Hypertension    Hypothyroidism    pt states no thyroid problems   Low HDL (under 40)    Narcolepsy    Peripheral neuropathy    not taking any meds at this time   Sleep apnea  uses CPAP, last sleep study 1999, had titration 11/2011   Type II diabetes mellitus with stage 2 chronic kidney disease (HCC) 03/19/2016   Umbilical hernia     Tobacco History: Social History   Tobacco Use  Smoking Status Never  Smokeless Tobacco Never   Counseling given: Not Answered   Outpatient  Medications Prior to Visit  Medication Sig Dispense Refill   ALPRAZolam (XANAX) 0.5 MG tablet Take 1 tablet (0.5 mg total) by mouth twice a day as needed 60 tablet 2   amitriptyline (ELAVIL) 100 MG tablet Take 1 tablet (100 mg total) by mouth at bedtime. 90 tablet 1   amphetamine-dextroamphetamine (ADDERALL XR) 20 MG 24 hr capsule Take 1 capsule (20 mg) by mouth daily. 90 capsule 0   atenolol (TENORMIN) 25 MG tablet Take 1 tablet (25 mg total) by mouth 2 (two) times daily. 180 tablet 1   cetirizine (ZYRTEC) 10 MG tablet Take 1 tablet (10 mg total) by mouth daily. 30 tablet 11   Dulaglutide (TRULICITY) 0.75 MG/0.5ML SOPN Inject 0.75 mg into the skin once a week. 6 mL 1   empagliflozin (JARDIANCE) 10 MG TABS tablet Take 1 tablet (10 mg total) by mouth daily. 90 tablet 1   ezetimibe (ZETIA) 10 MG tablet Take 1 tablet by mouth once a day 90 tablet 3   famotidine (PEPCID) 20 MG tablet Take 1 tablet (20 mg total) by mouth at bedtime. 30 tablet 3   fluticasone (FLONASE) 50 MCG/ACT nasal spray Place 2 sprays into both nostrils 2 (two) times daily. 16 g 6   metFORMIN (GLUCOPHAGE) 500 MG tablet Take 1 tablet (500 mg total) by mouth 2 (two) times daily with a meal. 180 tablet 1   morphine (MSIR) 15 MG tablet Take 1 tablet (15 mg total) by mouth 2 (two) times daily as needed. 60 tablet 0   naloxone (NARCAN) nasal spray 4 mg/0.1 mL Place one spray in one nostril every 3 minutes until patient awakes or EMS arrives. 2 each 0   omeprazole (PRILOSEC) 40 MG capsule Take 1 capsule (40 mg total) by mouth 30 minutes before morning meal. 90 capsule 1   rosuvastatin (CRESTOR) 40 MG tablet Take 1 tablet (40 mg total) by mouth daily. 90 tablet 1   sildenafil (VIAGRA) 50 MG tablet Take 1 tablet by mouth once a day as needed, 30 minutes before sex 30 tablet 1   sodium chloride (OCEAN) 0.65 % SOLN nasal spray Place 1 spray into both nostrils as needed for congestion. 30 mL 6   vitamin B-12 (CYANOCOBALAMIN) 1000 MCG tablet  Take 1,000 mcg by mouth daily.     No facility-administered medications prior to visit.   Review of Systems  Review of Systems  Constitutional: Negative.  Negative for fatigue.  Respiratory: Negative.  Negative for apnea and shortness of breath.   Psychiatric/Behavioral:  Negative for sleep disturbance.     Physical Exam  There were no vitals taken for this visit. Physical Exam Constitutional:      General: He is not in acute distress.    Appearance: Normal appearance. He is not ill-appearing.  HENT:     Head: Normocephalic and atraumatic.     Mouth/Throat:     Mouth: Mucous membranes are moist.     Pharynx: Oropharynx is clear.  Cardiovascular:     Rate and Rhythm: Normal rate and regular rhythm.  Pulmonary:     Effort: Pulmonary effort is normal.     Breath sounds: Normal breath sounds.  Musculoskeletal:        General: Normal range of motion.  Skin:    General: Skin is warm and dry.  Neurological:     General: No focal deficit present.     Mental Status: He is alert and oriented to person, place, and time. Mental status is at baseline.  Psychiatric:        Mood and Affect: Mood normal.        Behavior: Behavior normal.        Thought Content: Thought content normal.        Judgment: Judgment normal.      Lab Results:  CBC    Component Value Date/Time   WBC 13.8 (H) 05/24/2017 0923   RBC 4.65 05/24/2017 0923   HGB 12.2 (L) 06/07/2017 0951   HCT 36.0 (L) 06/07/2017 0951   PLT 198 05/24/2017 0923   MCV 89.7 05/24/2017 0923   MCH 30.1 05/24/2017 0923   MCHC 33.6 05/24/2017 0923   RDW 14.1 05/24/2017 0923   LYMPHSABS 1.3 05/24/2017 0923   MONOABS 1.1 (H) 05/24/2017 0923   EOSABS 0.0 05/24/2017 0923   BASOSABS 0.0 05/24/2017 0923    BMET    Component Value Date/Time   NA 144 06/07/2017 0951   K 3.7 06/07/2017 0951   CL 104 05/24/2017 0923   CO2 23 05/24/2017 0923   GLUCOSE 110 (H) 06/07/2017 0951   BUN 27 (H) 05/24/2017 0923   CREATININE 1.90 (H)  05/24/2017 0923   CALCIUM 9.3 05/24/2017 0923   GFRNONAA 36 (L) 05/24/2017 0923   GFRAA 41 (L) 05/24/2017 0923    BNP No results found for: "BNP"  ProBNP No results found for: "PROBNP"  Imaging: No results found.   Assessment & Plan:   1. Obstructive sleep apnea (Primary)  2. Hoarseness    Obstructive Sleep Apnea (OSA) Patient received new CPAP unit since last OV/December 2024. Compliant with auto CPAP use (97% over the past 30 days, average use 8 hours 53 minutes per night). Apnea score of 2.2, indicating good control of OSA. No reported symptoms of snoring, gasping, or choking. -Continue current auto CPAP settings (5-15). -Annual follow-up visits.  Gastroesophageal Reflux Disease (GERD)/ voice hoarseness Chronic acid reflux contributing to voice hoarseness. Plan in place with ENT for speech therapy and potential vocal cord injections. -Continue current management plan with ENT and speech therapy.  Weight Loss Significant intentional weight loss following lap band surgery approximately 15 years ago. -Continue current lifestyle modifications.  Adderall Use Taken as needed for hypersomnia, approximately 3-4 times per week. -Refill prescription in April as needed.      Glenford Bayley, NP 06/24/2023

## 2023-06-24 NOTE — Patient Instructions (Signed)
-  OBSTRUCTIVE SLEEP APNEA (OSA): Obstructive Sleep Apnea is a condition where the airway becomes blocked during sleep, causing breathing pauses. You are doing well with your CPAP therapy, maintaining good compliance and control. Continue using your CPAP machine with the current settings (5-15 cm H2O) and follow up annually.  -GASTROESOPHAGEAL REFLUX DISEASE (GERD): GERD is a condition where stomach acid frequently flows back into the esophagus, causing irritation. This has contributed to your voice hoarseness. Continue with your current management plan, including your upcoming appointments with the ENT specialist and speech therapy.  -WEIGHT LOSS: You have achieved significant weight loss following your lap band surgery 15 years ago. Continue with your current lifestyle modifications to maintain your weight.  -ADDERALL USE: You take Adderall as needed, approximately 3-4 times per week. Plan to refill your prescription in April as needed.  INSTRUCTIONS:  Please continue with your current CPAP settings and follow up annually for your obstructive sleep apnea. Maintain your current management plan for GERD and attend your scheduled appointments with the ENT specialist and speech therapy. Keep up with your lifestyle modifications to maintain your weight loss. Plan to refill your Adderall prescription in April as needed.  Follow-up 1 year with Dr. Maple Hudson or sooner if needed

## 2023-06-25 ENCOUNTER — Other Ambulatory Visit: Payer: Self-pay

## 2023-06-25 ENCOUNTER — Ambulatory Visit: Payer: Medicare Other | Attending: Otolaryngology

## 2023-06-25 DIAGNOSIS — R490 Dysphonia: Secondary | ICD-10-CM | POA: Diagnosis not present

## 2023-06-25 DIAGNOSIS — J383 Other diseases of vocal cords: Secondary | ICD-10-CM | POA: Diagnosis not present

## 2023-06-25 NOTE — Patient Instructions (Addendum)
    USE abdominal breaths before you practice PHoRTE exercises  Sing using a straw for 2 minutes before before you practice PHoRTE exercises   PHoRTE VOICE EXERCISES - DO TWICE EACH DAY  1) Hold "AHHHHHHHHHHHHH" 10 times   Take a comfortable breath and hold it as long as you can with a strong energetic, intentional voice Use your abdominal muscles!   [email or web address]  2) Count 1-10   Start at as low pitch as you can, glide to higher than you think you can, then back down as low as you can   [email or web address]  =======================================================  WE WILL DO THIS LAST EXERCISE TOGETHER YOUR FIRST SESSION 3) Say 10 sentences -two different ways   (a) like you are calling over the fence to your neighbor in a higher-pitch voice  (b) like you are scolding your dog in a LOW authoritative voice   [email or web address]

## 2023-06-25 NOTE — Therapy (Signed)
 OUTPATIENT SPEECH LANGUAGE PATHOLOGY VOICE EVALUATION   Patient Name: William Booth MRN: [id number] DOB:1950-09-01, 73 y.o., male Today's Date: 06/25/2023  PCP: Peri Maris, FNP REFERRING PROVIDER: Ashok Croon, MD  END OF SESSION:  End of Session - 06/25/23 1909     Visit Number 1    Number of Visits 17    Date for SLP Re-Evaluation 08/24/23    Authorization Type [employer] Medicare    SLP Start Time 1319    SLP Stop Time  1401    SLP Time Calculation (min) 42 min    Activity Tolerance Patient tolerated treatment well             Past Medical History:  Diagnosis Date   Anxiety    Diabetes mellitus    type 2   GERD (gastroesophageal reflux disease)    Hypertension    Hypothyroidism    pt states no thyroid problems   Low HDL (under 40)    Narcolepsy    Peripheral neuropathy    not taking any meds at this time   Sleep apnea    uses CPAP, last sleep study 1999, had titration 11/2011   Type II diabetes mellitus with stage 2 chronic kidney disease (HCC) 03/19/2016   Umbilical hernia    Past Surgical History:  Procedure Laterality Date   broken finger     right hand   CARDIAC CATHETERIZATION  06/2009   CLOSED REDUCTION FINGER WITH PERCUTANEOUS PINNING Left 09/30/2013   Procedure: LEFT SMALL FINGER METACARPAL CLOSED REDUCTION WITH PERCUTANEOUS PINNING;  Surgeon: Sharma Covert, MD;  Location: [facility] OR;  Service: Orthopedics;  Laterality: Left;   COLONOSCOPY     CYSTOSCOPY W/ URETERAL STENT PLACEMENT Left 05/27/2017   Procedure: CYSTOSCOPY WITH RETROGRADE PYELOGRAM/URETERAL STENT PLACEMENT;  Surgeon: Rene Paci, MD;  Location: [facility];  Service: Urology;  Laterality: Left;  ONLY NEEDS 15 MIN   CYSTOSCOPY/URETEROSCOPY/HOLMIUM LASER/STENT PLACEMENT Left 06/07/2017   Procedure: CYSTOSCOPY/URETEROSCOPY/HOLMIUM LASER/STENT PLACEMENT;  Surgeon: Rene Paci, MD;  Location: [facility];  Service: Urology;  Laterality:  Left;  ONLY NEEDS 30 MIN FOR PROCEDURE   GASTRIC RESTRICTION SURGERY  02/18/06   lap band   TOOTH EXTRACTION  02/28/12   UMBILICAL HERNIA REPAIR  03/20/2012   Procedure: LAPAROSCOPIC UMBILICAL HERNIA;  Surgeon: Kandis Cocking, MD;  Location: [facility] OR;  Service: General;  Laterality: N/A;   Patient Active Problem List   Diagnosis Date Noted   Hypersomnia 03/31/2021   Sepsis due to pneumonia (HCC) 03/19/2016   Type II diabetes mellitus with stage 2 chronic kidney disease (HCC) 03/19/2016   Essential hypertension 03/19/2016   Aspiration pneumonia of left lower lobe (HCC) 03/19/2016   Hypothyroidism 03/19/2016   Obesity 04/13/2015   History of laparoscopic adjustable gastric banding, 02/18/2006. with truncal vagotomy 02/21/2012   Umbilical hernia 02/21/2012   Obstructive sleep apnea 12/08/2011    Onset date: Prior to October 2024  REFERRING DIAG: J38.3 (ICD-10-CM) - Age-related vocal fold atrophy J38.3 (ICD-10-CM) - Glottic insufficiency R49.0 (ICD-10-CM) - Dysphonia  THERAPY DIAG:  Hoarseness  Rationale for Evaluation and Treatment: Rehabilitation  SUBJECTIVE:   SUBJECTIVE STATEMENT: "By the end of the day, my voice is shot." Pt using alginate therapy and has seen improvement in GERD s/sx  Pt accompanied by: self  PERTINENT HISTORY: OSA  PAIN:  Are you having pain? No  FALLS: Has patient fallen in last 6 months? No  LIVING ENVIRONMENT: Lives with: lives with their spouse Lives  in: House/apartment  PLOF:Level of assistance: Independent with ADLs, Independent with IADLs Employment: Retired  PATIENT GOALS: Improve voice quality  OBJECTIVE:  Note: Objective measures were completed at Evaluation unless otherwise noted.  DIAGNOSTIC FINDINGS:  [facility] 06/10/23: Assessment and Plan Voice Changes/Dysphonia - exam with VF atrophy and  glottic insufficiency Voice changes for 6-7 months, worsening with prolonged talking. Examination revealed thin VF with glottic  insufficiency, likely due to vocal cord atrophy. No evidence of tumor or paralysis. Possible contributing factors include postnasal drainage and reflux changes, both noted on exam today. Discussed treatment options including optimizing reflux control, managing postnasal drainage, and voice therapy. If voice therapy fails, procedural interventions such as injection augmentation or medialization thyroplasty procedures may be considered. Emphasized that these procedures are not first-line treatments. Patient expressed understanding and willingness to try voice therapy first. - Refer to neuro rehab center for voice therapy - Prescribe Flonase nasal spray 2 puffs b/l nares twice a day - Recommend ocean spray TID  - Prescribe Zyrtec at night 10 mg daily - Instructed to take Prilosec 40 mg 30 minutes before breakfast and Pepcid 20 mg QHS - Prescribed Pepcid 20 mg to be taken at night - Recommend trial of reflux gourmet after meals   Chronic Gastroesophageal Reflux Disease (GERD) Worsened reflux symptoms, especially after gastric band surgery. Currently on Prilosec 40 mg in the evenings. Symptoms include severe reflux attacks and hoarseness. Discussed the potential link between gastric band surgery and increased reflux. Advised consulting with the gastric band surgeon to evaluate this relationship. 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 SLP suggested approx 75 oz.  Caffeine/alcohol intake: minimal/none Daily voice use: minimal - approx 3-4 hours/day  PERCEPTUAL VOICE ASSESSMENT: Voice quality: hoarse,  breathy, and low vocal intensity Vocal abuse:  none noted or reported today Resonance: normal Respiratory function: thoracic breathing and diaphragmatic/abdominal breathing(20/80, respectively)  OBJECTIVE VOICE ASSESSMENT: Maximum phonation time for sustained "ah": 11.35 Conversational pitch average: 164 Hz Conversational pitch range: 106-227  Hz Conversational loudness average: 68 dB Conversational loudness range: 52-84 dB S/z ratio: 1.27 (Suggestive of dysfunction >1.0)  PATIENT REPORTED OUTCOME MEASURES (PROM): V-RQOL: 70, indicating "fair" QOL due to voice sx, and VHI: 68, indicating moderate to severe handicap.                                                                                                                            TREATMENT DATE:  PhoRTE-Phonatory Resistance Training Exercises  06/25/23 (eval): SLP taught pt two/three PhoRTE exercises with initial model and consistent mod A faded to modified independent by  task end. SLP provided SLP's role in voice therapy and rationale for PhoRTE. Further, SLP explained rationale for incr'd daily water intake.  PATIENT EDUCATION: Education details: see "treatment note" above Person educated: Patient Education method: Explanation, Demonstration, Verbal cues, and Handouts Education comprehension: verbalized understanding, returned demonstration, verbal cues required, and needs further education  HOME EXERCISE PROGRAM: PhoRTE, BID  GOALS: Goals reviewed with patient? Yes, in general  SHORT TERM GOALS: Target date: 07/26/23  Pt will complete PhoRTE exercises with occasional min A in 3 sessions Baseline: Goal status: INITIAL  2.  Pt will incr water consumption to >70 oz for 5/7 days over two weeks Baseline:  Goal status: INITIAL  3.  Pt will report improvement (subjective) in his voice quality between 2 sessions  Baseline:  Goal status: INITIAL  4.  Pt will report better vocal stamina between 2 sessions Baseline:   Goal status: INITIAL  5.  Pt will demo AB 90% in 10 minutes simple conversation in two sessions Baseline:  Goal status: INITIAL  LONG TERM GOALS: Target date: 08/24/23  Pt will improve PROMs compared to initial administration Baseline:  Goal status: INITIAL  2.  Pt s/z ratio will improve to <1.1 after 08/12/23 Baseline:  Goal status: INITIAL  3.  Pt will complete PhoRTE exercises with modified independence between 2 sessions Baseline:  Goal status: INITIAL  4.  Pt will demo AB 90% in 10 minutes mod complex conversation in two sessions Baseline:  Goal status: INITIAL  ASSESSMENT:  CLINICAL IMPRESSION: Patient is a 73 y.o. M who was seen today for assessment of voice in light of vocal fold atrophy dx'd earlier this month. Pt has approx 65 oz H2O daily however states he experiences xerostomia consistently. SLP suggested incr water intake. Currently, pt experiences decr'd vocal stamina, and states his wife and family are "always" asking him to repeat due to voice being too soft. Additionally, pt would benefit from concentration on PhoRTE exercises to strengthen vocal fold musculature and on AB to maximize breath support for speech.   OBJECTIVE IMPAIRMENTS: include voice disorder. These impairments are limiting patient from managing medications, managing appointments, managing finances, household responsibilities, ADLs/IADLs, and effectively communicating at home and in community. Factors affecting potential to achieve goals and functional outcome are  none noted today .Marland Kitchen Patient will benefit from skilled SLP services to address above impairments and improve overall function.  REHAB POTENTIAL: Excellent  PLAN:  SLP FREQUENCY: 2x/week  SLP DURATION: 8 weeks  PLANNED INTERVENTIONS: Cueing hierachy, Internal/external aids, Oral motor exercises, Functional tasks, SLP instruction and feedback, Compensatory strategies, Patient/family education, and [id number] Treatment of speech (30 or 45  min)     Jecenia Leamer, CCC-SLP 06/25/2023, 7:10 PM

## 2023-07-01 ENCOUNTER — Ambulatory Visit: Payer: Medicare Other | Attending: Otolaryngology

## 2023-07-01 DIAGNOSIS — R490 Dysphonia: Secondary | ICD-10-CM | POA: Insufficient documentation

## 2023-07-01 NOTE — Therapy (Signed)
 OUTPATIENT SPEECH LANGUAGE PATHOLOGY VOICE EVALUATION   Patient Name: William Booth MRN: [id number] DOB:Oct 27, 1950, 73 y.o., male Today's Date: 07/01/2023  PCP: Peri Maris, FNP REFERRING PROVIDER: Ashok Croon, MD  END OF SESSION:  End of Session - 07/01/23 1625     Visit Number 2    Number of Visits 17    Date for SLP Re-Evaluation 08/24/23    Authorization Type [employer] Medicare    SLP Start Time 1622    SLP Stop Time  1700    SLP Time Calculation (min) 38 min    Activity Tolerance Patient tolerated treatment well             Past Medical History:  Diagnosis Date   Anxiety    Diabetes mellitus    type 2   GERD (gastroesophageal reflux disease)    Hypertension    Hypothyroidism    pt states no thyroid problems   Low HDL (under 40)    Narcolepsy    Peripheral neuropathy    not taking any meds at this time   Sleep apnea    uses CPAP, last sleep study 1999, had titration 11/2011   Type II diabetes mellitus with stage 2 chronic kidney disease (HCC) 03/19/2016   Umbilical hernia    Past Surgical History:  Procedure Laterality Date   broken finger     right hand   CARDIAC CATHETERIZATION  06/2009   CLOSED REDUCTION FINGER WITH PERCUTANEOUS PINNING Left 09/30/2013   Procedure: LEFT SMALL FINGER METACARPAL CLOSED REDUCTION WITH PERCUTANEOUS PINNING;  Surgeon: Sharma Covert, MD;  Location: [facility] OR;  Service: Orthopedics;  Laterality: Left;   COLONOSCOPY     CYSTOSCOPY W/ URETERAL STENT PLACEMENT Left 05/27/2017   Procedure: CYSTOSCOPY WITH RETROGRADE PYELOGRAM/URETERAL STENT PLACEMENT;  Surgeon: Rene Paci, MD;  Location: [facility];  Service: Urology;  Laterality: Left;  ONLY NEEDS 15 MIN   CYSTOSCOPY/URETEROSCOPY/HOLMIUM LASER/STENT PLACEMENT Left 06/07/2017   Procedure: CYSTOSCOPY/URETEROSCOPY/HOLMIUM LASER/STENT PLACEMENT;  Surgeon: Rene Paci, MD;  Location: [facility];  Service: Urology;  Laterality: Left;   ONLY NEEDS 30 MIN FOR PROCEDURE   GASTRIC RESTRICTION SURGERY  02/18/06   lap band   TOOTH EXTRACTION  02/28/12   UMBILICAL HERNIA REPAIR  03/20/2012   Procedure: LAPAROSCOPIC UMBILICAL HERNIA;  Surgeon: Kandis Cocking, MD;  Location: [facility] OR;  Service: General;  Laterality: N/A;   Patient Active Problem List   Diagnosis Date Noted   Hypersomnia 03/31/2021   Sepsis due to pneumonia (HCC) 03/19/2016   Type II diabetes mellitus with stage 2 chronic kidney disease (HCC) 03/19/2016   Essential hypertension 03/19/2016   Aspiration pneumonia of left lower lobe (HCC) 03/19/2016   Hypothyroidism 03/19/2016   Obesity 04/13/2015   History of laparoscopic adjustable gastric banding, 02/18/2006. with truncal vagotomy 02/21/2012   Umbilical hernia 02/21/2012   Obstructive sleep apnea 12/08/2011    Onset date: Prior to October 2024  REFERRING DIAG: J38.3 (ICD-10-CM) - Age-related vocal fold atrophy J38.3 (ICD-10-CM) - Glottic insufficiency R49.0 (ICD-10-CM) - Dysphonia  THERAPY DIAG:  Hoarseness  Rationale for Evaluation and Treatment: Rehabilitation  SUBJECTIVE:   SUBJECTIVE STATEMENT: "By the end of the day, my voice is shot." Pt using alginate therapy and has seen improvement in GERD s/sx  Pt accompanied by: self  PERTINENT HISTORY: OSA  PAIN:  Are you having pain? No  FALLS: Has patient fallen in last 6 months? No  LIVING ENVIRONMENT: Lives with: lives with their spouse Lives  in: House/apartment  PLOF:Level of assistance: Independent with ADLs, Independent with IADLs Employment: Retired  PATIENT GOALS: Improve voice quality  OBJECTIVE:  Note: Objective measures were completed at Evaluation unless otherwise noted.  DIAGNOSTIC FINDINGS:  [facility] 06/10/23: Assessment and Plan Voice Changes/Dysphonia - exam with VF atrophy and  glottic insufficiency Voice changes for 6-7 months, worsening with prolonged talking. Examination revealed thin VF with glottic  insufficiency, likely due to vocal cord atrophy. No evidence of tumor or paralysis. Possible contributing factors include postnasal drainage and reflux changes, both noted on exam today. Discussed treatment options including optimizing reflux control, managing postnasal drainage, and voice therapy. If voice therapy fails, procedural interventions such as injection augmentation or medialization thyroplasty procedures may be considered. Emphasized that these procedures are not first-line treatments. Patient expressed understanding and willingness to try voice therapy first. - Refer to neuro rehab center for voice therapy - Prescribe Flonase nasal spray 2 puffs b/l nares twice a day - Recommend ocean spray TID  - Prescribe Zyrtec at night 10 mg daily - Instructed to take Prilosec 40 mg 30 minutes before breakfast and Pepcid 20 mg QHS - Prescribed Pepcid 20 mg to be taken at night - Recommend trial of reflux gourmet after meals   Chronic Gastroesophageal Reflux Disease (GERD) Worsened reflux symptoms, especially after gastric band surgery. Currently on Prilosec 40 mg in the evenings. Symptoms include severe reflux attacks and hoarseness. Discussed the potential link between gastric band surgery and increased reflux. Advised consulting with the gastric band surgeon to evaluate this relationship. - Instructed to take Prilosec 40 mg 30 minutes before breakfast and Pepcid 20 mg QHS - Prescribed Pepcid 20 mg to be taken at night - Recommend trial of reflux gourmet after meals - Advise to consult with gastric band surgeon regarding reflux symptoms   PATIENT REPORTED OUTCOME MEASURES (PROM): V-RQOL: 70, indicating "fair" QOL due to voice sx, and VHI: 68, indicating moderate to severe handicap.                                                                                                                            TREATMENT DATE:  PhoRTE-Phonatory Resistance Training Exercises, Abdominal  breathing=AB  07/01/23: SLP reviewed goals with pt. Pt agreed that goals are appropriate. SLP assessed pt's use of AB at rest, in order to use for PhoRTE exercises. Pt 90%+ at rest. SLP worked with pt with PhoRTE exercises and pt req'd model for exercises initially for each exercise, faded to independent. SLP to show pt vocal function exercises next session. Pt working on increasing H2O consumption to >70oz/day.  06/25/23 (eval): SLP taught pt two/three PhoRTE exercises with initial model and consistent mod A faded to modified independent by task end. SLP provided SLP's role in voice therapy and rationale for PhoRTE. Further, SLP explained rationale for incr'd daily water intake.  PATIENT EDUCATION: Education details: see "treatment note" above Person educated: Patient Education method: Explanation, Demonstration, Verbal cues, and Handouts Education  comprehension: verbalized understanding, returned demonstration, verbal cues required, and needs further education  HOME EXERCISE PROGRAM: PhoRTE, BID  GOALS: Goals reviewed with patient? Yes, in general  SHORT TERM GOALS: Target date: 07/26/23  Pt will complete PhoRTE exercises with occasional min A in 3 sessions Baseline: Goal status: INITIAL  2.  Pt will incr water consumption to >70 oz for 5/7 days over two weeks Baseline:  Goal status: INITIAL  3.  Pt will report improvement (subjective) in his voice quality between 2 sessions  Baseline:  Goal status: INITIAL  4.  Pt will report better vocal stamina between 2 sessions Baseline:  Goal status: INITIAL  5.  Pt will demo AB 90% in 10 minutes simple conversation in two sessions Baseline:  Goal status: INITIAL  LONG TERM GOALS: Target date: 08/24/23  Pt will improve PROMs compared to initial administration Baseline:  Goal status: INITIAL  2.  Pt s/z ratio will improve to <1.1 after 08/12/23 Baseline:  Goal status: INITIAL  3.  Pt will complete PhoRTE exercises with modified  independence between 2 sessions Baseline:  Goal status: INITIAL  4.  Pt will demo AB 90% in 10 minutes mod complex conversation in two sessions Baseline:  Goal status: INITIAL  ASSESSMENT:  CLINICAL IMPRESSION: Patient is a 73 y.o. M who was seen today for treatment of voice in light of vocal fold atrophy dx'd earlier this month. Pt has approx 65 oz H2O daily however states he experiences xerostomia consistently. Currently, pt experiences decr'd vocal stamina, and states his wife and family are "always" asking him to repeat due to voice being too soft. Additionally, pt would benefit from concentration on PhoRTE exercises to strengthen vocal fold musculature and on AB to maximize breath support for speech.   OBJECTIVE IMPAIRMENTS: include voice disorder. These impairments are limiting patient from managing medications, managing appointments, managing finances, household responsibilities, ADLs/IADLs, and effectively communicating at home and in community. Factors affecting potential to achieve goals and functional outcome are  none noted today .Marland Kitchen Patient will benefit from skilled SLP services to address above impairments and improve overall function.  REHAB POTENTIAL: Excellent  PLAN:  SLP FREQUENCY: 2x/week  SLP DURATION: 8 weeks  PLANNED INTERVENTIONS: Cueing hierachy, Internal/external aids, Oral motor exercises, Functional tasks, SLP instruction and feedback, Compensatory strategies, Patient/family education, and [address]109 Treatment of speech (30 or 45 min)     Taunia Frasco, CCC-SLP 07/01/2023, 4:25 PM

## 2023-07-01 NOTE — Patient Instructions (Signed)
  Practice 10 abdominal breaths before you practice PHoRTE exercises  Sing using a straw for 2 minutes before before you practice PHoRTE exercises   PHoRTE VOICE EXERCISES - DO TWICE EACH DAY  1) Hold "AHHHHHHHHHHHHH" 10 times   As long as you can as strong as you can - push from your abdominal muscles!   [email or web address]  2) Count 1-10 (skip 7)   Start at as low pitch as you can, glide to as high pitch as you can, then back down as low as you can   [email or web address]  3) Say 10 sentences -two different ways   (a) like you are calling over the fence to your neighbor in a higher-pitch voice  (b) like you are scolding your dog in a LOW authoritative voice   [email or web address]

## 2023-07-04 ENCOUNTER — Ambulatory Visit

## 2023-07-09 ENCOUNTER — Other Ambulatory Visit (HOSPITAL_COMMUNITY): Payer: Self-pay

## 2023-07-10 ENCOUNTER — Ambulatory Visit: Payer: Medicare Other

## 2023-07-10 ENCOUNTER — Other Ambulatory Visit (HOSPITAL_COMMUNITY): Payer: Self-pay

## 2023-07-10 ENCOUNTER — Other Ambulatory Visit: Payer: Self-pay

## 2023-07-10 DIAGNOSIS — R490 Dysphonia: Secondary | ICD-10-CM | POA: Diagnosis not present

## 2023-07-10 MED ORDER — MORPHINE SULFATE 15 MG PO TABS
15.0000 mg | ORAL_TABLET | Freq: Two times a day (BID) | ORAL | 0 refills | Status: DC | PRN
  Filled 2023-07-10: qty 60, 30d supply, fill #0

## 2023-07-10 NOTE — Patient Instructions (Signed)
    VOCAL FUNCTION EXERCISES  YouTube: (start at 2:20) [email or web address]

## 2023-07-10 NOTE — Therapy (Signed)
 OUTPATIENT SPEECH LANGUAGE PATHOLOGY VOICE TREATMENT   Patient Name: William Booth MRN: [id number] DOB:07-Feb-1951, 73 y.o., male Today's Date: 07/10/2023  PCP: Peri Maris, FNP REFERRING PROVIDER: Ashok Croon, MD  END OF SESSION:  End of Session - 07/10/23 1711     Visit Number 3    Number of Visits 17    Date for SLP Re-Evaluation 08/24/23    Authorization Type UHC Medicare    SLP Start Time 1620    SLP Stop Time  1700    SLP Time Calculation (min) 40 min    Activity Tolerance Patient tolerated treatment well              Past Medical History:  Diagnosis Date   Anxiety    Diabetes mellitus    type 2   GERD (gastroesophageal reflux disease)    Hypertension    Hypothyroidism    pt states no thyroid problems   Low HDL (under 40)    Narcolepsy    Peripheral neuropathy    not taking any meds at this time   Sleep apnea    uses CPAP, last sleep study 1999, had titration 11/2011   Type II diabetes mellitus with stage 2 chronic kidney disease (HCC) 03/19/2016   Umbilical hernia    Past Surgical History:  Procedure Laterality Date   broken finger     right hand   CARDIAC CATHETERIZATION  06/2009   CLOSED REDUCTION FINGER WITH PERCUTANEOUS PINNING Left 09/30/2013   Procedure: LEFT SMALL FINGER METACARPAL CLOSED REDUCTION WITH PERCUTANEOUS PINNING;  Surgeon: Sharma Covert, MD;  Location: [facility] OR;  Service: Orthopedics;  Laterality: Left;   COLONOSCOPY     CYSTOSCOPY W/ URETERAL STENT PLACEMENT Left 05/27/2017   Procedure: CYSTOSCOPY WITH RETROGRADE PYELOGRAM/URETERAL STENT PLACEMENT;  Surgeon: Rene Paci, MD;  Location: [facility];  Service: Urology;  Laterality: Left;  ONLY NEEDS 15 MIN   CYSTOSCOPY/URETEROSCOPY/HOLMIUM LASER/STENT PLACEMENT Left 06/07/2017   Procedure: CYSTOSCOPY/URETEROSCOPY/HOLMIUM LASER/STENT PLACEMENT;  Surgeon: Rene Paci, MD;  Location: [facility];  Service: Urology;  Laterality:  Left;  ONLY NEEDS 30 MIN FOR PROCEDURE   GASTRIC RESTRICTION SURGERY  02/18/06   lap band   TOOTH EXTRACTION  02/28/12   UMBILICAL HERNIA REPAIR  03/20/2012   Procedure: LAPAROSCOPIC UMBILICAL HERNIA;  Surgeon: Kandis Cocking, MD;  Location: [facility] OR;  Service: General;  Laterality: N/A;   Patient Active Problem List   Diagnosis Date Noted   Hypersomnia 03/31/2021   Sepsis due to pneumonia (HCC) 03/19/2016   Type II diabetes mellitus with stage 2 chronic kidney disease (HCC) 03/19/2016   Essential hypertension 03/19/2016   Aspiration pneumonia of left lower lobe (HCC) 03/19/2016   Hypothyroidism 03/19/2016   Obesity 04/13/2015   History of laparoscopic adjustable gastric banding, 02/18/2006. with truncal vagotomy 02/21/2012   Umbilical hernia 02/21/2012   Obstructive sleep apnea 12/08/2011    Onset date: Prior to October 2024  REFERRING DIAG: J38.3 (ICD-10-CM) - Age-related vocal fold atrophy J38.3 (ICD-10-CM) - Glottic insufficiency R49.0 (ICD-10-CM) - Dysphonia  THERAPY DIAG:  Hoarseness  Rationale for Evaluation and Treatment: Rehabilitation  SUBJECTIVE:   SUBJECTIVE STATEMENT: "They have been going well." (Re: PhoRTE)  Pt accompanied by: self  PERTINENT HISTORY: OSA  PAIN:  Are you having pain? No  FALLS: Has patient fallen in last 6 months? No  LIVING ENVIRONMENT: Lives with: lives with their spouse Lives in: House/apartment  PLOF:Level of assistance: Independent with ADLs, Independent with IADLs Employment:  Retired  PATIENT GOALS: Improve voice quality  OBJECTIVE:  Note: Objective measures were completed at Evaluation unless otherwise noted.  DIAGNOSTIC FINDINGS:  [facility] 06/10/23: Assessment and Plan Voice Changes/Dysphonia - exam with VF atrophy and  glottic insufficiency Voice changes for 6-7 months, worsening with prolonged talking. Examination revealed thin VF with glottic insufficiency, likely due to vocal cord atrophy. No evidence of tumor or  paralysis. Possible contributing factors include postnasal drainage and reflux changes, both noted on exam today. Discussed treatment options including optimizing reflux control, managing postnasal drainage, and voice therapy. If voice therapy fails, procedural interventions such as injection augmentation or medialization thyroplasty procedures may be considered. Emphasized that these procedures are not first-line treatments. Patient expressed understanding and willingness to try voice therapy first. - Refer to neuro rehab center for voice therapy - Prescribe Flonase nasal spray 2 puffs b/l nares twice a day - Recommend ocean spray TID  - Prescribe Zyrtec at night 10 mg daily - Instructed to take Prilosec 40 mg 30 minutes before breakfast and Pepcid 20 mg QHS - Prescribed Pepcid 20 mg to be taken at night - Recommend trial of reflux gourmet after meals   Chronic Gastroesophageal Reflux Disease (GERD) Worsened reflux symptoms, especially after gastric band surgery. Currently on Prilosec 40 mg in the evenings. Symptoms include severe reflux attacks and hoarseness. Discussed the potential link between gastric band surgery and increased reflux. Advised consulting with the gastric band surgeon to evaluate this relationship. - Instructed to take Prilosec 40 mg 30 minutes before breakfast and Pepcid 20 mg QHS - Prescribed Pepcid 20 mg to be taken at night - Recommend trial of reflux gourmet after meals - Advise to consult with gastric band surgeon regarding reflux symptoms   PATIENT REPORTED OUTCOME MEASURES (PROM): V-RQOL: 70, indicating "fair" QOL due to voice sx, and VHI: 68, indicating moderate to severe handicap.                                                                                                                            TREATMENT DATE:  PhoRTE-Phonatory Resistance Training Exercises, Abdominal breathing=AB, Vocal function exercises= VFE  07/10/23: PhoRTE exercises reviewed with pt.  Pt doing very well but not getting high enough with high-pitched "calling over the fence" pitch requiring SLP usual min cues faded to rare min A, and not getting low enough pitch with sentences, req'd SLP usual mod A faded to rare min A for low pitch. "Think of [place]" was a good cue. SLP introduced vocal function exercises to pt. Again, pt req'd initial mod A for high to low pitch to get lower pitch faded to independent. Pt will use guitar or pitch pipe to get notes necessary.   07/01/23: SLP reviewed goals with pt. Pt agreed that goals are appropriate. SLP assessed pt's use of AB at rest, in order to use for PhoRTE exercises. Pt 90%+ at rest. SLP worked with pt with PhoRTE exercises and pt req'd  model for exercises initially for each exercise, faded to independent. SLP to show pt vocal function exercises next session. Pt working on increasing H2O consumption to >70oz/day.  06/25/23 (eval): SLP taught pt two/three PhoRTE exercises with initial model and consistent mod A faded to modified independent by task end. SLP provided SLP's role in voice therapy and rationale for PhoRTE. Further, SLP explained rationale for incr'd daily water intake.  PATIENT EDUCATION: Education details: see "treatment note" above Person educated: Patient Education method: Explanation, Demonstration, Verbal cues, and Handouts Education comprehension: verbalized understanding, returned demonstration, verbal cues required, and needs further education  HOME EXERCISE PROGRAM: PhoRTE and VFE, BID  GOALS: Goals reviewed with patient? Yes, in general  SHORT TERM GOALS: Target date: 07/26/23  Pt will complete PhoRTE exercises with occasional min A in 3 sessions Baseline: Goal status: INITIAL  2.  Pt will incr water consumption to >70 oz for 5/7 days over two weeks Baseline:  Goal status: INITIAL  3.  Pt will report improvement (subjective) in his voice quality between 2 sessions  Baseline:  Goal status:  INITIAL  4.  Pt will report better vocal stamina between 2 sessions Baseline:  Goal status: INITIAL  5.  Pt will demo AB 90% in 10 minutes simple conversation in two sessions Baseline:  Goal status: INITIAL  LONG TERM GOALS: Target date: 08/24/23  Pt will improve PROMs compared to initial administration Baseline:  Goal status: INITIAL  2.  Pt s/z ratio will improve to <1.1 after 08/12/23 Baseline:  Goal status: INITIAL  3.  Pt will complete PhoRTE exercises with modified independence between 2 sessions Baseline:  Goal status: INITIAL  4.  Pt will demo AB 90% in 10 minutes mod complex conversation in two sessions Baseline:  Goal status: INITIAL  ASSESSMENT:  CLINICAL IMPRESSION: Patient is a 73 y.o. M who was seen today for treatment of voice in light of vocal fold atrophy dx'd earlier this month. Currently, pt experiences decr'd vocal stamina, and states his wife and family are "always" asking him to repeat due to voice being too soft. Pt is currently performing VFE and PhoRTE exercises to strengthen vocal fold musculature and on AB to maximize breath support for speech.   OBJECTIVE IMPAIRMENTS: include voice disorder. These impairments are limiting patient from managing medications, managing appointments, managing finances, household responsibilities, ADLs/IADLs, and effectively communicating at home and in community. Factors affecting potential to achieve goals and functional outcome are  none noted today .Marland Kitchen Patient will benefit from skilled SLP services to address above impairments and improve overall function.  REHAB POTENTIAL: Excellent  PLAN:  SLP FREQUENCY: 2x/week  SLP DURATION: 8 weeks  PLANNED INTERVENTIONS: Cueing hierachy, Internal/external aids, Oral motor exercises, Functional tasks, SLP instruction and feedback, Compensatory strategies, Patient/family education, and 16[address] Treatment of speech (30 or 45 min)     Chalene Treu, CCC-SLP 07/10/2023, 5:11  PM

## 2023-07-11 ENCOUNTER — Other Ambulatory Visit (HOSPITAL_COMMUNITY): Payer: Self-pay

## 2023-07-11 ENCOUNTER — Other Ambulatory Visit: Payer: Self-pay

## 2023-07-12 ENCOUNTER — Ambulatory Visit: Payer: Medicare Other

## 2023-07-12 ENCOUNTER — Other Ambulatory Visit (HOSPITAL_COMMUNITY): Payer: Self-pay

## 2023-07-12 DIAGNOSIS — R490 Dysphonia: Secondary | ICD-10-CM | POA: Diagnosis not present

## 2023-07-12 NOTE — Therapy (Signed)
 OUTPATIENT SPEECH LANGUAGE PATHOLOGY VOICE TREATMENT   Patient Name: William Booth MRN: [id number] DOB:05-11-1950, 73 y.o., male Today's Date: 07/12/2023  PCP: Peri Maris, FNP REFERRING PROVIDER: Ashok Croon, MD  END OF SESSION:  End of Session - 07/12/23 0837     Visit Number 4    Number of Visits 17    Date for SLP Re-Evaluation 08/24/23    Authorization Type [employer]    SLP Start Time 0803    SLP Stop Time  0834    SLP Time Calculation (min) 31 min    Activity Tolerance Patient tolerated treatment well               Past Medical History:  Diagnosis Date   Anxiety    Diabetes mellitus    type 2   GERD (gastroesophageal reflux disease)    Hypertension    Hypothyroidism    pt states no thyroid problems   Low HDL (under 40)    Narcolepsy    Peripheral neuropathy    not taking any meds at this time   Sleep apnea    uses CPAP, last sleep study 1999, had titration 11/2011   Type II diabetes mellitus with stage 2 chronic kidney disease (HCC) 03/19/2016   Umbilical hernia    Past Surgical History:  Procedure Laterality Date   broken finger     right hand   CARDIAC CATHETERIZATION  06/2009   CLOSED REDUCTION FINGER WITH PERCUTANEOUS PINNING Left 09/30/2013   Procedure: LEFT SMALL FINGER METACARPAL CLOSED REDUCTION WITH PERCUTANEOUS PINNING;  Surgeon: Sharma Covert, MD;  Location: [facility] OR;  Service: Orthopedics;  Laterality: Left;   COLONOSCOPY     CYSTOSCOPY W/ URETERAL STENT PLACEMENT Left 05/27/2017   Procedure: CYSTOSCOPY WITH RETROGRADE PYELOGRAM/URETERAL STENT PLACEMENT;  Surgeon: Rene Paci, MD;  Location: [facility];  Service: Urology;  Laterality: Left;  ONLY NEEDS 15 MIN   CYSTOSCOPY/URETEROSCOPY/HOLMIUM LASER/STENT PLACEMENT Left 06/07/2017   Procedure: CYSTOSCOPY/URETEROSCOPY/HOLMIUM LASER/STENT PLACEMENT;  Surgeon: Rene Paci, MD;  Location: [facility];  Service: Urology;  Laterality:  Left;  ONLY NEEDS 30 MIN FOR PROCEDURE   GASTRIC RESTRICTION SURGERY  02/18/06   lap band   TOOTH EXTRACTION  02/28/12   UMBILICAL HERNIA REPAIR  03/20/2012   Procedure: LAPAROSCOPIC UMBILICAL HERNIA;  Surgeon: Kandis Cocking, MD;  Location[facility];  Service: General;  Laterality: N/A;   Patient Active Problem List   Diagnosis Date Noted   Hypersomnia 03/31/2021   Sepsis due to pneumonia (HCC) 03/19/2016   Type II diabetes mellitus with stage 2 chronic kidney disease (HCC) 03/19/2016   Essential hypertension 03/19/2016   Aspiration pneumonia of left lower lobe (HCC) 03/19/2016   Hypothyroidism 03/19/2016   Obesity 04/13/2015   History of laparoscopic adjustable gastric banding, 02/18/2006. with truncal vagotomy 02/21/2012   Umbilical hernia 02/21/2012   Obstructive sleep apnea 12/08/2011    Onset date: Prior to October 2024  REFERRING DIAG: J38.3 (ICD-10-CM) - Age-related vocal fold atrophy J38.3 (ICD-10-CM) - Glottic insufficiency R49.0 (ICD-10-CM) - Dysphonia  THERAPY DIAG:  Hoarseness  Rationale for Evaluation and Treatment: Rehabilitation  SUBJECTIVE:   SUBJECTIVE STATEMENT: "I think I may have overdone it yesterday." (Pt enters with mod hoarse voice)  Pt accompanied by: self  PERTINENT HISTORY: OSA  PAIN:  Are you having pain? No  FALLS: Has patient fallen in last 6 months? No  LIVING ENVIRONMENT: Lives with: lives with their spouse Lives in: House/apartment  PLOF:Level of  assistance: Independent with ADLs, Independent with IADLs Employment: Retired  PATIENT GOALS: Improve voice quality  OBJECTIVE:  Note: Objective measures were completed at Evaluation unless otherwise noted.  DIAGNOSTIC FINDINGS:  [facility] 06/10/23: Assessment and Plan Voice Changes/Dysphonia - exam with VF atrophy and  glottic insufficiency Voice changes for 6-7 months, worsening with prolonged talking. Examination revealed thin VF with glottic insufficiency, likely due to vocal  cord atrophy. No evidence of tumor or paralysis. Possible contributing factors include postnasal drainage and reflux changes, both noted on exam today. Discussed treatment options including optimizing reflux control, managing postnasal drainage, and voice therapy. If voice therapy fails, procedural interventions such as injection augmentation or medialization thyroplasty procedures may be considered. Emphasized that these procedures are not first-line treatments. Patient expressed understanding and willingness to try voice therapy first. - Refer to neuro rehab center for voice therapy - Prescribe Flonase nasal spray 2 puffs b/l nares twice a day - Recommend ocean spray TID  - Prescribe Zyrtec at night 10 mg daily - Instructed to take Prilosec 40 mg 30 minutes before breakfast and Pepcid 20 mg QHS - Prescribed Pepcid 20 mg to be taken at night - Recommend trial of reflux gourmet after meals   Chronic Gastroesophageal Reflux Disease (GERD) Worsened reflux symptoms, especially after gastric band surgery. Currently on Prilosec 40 mg in the evenings. Symptoms include severe reflux attacks and hoarseness. Discussed the potential link between gastric band surgery and increased reflux. Advised consulting with the gastric band surgeon to evaluate this relationship. - Instructed to take Prilosec 40 mg 30 minutes before breakfast and Pepcid 20 mg QHS - Prescribed Pepcid 20 mg to be taken at night - Recommend trial of reflux gourmet after meals - Advise to consult with gastric band surgeon regarding reflux symptoms   PATIENT REPORTED OUTCOME MEASURES (PROM): V-RQOL: 70, indicating "fair" QOL due to voice sx, and VHI: 68, indicating moderate to severe handicap.                                                                                                                            TREATMENT DATE:  PhoRTE-Phonatory Resistance Training Exercises, Abdominal breathing=AB, Vocal function exercises=  VFE  07/12/23: As pt's voice is mod hoarse today, SLP educated pt about vocal hygiene measures (see "pt instructions"), pointing out specifically that the combination of pt performing PhoRTE, VFE, and impact of allergens as pt was outside all day yesterday likely contributed to his decr'd voice quality this morning. SLP suggested pt not perform voice exercises >once today and pt/SLP agreed pt should perform these later in the afternoon or in the evening. Pt cont to have xerostomia even with 70oz H2O and SLP suggested incr'ing H2O intake to closer to 80 oz and report back to SLP if this decr frequency of xerostomia.   07/10/23: PhoRTE exercises reviewed with pt. Pt doing very well but not getting high enough with high-pitched "calling over the fence" pitch requiring SLP usual min cues  faded to rare min A, and not getting low enough pitch with sentences, req'd SLP usual mod A faded to rare min A for low pitch. "Think of [place]" was a good cue. SLP introduced vocal function exercises to pt. Again, pt req'd initial mod A for high to low pitch to get lower pitch faded to independent. Pt will use guitar or pitch pipe to get notes necessary.   07/01/23: SLP reviewed goals with pt. Pt agreed that goals are appropriate. SLP assessed pt's use of AB at rest, in order to use for PhoRTE exercises. Pt 90%+ at rest. SLP worked with pt with PhoRTE exercises and pt req'd model for exercises initially for each exercise, faded to independent. SLP to show pt vocal function exercises next session. Pt working on increasing H2O consumption to >70oz/day.  06/25/23 (eval): SLP taught pt two/three PhoRTE exercises with initial model and consistent mod A faded to modified independent by task end. SLP provided SLP's role in voice therapy and rationale for PhoRTE. Further, SLP explained rationale for incr'd daily water intake.  PATIENT EDUCATION: Education details: see "treatment note" above Person educated:  Patient Education method: Explanation, Demonstration, Verbal cues, and Handouts Education comprehension: verbalized understanding, returned demonstration, verbal cues required, and needs further education  HOME EXERCISE PROGRAM: PhoRTE and VFE, BID  GOALS: Goals reviewed with patient? Yes, in general  SHORT TERM GOALS: Target date: 07/26/23  Pt will complete PhoRTE exercises with occasional min A in 3 sessions Baseline: Goal status: INITIAL  2.  Pt will incr water consumption to >70 oz for 5/7 days over two weeks Baseline:  Goal status: INITIAL  3.  Pt will report improvement (subjective) in his voice quality between 2 sessions  Baseline:  Goal status: INITIAL  4.  Pt will report better vocal stamina between 2 sessions Baseline:  Goal status: INITIAL  5.  Pt will demo AB 90% in 10 minutes simple conversation in two sessions Baseline:  Goal status: INITIAL  LONG TERM GOALS: Target date: 08/24/23  Pt will improve PROMs compared to initial administration Baseline:  Goal status: INITIAL  2.  Pt s/z ratio will improve to <1.1 after 08/12/23 Baseline:  Goal status: INITIAL  3.  Pt will complete PhoRTE exercises with modified independence between 2 sessions Baseline:  Goal status: INITIAL  4.  Pt will demo AB 90% in 10 minutes mod complex conversation in two sessions Baseline:  Goal status: INITIAL  ASSESSMENT:  CLINICAL IMPRESSION: Patient is a 73 y.o. M who was seen today for treatment of voice in light of vocal fold atrophy dx'd earlier this month. See "treatment date" for details on today's session. He had decr'd voice quality today likely due to outside all day with allergens and having performed voice exercises while outside. Currently, pt experiences decr'd vocal stamina, and states his wife and family are "always" asking him to repeat due to voice being too soft. Pt is currently performing VFE and PhoRTE exercises to strengthen vocal fold musculature and on AB to  maximize breath support for speech.   OBJECTIVE IMPAIRMENTS: include voice disorder. These impairments are limiting patient from managing medications, managing appointments, managing finances, household responsibilities, ADLs/IADLs, and effectively communicating at home and in community. Factors affecting potential to achieve goals and functional outcome are  none noted today .Marland Kitchen Patient will benefit from skilled SLP services to address above impairments and improve overall function.  REHAB POTENTIAL: Excellent  PLAN:  SLP FREQUENCY: 2x/week  SLP DURATION: 8 weeks  PLANNED INTERVENTIONS: Cueing hierachy, Internal/external aids, Oral motor exercises, Functional tasks, SLP instruction and feedback, Compensatory strategies, Patient/family education, and [id number] Treatment of speech (30 or 45 min)     Maurice Fotheringham, CCC-SLP 07/12/2023, 8:38 AM

## 2023-07-12 NOTE — Patient Instructions (Signed)
VOICE CONSERVATION PROGRAM  1. Avoid overuse of voice or excessive use of the voice The vocal cords can become easily fatigued. Try to sort out what is "necessary" versus "unnecessary" talking in your environment. You do not need to STOP talking, but try to limit it as much as possible.  Think of resting your voice just as long as you talk. For example, if you talk for 5 minutes, rest your voice completely for 5 minutes. If your voice feels "tired" during the day, try to rest it as much as possible.  Avoid using an excessively loud voice or shouting/raising your voice When you yell or raise your voice, the vocal cords slam into each other, much like a strong hand clap. This causes irritation, and if this irritation continues, hoarseness may increase. If people in your home talk loudly, ask them to reduce the volume of their voices to help you decrease your volume as well. Sit near or face the person to whom you are speaking.   3. Avoid talking over background noise When talking in background noise, speech automatically has increased loudness, and as in number 2 above, continued loud speech can result in increased hoarseness due to irritation to the vocal cords.  Do not talk over the radio or the TV. Mute them before speaking, go to a quieter place to talk, or sit next to the person with whom you are watching.  4. Talk in a voice that is soft, smooth, and gentle This allows the vocal cords to come together in a gentle way and allows the air being exhaled from the lungs to do most/all of the work when you are speaking.  5. Avoid excessive throat clearing, coughing, and loud laughter During these activities the vocal cords can also be slammed together in a hard way and foster irritation or swelling, which can cause hoarseness. Try taking sips of room temperature/cool water with hard swallows to clear secretions from the throat, instead of throat clearing or coughing. If you absolutely must  clear your throat, do so as gently as possible. If you find yourself clearing your throat or coughing a lot, consult your physician. You will want to laugh. Continue to do so, but softly and gently. Do not laugh loudly for long periods of time because this can increase the chances for vocal fold irritation and thus hoarseness. Lozenges can help reduce the need to clear throat/cough during cold/allergy season.  6. Keep the mouth and throat lubricated Drink at least 8-10 8 oz. glasses of water per day (64-80 oz.). This water can come in the form of drink mixes.  Caffeinated beverages such as colas, coffee, and tea (hot tea AND sweet tea) dry out the vocal cords and then can cause irritation and thus hoarseness.  Drinking water will keep your body hydrated. This will help to decrease secretions in the throat, which cause people to clear their throats or cough. If you are exercising outside (especially during drier weather), try to breathe through your nose as much as possible. If this is not possible, lifting the tongue up behind the upper teeth when breathing through the mouth will add some moisture to the air breathed in past the vocal cords.  7. Avoid mouth breathing - breathe through your nose Your nose serves as a natural filter for dust and dirt particles from the air, and as a humidifier to moisten the air. Vocal cords like to work in moistened, filtered air. When you breathe through your mouth you lose   the air filtering and moistening benefits of breathing through the nose. Be aware of breathing patterns when sitting quietly (e.g., reading or watching TV). Increase your awareness and try to change habits from mouth breathing to nose breathing during those times.  8. Avoid environmental and/or ingested irritants Try to avoid smoke-filled and or dusty environments. These items dry out the vocal cords and cause irritation.  9. Use an air filter if the home is dusty, and/or a humidifier if the air  is dry  This will help to maintain clean, humid air to breathe. Remember, this type of air is what the vocal cords like best.  

## 2023-07-15 ENCOUNTER — Ambulatory Visit: Payer: Medicare Other

## 2023-07-15 DIAGNOSIS — R490 Dysphonia: Secondary | ICD-10-CM

## 2023-07-15 NOTE — Therapy (Signed)
 OUTPATIENT SPEECH LANGUAGE PATHOLOGY VOICE TREATMENT   Patient Name: William Booth MRN: [id number] DOB:1950/11/05, 73 y.o., male Today's Date: 07/15/2023  PCP: Peri Maris, FNP REFERRING PROVIDER: Ashok Croon, MD  END OF SESSION:      Past Medical History:  Diagnosis Date   Anxiety    Diabetes mellitus    type 2   GERD (gastroesophageal reflux disease)    Hypertension    Hypothyroidism    pt states no thyroid problems   Low HDL (under 40)    Narcolepsy    Peripheral neuropathy    not taking any meds at this time   Sleep apnea    uses CPAP, last sleep study 1999, had titration 11/2011   Type II diabetes mellitus with stage 2 chronic kidney disease (HCC) 03/19/2016   Umbilical hernia    Past Surgical History:  Procedure Laterality Date   broken finger     right hand   CARDIAC CATHETERIZATION  06/2009   CLOSED REDUCTION FINGER WITH PERCUTANEOUS PINNING Left 09/30/2013   Procedure: LEFT SMALL FINGER METACARPAL CLOSED REDUCTION WITH PERCUTANEOUS PINNING;  Surgeon: Sharma Covert, MD;  Location: [facility] OR;  Service: Orthopedics;  Laterality: Left;   COLONOSCOPY     CYSTOSCOPY W/ URETERAL STENT PLACEMENT Left 05/27/2017   Procedure: CYSTOSCOPY WITH RETROGRADE PYELOGRAM/URETERAL STENT PLACEMENT;  Surgeon: Rene Paci, MD;  Location: [facility];  Service: Urology;  Laterality: Left;  ONLY NEEDS 15 MIN   CYSTOSCOPY/URETEROSCOPY/HOLMIUM LASER/STENT PLACEMENT Left 06/07/2017   Procedure: CYSTOSCOPY/URETEROSCOPY/HOLMIUM LASER/STENT PLACEMENT;  Surgeon: Rene Paci, MD;  Location: [facility];  Service: Urology;  Laterality: Left;  ONLY NEEDS 30 MIN FOR PROCEDURE   GASTRIC RESTRICTION SURGERY  02/18/06   lap band   TOOTH EXTRACTION  02/28/12   UMBILICAL HERNIA REPAIR  03/20/2012   Procedure: LAPAROSCOPIC UMBILICAL HERNIA;  Surgeon: Kandis Cocking, MD;  Location: [facility] OR;  Service: General;  Laterality: N/A;   Patient Active  Problem List   Diagnosis Date Noted   Hypersomnia 03/31/2021   Sepsis due to pneumonia (HCC) 03/19/2016   Type II diabetes mellitus with stage 2 chronic kidney disease (HCC) 03/19/2016   Essential hypertension 03/19/2016   Aspiration pneumonia of left lower lobe (HCC) 03/19/2016   Hypothyroidism 03/19/2016   Obesity 04/13/2015   History of laparoscopic adjustable gastric banding, 02/18/2006. with truncal vagotomy 02/21/2012   Umbilical hernia 02/21/2012   Obstructive sleep apnea 12/08/2011    Onset date: Prior to October 2024  REFERRING DIAG: J38.3 (ICD-10-CM) - Age-related vocal fold atrophy J38.3 (ICD-10-CM) - Glottic insufficiency R49.0 (ICD-10-CM) - Dysphonia  THERAPY DIAG:  Hoarseness  Rationale for Evaluation and Treatment: Rehabilitation  SUBJECTIVE:   SUBJECTIVE STATEMENT: "I think I may have overdone it yesterday." (Pt enters with mod hoarse voice)  Pt accompanied by: self  PERTINENT HISTORY: OSA  PAIN:  Are you having pain? No  FALLS: Has patient fallen in last 6 months? No  LIVING ENVIRONMENT: Lives with: lives with their spouse Lives in: House/apartment  PLOF:Level of assistance: Independent with ADLs, Independent with IADLs Employment: Retired  PATIENT GOALS: Improve voice quality  OBJECTIVE:  Note: Objective measures were completed at Evaluation unless otherwise noted.  DIAGNOSTIC FINDINGS:  [facility] 06/10/23: Assessment and Plan Voice Changes/Dysphonia - exam with VF atrophy and  glottic insufficiency Voice changes for 6-7 months, worsening with prolonged talking. Examination revealed thin VF with glottic insufficiency, likely due to vocal cord atrophy. No evidence of tumor or paralysis. Possible contributing  factors include postnasal drainage and reflux changes, both noted on exam today. Discussed treatment options including optimizing reflux control, managing postnasal drainage, and voice therapy. If voice therapy fails, procedural  interventions such as injection augmentation or medialization thyroplasty procedures may be considered. Emphasized that these procedures are not first-line treatments. Patient expressed understanding and willingness to try voice therapy first. - Refer to neuro rehab center for voice therapy - Prescribe Flonase nasal spray 2 puffs b/l nares twice a day - Recommend ocean spray TID  - Prescribe Zyrtec at night 10 mg daily - Instructed to take Prilosec 40 mg 30 minutes before breakfast and Pepcid 20 mg QHS - Prescribed Pepcid 20 mg to be taken at night - Recommend trial of reflux gourmet after meals   Chronic Gastroesophageal Reflux Disease (GERD) Worsened reflux symptoms, especially after gastric band surgery. Currently on Prilosec 40 mg in the evenings. Symptoms include severe reflux attacks and hoarseness. Discussed the potential link between gastric band surgery and increased reflux. Advised consulting with the gastric band surgeon to evaluate this relationship. - Instructed to take Prilosec 40 mg 30 minutes before breakfast and Pepcid 20 mg QHS - Prescribed Pepcid 20 mg to be taken at night - Recommend trial of reflux gourmet after meals - Advise to consult with gastric band surgeon regarding reflux symptoms   PATIENT REPORTED OUTCOME MEASURES (PROM): V-RQOL: 70, indicating "fair" QOL due to voice sx, and VHI: 68, indicating moderate to severe handicap.                                                                                                                            TREATMENT DATE:  PhoRTE-Phonatory Resistance Training Exercises, Abdominal breathing=AB, Vocal function exercises= VFE  07/15/23: Pt has had difficulty getting to 80 oz H2O, suspects he is above 70 oz at this time. Pt's quality of voice is noticeably better today than at previous appointments. Average /a/ was 8.8 seconds. PhoRTE completed with usual cues with numbers for lower voice start, and with sentences with lower  authoritative voice. With VFE, pt needed occasional cues to "round back of throat" and purse lips. Started with higher /i/ pitch and then quickly glided down to target pitch.  07/12/23: As pt's voice is mod hoarse today, SLP educated pt about vocal hygiene measures (see "pt instructions"), pointing out specifically that the combination of pt performing PhoRTE, VFE, and impact of allergens as pt was outside all day yesterday likely contributed to his decr'd voice quality this morning. SLP suggested pt not perform voice exercises >once today and pt/SLP agreed pt should perform these later in the afternoon or in the evening. Pt cont to have xerostomia even with 70oz H2O and SLP suggested incr'ing H2O intake to closer to 80 oz and report back to SLP if this decr frequency of xerostomia.   07/10/23: PhoRTE exercises reviewed with pt. Pt doing very well but not getting high enough with high-pitched "calling over the fence" pitch requiring  SLP usual min cues faded to rare min A, and not getting low enough pitch with sentences, req'd SLP usual mod A faded to rare min A for low pitch. "Think of [place]" was a good cue. SLP introduced vocal function exercises to pt. Again, pt req'd initial mod A for high to low pitch to get lower pitch faded to independent. Pt will use guitar or pitch pipe to get notes necessary.   07/01/23: SLP reviewed goals with pt. Pt agreed that goals are appropriate. SLP assessed pt's use of AB at rest, in order to use for PhoRTE exercises. Pt 90%+ at rest. SLP worked with pt with PhoRTE exercises and pt req'd model for exercises initially for each exercise, faded to independent. SLP to show pt vocal function exercises next session. Pt working on increasing H2O consumption to >70oz/day.  06/25/23 (eval): SLP taught pt two/three PhoRTE exercises with initial model and consistent mod A faded to modified independent by task end. SLP provided SLP's role in voice therapy and rationale for  PhoRTE. Further, SLP explained rationale for incr'd daily water intake.  PATIENT EDUCATION: Education details: see "treatment note" above Person educated: Patient Education method: Explanation, Demonstration, Verbal cues, and Handouts Education comprehension: verbalized understanding, returned demonstration, verbal cues required, and needs further education  HOME EXERCISE PROGRAM: PhoRTE and VFE, BID  GOALS: Goals reviewed with patient? Yes, in general  SHORT TERM GOALS: Target date: 07/26/23  Pt will complete PhoRTE exercises with occasional min A in 3 sessions Baseline: Goal status: INITIAL  2.  Pt will incr water consumption to >70 oz for 5/7 days over two weeks Baseline: one week 07/15/23 Goal status: INITIAL  3.  Pt will report improvement (subjective) in his voice quality between 2 sessions  Baseline: 07/15/23 Goal status: INITIAL  4.  Pt will report better vocal stamina between 2 sessions Baseline:  Goal status: INITIAL  5.  Pt will demo AB 90% in 10 minutes simple conversation in two sessions Baseline:  Goal status: INITIAL  LONG TERM GOALS: Target date: 08/24/23  Pt will improve PROMs compared to initial administration Baseline:  Goal status: INITIAL  2.  Pt s/z ratio will improve to <1.1 after 08/12/23 Baseline:  Goal status: INITIAL  3.  Pt will complete PhoRTE exercises with modified independence between 2 sessions Baseline:  Goal status: INITIAL  4.  Pt will demo AB 90% in 10 minutes mod complex conversation in two sessions Baseline:  Goal status: INITIAL  ASSESSMENT:  CLINICAL IMPRESSION: Patient is a 73 y.o. M who was seen today for treatment of voice in light of vocal fold atrophy dx'd earlier this month. See "treatment date" for details on today's session. He had improved voice quality today compared to previous appointments. Currently, pt experiences decr'd vocal stamina, and states his wife and family are "always" asking him to repeat due to  voice being too soft. Pt is currently performing VFE and PhoRTE exercises to strengthen vocal fold musculature and on AB to maximize breath support for speech.   OBJECTIVE IMPAIRMENTS: include voice disorder. These impairments are limiting patient from managing medications, managing appointments, managing finances, household responsibilities, ADLs/IADLs, and effectively communicating at home and in community. Factors affecting potential to achieve goals and functional outcome are  none noted today .Marland Kitchen Patient will benefit from skilled SLP services to address above impairments and improve overall function.  REHAB POTENTIAL: Excellent  PLAN:  SLP FREQUENCY: 2x/week  SLP DURATION: 8 weeks  PLANNED INTERVENTIONS: Cueing hierachy, Internal/external  aids, Oral motor exercises, Functional tasks, SLP instruction and feedback, Compensatory strategies, Patient/family education, and 24401 Treatment of speech (30 or 45 min)     Ailanie Ruttan, CCC-SLP 07/15/2023, 4:28 PM

## 2023-07-17 ENCOUNTER — Ambulatory Visit: Payer: Medicare Other

## 2023-07-17 DIAGNOSIS — R490 Dysphonia: Secondary | ICD-10-CM

## 2023-07-17 NOTE — Therapy (Signed)
 OUTPATIENT SPEECH LANGUAGE PATHOLOGY VOICE TREATMENT   Patient Name: William Booth MRN: [id number] DOB:July 22, 1950, 73 y.o., male Today's Date: 07/17/2023  PCP: Peri Maris, FNP REFERRING PROVIDER: Ashok Croon, MD  END OF SESSION:  End of Session - 07/17/23 1534     Visit Number 6    Number of Visits 17    Date for SLP Re-Evaluation 08/24/23    Authorization Type [employer]    SLP Start Time 1535    SLP Stop Time  1610    SLP Time Calculation (min) 35 min    Activity Tolerance Patient tolerated treatment well                Past Medical History:  Diagnosis Date   Anxiety    Diabetes mellitus    type 2   GERD (gastroesophageal reflux disease)    Hypertension    Hypothyroidism    pt states no thyroid problems   Low HDL (under 40)    Narcolepsy    Peripheral neuropathy    not taking any meds at this time   Sleep apnea    uses CPAP, last sleep study 1999, had titration 11/2011   Type II diabetes mellitus with stage 2 chronic kidney disease (HCC) 03/19/2016   Umbilical hernia    Past Surgical History:  Procedure Laterality Date   broken finger     right hand   CARDIAC CATHETERIZATION  06/2009   CLOSED REDUCTION FINGER WITH PERCUTANEOUS PINNING Left 09/30/2013   Procedure: LEFT SMALL FINGER METACARPAL CLOSED REDUCTION WITH PERCUTANEOUS PINNING;  Surgeon: Sharma Covert, MD;  Location: [facility] OR;  Service: Orthopedics;  Laterality: Left;   COLONOSCOPY     CYSTOSCOPY W/ URETERAL STENT PLACEMENT Left 05/27/2017   Procedure: CYSTOSCOPY WITH RETROGRADE PYELOGRAM/URETERAL STENT PLACEMENT;  Surgeon: Rene Paci, MD;  Location: [facility];  Service: Urology;  Laterality: Left;  ONLY NEEDS 15 MIN   CYSTOSCOPY/URETEROSCOPY/HOLMIUM LASER/STENT PLACEMENT Left 06/07/2017   Procedure: CYSTOSCOPY/URETEROSCOPY/HOLMIUM LASER/STENT PLACEMENT;  Surgeon: Rene Paci, MD;  Location: [facility];  Service: Urology;  Laterality:  Left;  ONLY NEEDS 30 MIN FOR PROCEDURE   GASTRIC RESTRICTION SURGERY  02/18/06   lap band   TOOTH EXTRACTION  02/28/12   UMBILICAL HERNIA REPAIR  03/20/2012   Procedure: LAPAROSCOPIC UMBILICAL HERNIA;  Surgeon: Kandis Cocking, MD;  Location: [facility] OR;  Service: General;  Laterality: N/A;   Patient Active Problem List   Diagnosis Date Noted   Hypersomnia 03/31/2021   Sepsis due to pneumonia (HCC) 03/19/2016   Type II diabetes mellitus with stage 2 chronic kidney disease (HCC) 03/19/2016   Essential hypertension 03/19/2016   Aspiration pneumonia of left lower lobe (HCC) 03/19/2016   Hypothyroidism 03/19/2016   Obesity 04/13/2015   History of laparoscopic adjustable gastric banding, 02/18/2006. with truncal vagotomy 02/21/2012   Umbilical hernia 02/21/2012   Obstructive sleep apnea 12/08/2011    Onset date: Prior to October 2024  REFERRING DIAG: J38.3 (ICD-10-CM) - Age-related vocal fold atrophy J38.3 (ICD-10-CM) - Glottic insufficiency R49.0 (ICD-10-CM) - Dysphonia  THERAPY DIAG:  Hoarseness  Rationale for Evaluation and Treatment: Rehabilitation  SUBJECTIVE:   SUBJECTIVE STATEMENT: Pt with mod hoarse voice today - has been faithful with exercises last two days.  Pt accompanied by: self  PERTINENT HISTORY: OSA  PAIN:  Are you having pain? No  FALLS: Has patient fallen in last 6 months? No  LIVING ENVIRONMENT: Lives with: lives with their spouse Lives in: House/apartment  PLOF:Level of assistance: Independent with ADLs, Independent with IADLs Employment: Retired  PATIENT GOALS: Improve voice quality  OBJECTIVE:  Note: Objective measures were completed at Evaluation unless otherwise noted.  DIAGNOSTIC FINDINGS:  [facility] 06/10/23: Assessment and Plan Voice Changes/Dysphonia - exam with VF atrophy and  glottic insufficiency Voice changes for 6-7 months, worsening with prolonged talking. Examination revealed thin VF with glottic insufficiency, likely due to  vocal cord atrophy. No evidence of tumor or paralysis. Possible contributing factors include postnasal drainage and reflux changes, both noted on exam today. Discussed treatment options including optimizing reflux control, managing postnasal drainage, and voice therapy. If voice therapy fails, procedural interventions such as injection augmentation or medialization thyroplasty procedures may be considered. Emphasized that these procedures are not first-line treatments. Patient expressed understanding and willingness to try voice therapy first. - Refer to neuro rehab center for voice therapy - Prescribe Flonase nasal spray 2 puffs b/l nares twice a day - Recommend ocean spray TID  - Prescribe Zyrtec at night 10 mg daily - Instructed to take Prilosec 40 mg 30 minutes before breakfast and Pepcid 20 mg QHS - Prescribed Pepcid 20 mg to be taken at night - Recommend trial of reflux gourmet after meals   Chronic Gastroesophageal Reflux Disease (GERD) Worsened reflux symptoms, especially after gastric band surgery. Currently on Prilosec 40 mg in the evenings. Symptoms include severe reflux attacks and hoarseness. Discussed the potential link between gastric band surgery and increased reflux. Advised consulting with the gastric band surgeon to evaluate this relationship. - Instructed to take Prilosec 40 mg 30 minutes before breakfast and Pepcid 20 mg QHS - Prescribed Pepcid 20 mg to be taken at night - Recommend trial of reflux gourmet after meals - Advise to consult with gastric band surgeon regarding reflux symptoms   PATIENT REPORTED OUTCOME MEASURES (PROM): V-RQOL: 70, indicating "fair" QOL due to voice sx, and VHI: 68, indicating moderate to severe handicap.                                                                                                                            TREATMENT DATE:  PhoRTE-Phonatory Resistance Training Exercises, Abdominal breathing=AB, Vocal function exercises=  VFE  07/17/23: Pt completed HEP (PhoRTE and VFE) once today and this will be his second time today. Is maintaining at least 70 oz H2O daily. Rare min A with PhoRTE and with VFE. Pt agreed he is ready to accomplish these regularly on his own until next week. SLP provided some hints for pt to obtain lower pitches with numbers on PhoRTE - glide down from starting pitch then do the number.  07/15/23: Pt has had difficulty getting to 80 oz H2O, suspects he is above 70 oz at this time. Pt's quality of voice is noticeably better today than at previous appointments. Average /a/ was 8.8 seconds. PhoRTE completed with usual cues with numbers for lower voice start, and with sentences with lower authoritative voice. With VFE, pt needed  occasional cues to "round back of throat" and purse lips. Started with higher /i/ pitch and then quickly glided down to target pitch.  07/12/23: As pt's voice is mod hoarse today, SLP educated pt about vocal hygiene measures (see "pt instructions"), pointing out specifically that the combination of pt performing PhoRTE, VFE, and impact of allergens as pt was outside all day yesterday likely contributed to his decr'd voice quality this morning. SLP suggested pt not perform voice exercises >once today and pt/SLP agreed pt should perform these later in the afternoon or in the evening. Pt cont to have xerostomia even with 70oz H2O and SLP suggested incr'ing H2O intake to closer to 80 oz and report back to SLP if this decr frequency of xerostomia.   07/10/23: PhoRTE exercises reviewed with pt. Pt doing very well but not getting high enough with high-pitched "calling over the fence" pitch requiring SLP usual min cues faded to rare min A, and not getting low enough pitch with sentences, req'd SLP usual mod A faded to rare min A for low pitch. "Think of [place]" was a good cue. SLP introduced vocal function exercises to pt. Again, pt req'd initial mod A for high to low pitch to get lower  pitch faded to independent. Pt will use guitar or pitch pipe to get notes necessary.   07/01/23: SLP reviewed goals with pt. Pt agreed that goals are appropriate. SLP assessed pt's use of AB at rest, in order to use for PhoRTE exercises. Pt 90%+ at rest. SLP worked with pt with PhoRTE exercises and pt req'd model for exercises initially for each exercise, faded to independent. SLP to show pt vocal function exercises next session. Pt working on increasing H2O consumption to >70oz/day.  06/25/23 (eval): SLP taught pt two/three PhoRTE exercises with initial model and consistent mod A faded to modified independent by task end. SLP provided SLP's role in voice therapy and rationale for PhoRTE. Further, SLP explained rationale for incr'd daily water intake.  PATIENT EDUCATION: Education details: see "treatment note" above Person educated: Patient Education method: Explanation, Demonstration, Verbal cues, and Handouts Education comprehension: verbalized understanding, returned demonstration, verbal cues required, and needs further education  HOME EXERCISE PROGRAM: PhoRTE and VFE, BID  GOALS: Goals reviewed with patient? Yes, in general  SHORT TERM GOALS: Target date: 07/26/23  Pt will complete PhoRTE exercises with occasional min A in 3 sessions Baseline: 07/17/23 Goal status: INITIAL  2.  Pt will incr water consumption to >70 oz for 5/7 days over two weeks Baseline: one week 07/15/23 Goal status: INITIAL  3.  Pt will report improvement (subjective) in his voice quality between 2 sessions  Baseline: 07/15/23 Goal status: INITIAL  4.  Pt will report better vocal stamina between 2 sessions Baseline:  Goal status: INITIAL  5.  Pt will demo AB 90% in 10 minutes simple conversation in two sessions Baseline:  Goal status: INITIAL  LONG TERM GOALS: Target date: 08/24/23  Pt will improve PROMs compared to initial administration Baseline:  Goal status: INITIAL  2.  Pt s/z ratio will improve  to <1.1 after 08/12/23 Baseline:  Goal status: INITIAL  3.  Pt will complete PhoRTE exercises with modified independence between 2 sessions Baseline:  Goal status: INITIAL  4.  Pt will demo AB 90% in 10 minutes mod complex conversation in two sessions Baseline:  Goal status: INITIAL  ASSESSMENT:  CLINICAL IMPRESSION: Patient is a 73 y.o. M who was seen today for treatment of voice in  light of vocal fold atrophy dx'd earlier this month. See "treatment date" for details on today's session. Currently, pt reported on eval date he experiences decr'd vocal stamina, and states his wife and family are "always" asking him to repeat due to voice being too soft. Pt is currently performing VFE and PhoRTE exercises to strengthen vocal fold musculature and on AB to maximize breath support for speech. SLP and pt agreed he could decr to once/week due to progress with knowing the procedures for prescribed and trained exercises.   OBJECTIVE IMPAIRMENTS: include voice disorder. These impairments are limiting patient from managing medications, managing appointments, managing finances, household responsibilities, ADLs/IADLs, and effectively communicating at home and in community. Factors affecting potential to achieve goals and functional outcome are  none noted today .Marland Kitchen Patient will benefit from skilled SLP services to address above impairments and improve overall function.  REHAB POTENTIAL: Excellent  PLAN:  SLP FREQUENCY: 1x/week  SLP DURATION: 8 weeks  PLANNED INTERVENTIONS: Cueing hierachy, Internal/external aids, Oral motor exercises, Functional tasks, SLP instruction and feedback, Compensatory strategies, Patient/family education, and [id number] Treatment of speech (30 or 45 min)     Danylle Ouk, CCC-SLP 07/17/2023, 4:11 PM

## 2023-07-19 ENCOUNTER — Other Ambulatory Visit (HOSPITAL_COMMUNITY): Payer: Self-pay

## 2023-07-22 ENCOUNTER — Other Ambulatory Visit (HOSPITAL_COMMUNITY): Payer: Self-pay

## 2023-07-23 ENCOUNTER — Other Ambulatory Visit (HOSPITAL_COMMUNITY): Payer: Self-pay

## 2023-07-23 ENCOUNTER — Ambulatory Visit: Payer: Medicare Other

## 2023-07-23 DIAGNOSIS — R490 Dysphonia: Secondary | ICD-10-CM

## 2023-07-23 MED ORDER — ALPRAZOLAM 0.5 MG PO TABS
0.5000 mg | ORAL_TABLET | Freq: Two times a day (BID) | ORAL | 2 refills | Status: AC | PRN
  Filled 2023-07-23: qty 60, 30d supply, fill #0
  Filled 2023-09-28: qty 60, 30d supply, fill #1
  Filled 2023-12-10: qty 60, 30d supply, fill #2

## 2023-07-23 NOTE — Therapy (Signed)
 OUTPATIENT SPEECH LANGUAGE PATHOLOGY VOICE TREATMENT   Patient Name: William Booth MRN: [id number] DOB:1950/10/26, 73 y.o., male Today's Date: 07/23/2023  PCP: Peri Maris, FNP REFERRING PROVIDER: Ashok Croon, MD  END OF SESSION:  End of Session - 07/23/23 1627     Visit Number 7    Number of Visits 17    Date for SLP Re-Evaluation 08/24/23    Authorization Type [employer]    SLP Start Time 1623    SLP Stop Time  1700    SLP Time Calculation (min) 37 min    Activity Tolerance Patient tolerated treatment well                Past Medical History:  Diagnosis Date   Anxiety    Diabetes mellitus    type 2   GERD (gastroesophageal reflux disease)    Hypertension    Hypothyroidism    pt states no thyroid problems   Low HDL (under 40)    Narcolepsy    Peripheral neuropathy    not taking any meds at this time   Sleep apnea    uses CPAP, last sleep study 1999, had titration 11/2011   Type II diabetes mellitus with stage 2 chronic kidney disease (HCC) 03/19/2016   Umbilical hernia    Past Surgical History:  Procedure Laterality Date   broken finger     right hand   CARDIAC CATHETERIZATION  06/2009   CLOSED REDUCTION FINGER WITH PERCUTANEOUS PINNING Left 09/30/2013   Procedure: LEFT SMALL FINGER METACARPAL CLOSED REDUCTION WITH PERCUTANEOUS PINNING;  Surgeon: Sharma Covert, MD;  Location: [facility] OR;  Service: Orthopedics;  Laterality: Left;   COLONOSCOPY     CYSTOSCOPY W/ URETERAL STENT PLACEMENT Left 05/27/2017   Procedure: CYSTOSCOPY WITH RETROGRADE PYELOGRAM/URETERAL STENT PLACEMENT;  Surgeon: Rene Paci, MD;  Location: [facility];  Service: Urology;  Laterality: Left;  ONLY NEEDS 15 MIN   CYSTOSCOPY/URETEROSCOPY/HOLMIUM LASER/STENT PLACEMENT Left 06/07/2017   Procedure: CYSTOSCOPY/URETEROSCOPY/HOLMIUM LASER/STENT PLACEMENT;  Surgeon: Rene Paci, MD;  Location: [facility];  Service: Urology;  Laterality:  Left;  ONLY NEEDS 30 MIN FOR PROCEDURE   GASTRIC RESTRICTION SURGERY  02/18/06   lap band   TOOTH EXTRACTION  02/28/12   UMBILICAL HERNIA REPAIR  03/20/2012   Procedure: LAPAROSCOPIC UMBILICAL HERNIA;  Surgeon: Kandis Cocking, MD;  Location: [facility] OR;  Service: General;  Laterality: N/A;   Patient Active Problem List   Diagnosis Date Noted   Hypersomnia 03/31/2021   Sepsis due to pneumonia (HCC) 03/19/2016   Type II diabetes mellitus with stage 2 chronic kidney disease (HCC) 03/19/2016   Essential hypertension 03/19/2016   Aspiration pneumonia of left lower lobe (HCC) 03/19/2016   Hypothyroidism 03/19/2016   Obesity 04/13/2015   History of laparoscopic adjustable gastric banding, 02/18/2006. with truncal vagotomy 02/21/2012   Umbilical hernia 02/21/2012   Obstructive sleep apnea 12/08/2011    Onset date: Prior to October 2024  REFERRING DIAG: J38.3 (ICD-10-CM) - Age-related vocal fold atrophy J38.3 (ICD-10-CM) - Glottic insufficiency R49.0 (ICD-10-CM) - Dysphonia  THERAPY DIAG:  Hoarseness  Rationale for Evaluation and Treatment: Rehabilitation  SUBJECTIVE:   SUBJECTIVE STATEMENT: Pt's voice self rated 7/10 (highest rated since initiating ST).  Pt accompanied by: self  PERTINENT HISTORY: OSA  PAIN:  Are you having pain? No  FALLS: Has patient fallen in last 6 months? No  LIVING ENVIRONMENT: Lives with: lives with their spouse Lives in: House/apartment  PLOF:Level of assistance: Independent with  ADLs, Independent with IADLs Employment: Retired  PATIENT GOALS: Improve voice quality  OBJECTIVE:  Note: Objective measures were completed at Evaluation unless otherwise noted.  DIAGNOSTIC FINDINGS:  [facility] 06/10/23: Assessment and Plan Voice Changes/Dysphonia - exam with VF atrophy and  glottic insufficiency Voice changes for 6-7 months, worsening with prolonged talking. Examination revealed thin VF with glottic insufficiency, likely due to vocal cord atrophy.  No evidence of tumor or paralysis. Possible contributing factors include postnasal drainage and reflux changes, both noted on exam today. Discussed treatment options including optimizing reflux control, managing postnasal drainage, and voice therapy. If voice therapy fails, procedural interventions such as injection augmentation or medialization thyroplasty procedures may be considered. Emphasized that these procedures are not first-line treatments. Patient expressed understanding and willingness to try voice therapy first. - Refer to neuro rehab center for voice therapy - Prescribe Flonase nasal spray 2 puffs b/l nares twice a day - Recommend ocean spray TID  - Prescribe Zyrtec at night 10 mg daily - Instructed to take Prilosec 40 mg 30 minutes before breakfast and Pepcid 20 mg QHS - Prescribed Pepcid 20 mg to be taken at night - Recommend trial of reflux gourmet after meals   Chronic Gastroesophageal Reflux Disease (GERD) Worsened reflux symptoms, especially after gastric band surgery. Currently on Prilosec 40 mg in the evenings. Symptoms include severe reflux attacks and hoarseness. Discussed the potential link between gastric band surgery and increased reflux. Advised consulting with the gastric band surgeon to evaluate this relationship. - Instructed to take Prilosec 40 mg 30 minutes before breakfast and Pepcid 20 mg QHS - Prescribed Pepcid 20 mg to be taken at night - Recommend trial of reflux gourmet after meals - Advise to consult with gastric band surgeon regarding reflux symptoms   PATIENT REPORTED OUTCOME MEASURES (PROM): V-RQOL: 70, indicating "fair" QOL due to voice sx, and VHI: 68, indicating moderate to severe handicap.                                                                                                                            TREATMENT DATE:  PhoRTE-Phonatory Resistance Training Exercises, Abdominal breathing=AB, Vocal function exercises= VFE  07/23/23: Pt rated  his voice today 7/10, and yesterday was 6/10. "Yes - they seem to be helping" was pt's response when asked if he had been consistent with HEP since last session. Maintaining 60+ oz/day; not as significant xerostomia as in initial part of ST. Today pt completed VFE with rare min A faded to modified indpendent, and PhoRTE with initial cue for higher pitch voice for sentences. Pt is comfortable continuing performing HEPs at home. 10 minutes of conversation and pt maintained AB 90%.  07/17/23: Pt completed HEP (PhoRTE and VFE) once today and this will be his second time today. Is maintaining at least 70 oz H2O daily. Rare min A with PhoRTE and with VFE. Pt agreed he is ready to accomplish these regularly on his own until next week. SLP  provided some hints for pt to obtain lower pitches with numbers on PhoRTE - glide down from starting pitch then do the number.  07/15/23: Pt has had difficulty getting to 80 oz H2O, suspects he is above 70 oz at this time. Pt's quality of voice is noticeably better today than at previous appointments. Average /a/ was 8.8 seconds. PhoRTE completed with usual cues with numbers for lower voice start, and with sentences with lower authoritative voice. With VFE, pt needed occasional cues to "round back of throat" and purse lips. Started with higher /i/ pitch and then quickly glided down to target pitch.  07/12/23: As pt's voice is mod hoarse today, SLP educated pt about vocal hygiene measures (see "pt instructions"), pointing out specifically that the combination of pt performing PhoRTE, VFE, and impact of allergens as pt was outside all day yesterday likely contributed to his decr'd voice quality this morning. SLP suggested pt not perform voice exercises >once today and pt/SLP agreed pt should perform these later in the afternoon or in the evening. Pt cont to have xerostomia even with 70oz H2O and SLP suggested incr'ing H2O intake to closer to 80 oz and report back to SLP if this decr  frequency of xerostomia.   07/10/23: PhoRTE exercises reviewed with pt. Pt doing very well but not getting high enough with high-pitched "calling over the fence" pitch requiring SLP usual min cues faded to rare min A, and not getting low enough pitch with sentences, req'd SLP usual mod A faded to rare min A for low pitch. "Think of [place]" was a good cue. SLP introduced vocal function exercises to pt. Again, pt req'd initial mod A for high to low pitch to get lower pitch faded to independent. Pt will use guitar or pitch pipe to get notes necessary.   07/01/23: SLP reviewed goals with pt. Pt agreed that goals are appropriate. SLP assessed pt's use of AB at rest, in order to use for PhoRTE exercises. Pt 90%+ at rest. SLP worked with pt with PhoRTE exercises and pt req'd model for exercises initially for each exercise, faded to independent. SLP to show pt vocal function exercises next session. Pt working on increasing H2O consumption to >70oz/day.  06/25/23 (eval): SLP taught pt two/three PhoRTE exercises with initial model and consistent mod A faded to modified independent by task end. SLP provided SLP's role in voice therapy and rationale for PhoRTE. Further, SLP explained rationale for incr'd daily water intake.  PATIENT EDUCATION: Education details: see "treatment note" above Person educated: Patient Education method: Explanation, Demonstration, Verbal cues, and Handouts Education comprehension: verbalized understanding, returned demonstration, verbal cues required, and needs further education  HOME EXERCISE PROGRAM: PhoRTE and VFE, BID  GOALS: Goals reviewed with patient? Yes, in general  SHORT TERM GOALS: Target date: 07/26/23  Pt will complete PhoRTE exercises with occasional min A in 3 sessions Baseline: 07/17/23, 07/23/23 Goal status: partially met  2.  Pt will incr water consumption to >70 oz for 5/7 days over two weeks Baseline: one week 07/15/23 Goal status: partially  met  3.  Pt will report improvement (subjective) in his voice quality between 2 sessions  Baseline: 07/15/23, 07/23/23 Goal status: met  4.  Pt will report better vocal stamina between 2 sessions Baseline:  Goal status: not met  5.  Pt will demo AB 90% in 10 minutes simple conversation in two sessions Baseline: 07/23/23 Goal status: partially met  LONG TERM GOALS: Target date: 08/24/23  Pt will improve  PROMs compared to initial administration Baseline:  Goal status: INITIAL  2.  Pt s/z ratio will improve to <1.1 after 08/12/23 Baseline:  Goal status: INITIAL  3.  Pt will complete PhoRTE exercises with modified independence between 2 sessions Baseline:  Goal status: INITIAL  4.  Pt will demo AB 90% in 10 minutes mod complex conversation in two sessions Baseline:  Goal status: INITIAL  ASSESSMENT:  CLINICAL IMPRESSION: Patient is a 73 y.o. M who was seen today for treatment of voice in light of vocal fold atrophy dx'd earlier this month. See "treatment date" for details on today's session. Currently, pt reported on eval date he experiences decr'd vocal stamina, and states his wife and family are "always" asking him to repeat due to voice being too soft. Pt is currently performing VFE and PhoRTE exercises to strengthen vocal fold musculature and on AB to maximize breath support for speech. SLP and pt agreed he could decr to once/week due to progress with knowing the procedures for prescribed and trained exercises.   OBJECTIVE IMPAIRMENTS: include voice disorder. These impairments are limiting patient from managing medications, managing appointments, managing finances, household responsibilities, ADLs/IADLs, and effectively communicating at home and in community. Factors affecting potential to achieve goals and functional outcome are  none noted today .Marland Kitchen Patient will benefit from skilled SLP services to address above impairments and improve overall function.  REHAB POTENTIAL:  Excellent  PLAN:  SLP FREQUENCY: 1x/week  SLP DURATION: 8 weeks  PLANNED INTERVENTIONS: Cueing hierachy, Internal/external aids, Oral motor exercises, Functional tasks, SLP instruction and feedback, Compensatory strategies, Patient/family education, and [address] Treatment of speech (30 or 45 min)     Delaney Schnick, CCC-SLP 07/23/2023, 10:44 PM

## 2023-07-25 ENCOUNTER — Ambulatory Visit: Payer: Medicare Other

## 2023-07-29 ENCOUNTER — Ambulatory Visit: Payer: Medicare Other

## 2023-07-29 DIAGNOSIS — R490 Dysphonia: Secondary | ICD-10-CM | POA: Diagnosis not present

## 2023-07-29 NOTE — Therapy (Signed)
 OUTPATIENT SPEECH LANGUAGE PATHOLOGY VOICE TREATMENT   Patient Name: William Booth MRN: [id number] DOB:1950/11/02, 73 y.o., male Today's Date: 07/29/2023  PCP: Peri Maris, FNP REFERRING PROVIDER: Ashok Croon, MD  END OF SESSION:  End of Session - 07/29/23 1622     Visit Number 8    Number of Visits 17    Date for SLP Re-Evaluation 08/24/23    Authorization Type [employer]    SLP Start Time 1620    SLP Stop Time  1700    SLP Time Calculation (min) 40 min    Activity Tolerance Patient tolerated treatment well                Past Medical History:  Diagnosis Date   Anxiety    Diabetes mellitus    type 2   GERD (gastroesophageal reflux disease)    Hypertension    Hypothyroidism    pt states no thyroid problems   Low HDL (under 40)    Narcolepsy    Peripheral neuropathy    not taking any meds at this time   Sleep apnea    uses CPAP, last sleep study 1999, had titration 11/2011   Type II diabetes mellitus with stage 2 chronic kidney disease (HCC) 03/19/2016   Umbilical hernia    Past Surgical History:  Procedure Laterality Date   broken finger     right hand   CARDIAC CATHETERIZATION  06/2009   CLOSED REDUCTION FINGER WITH PERCUTANEOUS PINNING Left 09/30/2013   Procedure: LEFT SMALL FINGER METACARPAL CLOSED REDUCTION WITH PERCUTANEOUS PINNING;  Surgeon: Sharma Covert, MD;  Location: [facility] OR;  Service: Orthopedics;  Laterality: Left;   COLONOSCOPY     CYSTOSCOPY W/ URETERAL STENT PLACEMENT Left 05/27/2017   Procedure: CYSTOSCOPY WITH RETROGRADE PYELOGRAM/URETERAL STENT PLACEMENT;  Surgeon: Rene Paci, MD;  Location: [facility];  Service: Urology;  Laterality: Left;  ONLY NEEDS 15 MIN   CYSTOSCOPY/URETEROSCOPY/HOLMIUM LASER/STENT PLACEMENT Left 06/07/2017   Procedure: CYSTOSCOPY/URETEROSCOPY/HOLMIUM LASER/STENT PLACEMENT;  Surgeon: Rene Paci, MD;  Location: [facility]  Service: Urology;  Laterality:  Left;  ONLY NEEDS 30 MIN FOR PROCEDURE   GASTRIC RESTRICTION SURGERY  02/18/06   lap band   TOOTH EXTRACTION  02/28/12   UMBILICAL HERNIA REPAIR  03/20/2012   Procedure: LAPAROSCOPIC UMBILICAL HERNIA;  Surgeon: Kandis Cocking, MD;  Location: [facility] OR;  Service: General;  Laterality: N/A;   Patient Active Problem List   Diagnosis Date Noted   Hypersomnia 03/31/2021   Sepsis due to pneumonia (HCC) 03/19/2016   Type II diabetes mellitus with stage 2 chronic kidney disease (HCC) 03/19/2016   Essential hypertension 03/19/2016   Aspiration pneumonia of left lower lobe (HCC) 03/19/2016   Hypothyroidism 03/19/2016   Obesity 04/13/2015   History of laparoscopic adjustable gastric banding, 02/18/2006. with truncal vagotomy 02/21/2012   Umbilical hernia 02/21/2012   Obstructive sleep apnea 12/08/2011    Onset date: Prior to October 2024  REFERRING DIAG: J38.3 (ICD-10-CM) - Age-related vocal fold atrophy J38.3 (ICD-10-CM) - Glottic insufficiency R49.0 (ICD-10-CM) - Dysphonia  THERAPY DIAG:  No diagnosis found.  Rationale for Evaluation and Treatment: Rehabilitation  SUBJECTIVE:   SUBJECTIVE STATEMENT: Pt's voice self rated 7/10 (highest rated since initiating ST).  Pt accompanied by: self  PERTINENT HISTORY: OSA  PAIN:  Are you having pain? No  FALLS: Has patient fallen in last 6 months? No  LIVING ENVIRONMENT: Lives with: lives with their spouse Lives in: House/apartment  PLOF:Level of assistance:  Independent with ADLs, Independent with IADLs Employment: Retired  PATIENT GOALS: Improve voice quality  OBJECTIVE:  Note: Objective measures were completed at Evaluation unless otherwise noted.  DIAGNOSTIC FINDINGS:  [facility] 06/10/23: Assessment and Plan Voice Changes/Dysphonia - exam with VF atrophy and  glottic insufficiency Voice changes for 6-7 months, worsening with prolonged talking. Examination revealed thin VF with glottic insufficiency, likely due to vocal cord  atrophy. No evidence of tumor or paralysis. Possible contributing factors include postnasal drainage and reflux changes, both noted on exam today. Discussed treatment options including optimizing reflux control, managing postnasal drainage, and voice therapy. If voice therapy fails, procedural interventions such as injection augmentation or medialization thyroplasty procedures may be considered. Emphasized that these procedures are not first-line treatments. Patient expressed understanding and willingness to try voice therapy first. - Refer to neuro rehab center for voice therapy - Prescribe Flonase nasal spray 2 puffs b/l nares twice a day - Recommend ocean spray TID  - Prescribe Zyrtec at night 10 mg daily - Instructed to take Prilosec 40 mg 30 minutes before breakfast and Pepcid 20 mg QHS - Prescribed Pepcid 20 mg to be taken at night - Recommend trial of reflux gourmet after meals   Chronic Gastroesophageal Reflux Disease (GERD) Worsened reflux symptoms, especially after gastric band surgery. Currently on Prilosec 40 mg in the evenings. Symptoms include severe reflux attacks and hoarseness. Discussed the potential link between gastric band surgery and increased reflux. Advised consulting with the gastric band surgeon to evaluate this relationship. - Instructed to take Prilosec 40 mg 30 minutes before breakfast and Pepcid 20 mg QHS - Prescribed Pepcid 20 mg to be taken at night - Recommend trial of reflux gourmet after meals - Advise to consult with gastric band surgeon regarding reflux symptoms   PATIENT REPORTED OUTCOME MEASURES (PROM): V-RQOL: 70, indicating "fair" QOL due to voice sx, and VHI: 68, indicating moderate to severe handicap.                                                                                                                            TREATMENT DATE:  PhoRTE-Phonatory Resistance Training Exercises, Abdominal breathing=AB, Vocal function exercises= VFE  07/29/23:  Pt's follow up with referring MD is 08/05/23. SLP encouraged pt to keep a log of voice quality (1-10) and short notes about why he rated the way he did. Educated pt that SLP and he could look for trends and could problem solve how to improve quality based on trends. Pt rated voice today 5/10 . He has not had a rating >7/10 since last session. He worked outside yesterday and Saturday. Today, pt req'd min A for low pitch sentences, and mod A occasionally faded to rarely for rounding oral and labial structures during VFE.   07/23/23: Pt rated his voice today 7/10, and yesterday was 6/10. "Yes - they seem to be helping" was pt's response when asked if he had been consistent with HEP since last session. Maintaining  60+ oz/day; not as significant xerostomia as in initial part of ST. Today pt completed VFE with rare min A faded to modified indpendent, and PhoRTE with initial cue for higher pitch voice for sentences. Pt is comfortable continuing performing HEPs at home. 10 minutes of conversation and pt maintained AB 90%.  07/17/23: Pt completed HEP (PhoRTE and VFE) once today and this will be his second time today. Is maintaining at least 70 oz H2O daily. Rare min A with PhoRTE and with VFE. Pt agreed he is ready to accomplish these regularly on his own until next week. SLP provided some hints for pt to obtain lower pitches with numbers on PhoRTE - glide down from starting pitch then do the number.  07/15/23: Pt has had difficulty getting to 80 oz H2O, suspects he is above 70 oz at this time. Pt's quality of voice is noticeably better today than at previous appointments. Average /a/ was 8.8 seconds. PhoRTE completed with usual cues with numbers for lower voice start, and with sentences with lower authoritative voice. With VFE, pt needed occasional cues to "round back of throat" and purse lips. Started with higher /i/ pitch and then quickly glided down to target pitch.  07/12/23: As pt's voice is mod hoarse today, SLP  educated pt about vocal hygiene measures (see "pt instructions"), pointing out specifically that the combination of pt performing PhoRTE, VFE, and impact of allergens as pt was outside all day yesterday likely contributed to his decr'd voice quality this morning. SLP suggested pt not perform voice exercises >once today and pt/SLP agreed pt should perform these later in the afternoon or in the evening. Pt cont to have xerostomia even with 70oz H2O and SLP suggested incr'ing H2O intake to closer to 80 oz and report back to SLP if this decr frequency of xerostomia.   07/10/23: PhoRTE exercises reviewed with pt. Pt doing very well but not getting high enough with high-pitched "calling over the fence" pitch requiring SLP usual min cues faded to rare min A, and not getting low enough pitch with sentences, req'd SLP usual mod A faded to rare min A for low pitch. "Think of [place] [employer]" was a good cue. SLP introduced vocal function exercises to pt. Again, pt req'd initial mod A for high to low pitch to get lower pitch faded to independent. Pt will use guitar or pitch pipe to get notes necessary.   07/01/23: SLP reviewed goals with pt. Pt agreed that goals are appropriate. SLP assessed pt's use of AB at rest, in order to use for PhoRTE exercises. Pt 90%+ at rest. SLP worked with pt with PhoRTE exercises and pt req'd model for exercises initially for each exercise, faded to independent. SLP to show pt vocal function exercises next session. Pt working on increasing H2O consumption to >70oz/day.  06/25/23 (eval): SLP taught pt two/three PhoRTE exercises with initial model and consistent mod A faded to modified independent by task end. SLP provided SLP's role in voice therapy and rationale for PhoRTE. Further, SLP explained rationale for incr'd daily water intake.  PATIENT EDUCATION: Education details: see "treatment note" above Person educated: Patient Education method: Explanation, Demonstration, Verbal cues, and  Handouts Education comprehension: verbalized understanding, returned demonstration, verbal cues required, and needs further education  HOME EXERCISE PROGRAM: PhoRTE and VFE, BID  GOALS: Goals reviewed with patient? Yes, in general  SHORT TERM GOALS: Target date: 07/26/23  Pt will complete PhoRTE exercises with occasional min A in 3 sessions Baseline: 07/17/23, 07/23/23  Goal status: partially met  2.  Pt will incr water consumption to >70 oz for 5/7 days over two weeks Baseline: one week 07/15/23 Goal status: partially met  3.  Pt will report improvement (subjective) in his voice quality between 2 sessions  Baseline: 07/15/23, 07/23/23 Goal status: met  4.  Pt will report better vocal stamina between 2 sessions Baseline:  Goal status: not met  5.  Pt will demo AB 90% in 10 minutes simple conversation in two sessions Baseline: 07/23/23 Goal status: partially met  LONG TERM GOALS: Target date: 08/24/23  Pt will improve PROMs compared to initial administration Baseline:  Goal status: INITIAL  2.  Pt s/z ratio will improve to <1.1 after 08/12/23 Baseline:  Goal status: INITIAL  3.  Pt will complete PhoRTE exercises with modified independence between 2 sessions Baseline:  Goal status: INITIAL  4.  Pt will demo AB 90% in 10 minutes mod complex conversation in two sessions Baseline:  Goal status: INITIAL  ASSESSMENT:  CLINICAL IMPRESSION: Patient is a 73 y.o. M who was seen today for treatment of voice in light of vocal fold atrophy dx'd earlier this month. See "treatment date" for details on today's session. On eval date pt reported he experiences decr'd vocal stamina, and states his wife and family are "always" asking him to repeat due to voice being too soft. Pt is currently performing VFE and PhoRTE exercises to strengthen vocal fold musculature and on AB to maximize breath support for speech.   OBJECTIVE IMPAIRMENTS: include voice disorder. These impairments are limiting  patient from managing medications, managing appointments, managing finances, household responsibilities, ADLs/IADLs, and effectively communicating at home and in community. Factors affecting potential to achieve goals and functional outcome are  none noted today .Marland Kitchen Patient will benefit from skilled SLP services to address above impairments and improve overall function.  REHAB POTENTIAL: Excellent  PLAN:  SLP FREQUENCY: 1x/week  SLP DURATION: 8 weeks  PLANNED INTERVENTIONS: Cueing hierachy, Internal/external aids, Oral motor exercises, Functional tasks, SLP instruction and feedback, Compensatory strategies, Patient/family education, and [address] Treatment of speech (30 or 45 min)     Aerin Delany, CCC-SLP 07/29/2023, 4:26 PM

## 2023-07-30 DIAGNOSIS — I10 Essential (primary) hypertension: Secondary | ICD-10-CM | POA: Diagnosis not present

## 2023-07-30 DIAGNOSIS — E1140 Type 2 diabetes mellitus with diabetic neuropathy, unspecified: Secondary | ICD-10-CM | POA: Diagnosis not present

## 2023-07-30 DIAGNOSIS — H35033 Hypertensive retinopathy, bilateral: Secondary | ICD-10-CM | POA: Diagnosis not present

## 2023-07-30 DIAGNOSIS — E782 Mixed hyperlipidemia: Secondary | ICD-10-CM | POA: Diagnosis not present

## 2023-08-03 ENCOUNTER — Other Ambulatory Visit (HOSPITAL_COMMUNITY): Payer: Self-pay

## 2023-08-05 ENCOUNTER — Encounter (INDEPENDENT_AMBULATORY_CARE_PROVIDER_SITE_OTHER): Payer: Self-pay | Admitting: Otolaryngology

## 2023-08-05 ENCOUNTER — Ambulatory Visit (INDEPENDENT_AMBULATORY_CARE_PROVIDER_SITE_OTHER): Payer: Medicare Other | Admitting: Otolaryngology

## 2023-08-05 VITALS — BP 106/72 | HR 68

## 2023-08-05 DIAGNOSIS — Z719 Counseling, unspecified: Secondary | ICD-10-CM

## 2023-08-05 DIAGNOSIS — K219 Gastro-esophageal reflux disease without esophagitis: Secondary | ICD-10-CM

## 2023-08-05 DIAGNOSIS — R0981 Nasal congestion: Secondary | ICD-10-CM | POA: Diagnosis not present

## 2023-08-05 DIAGNOSIS — R490 Dysphonia: Secondary | ICD-10-CM

## 2023-08-05 DIAGNOSIS — J3089 Other allergic rhinitis: Secondary | ICD-10-CM

## 2023-08-05 DIAGNOSIS — R0982 Postnasal drip: Secondary | ICD-10-CM | POA: Diagnosis not present

## 2023-08-05 DIAGNOSIS — G4733 Obstructive sleep apnea (adult) (pediatric): Secondary | ICD-10-CM | POA: Diagnosis not present

## 2023-08-05 DIAGNOSIS — J383 Other diseases of vocal cords: Secondary | ICD-10-CM | POA: Diagnosis not present

## 2023-08-05 NOTE — Progress Notes (Signed)
 ENT Progress Note:   Update 08/05/2023  Discussed the use of AI scribe software for clinical note transcription with the patient, who gave verbal consent to proceed.  History of Present Illness William Booth "William Booth" is a 73 year old male who presents for fu/u of hoarseness and vocal fold atrophy after a course of voice therapy.  He is experiencing persistent hoarseness. Speech therapy sessions have not been effective in alleviating his symptoms.  He is currently taking Zyrtec and Flonase. His reflux is under control with Pepcid, as he has not experienced reflux symptoms in weeks. He was previously on Prilosec as part of a dual therapy regimen.  He has a history of bypass surgery and uses a CPAP machine. He has lost weight recently and has a referral for a repeat sleep study to assess the need for continued CPAP use.    Records Reviewed:  Initial Evaluation  Reason for Consult: dysphonia x 6-7 mo   HPI: Discussed the use of AI scribe software for clinical note transcription with the patient, who gave verbal consent to proceed.  History of Present Illness   William Booth "William Booth" is a 73 year old male who presents with worsening hoarseness for 6-7 months.  He has been experiencing voice changes for the past six to seven months, characterized by a lower, softer, and hoarse voice, particularly worsening with prolonged talking. He describes the sensation as 'like air is not moving through my larynx.' There is significant hoarseness and a 'grunty' quality to his voice, but no pain while talking, shortness of breath, or trouble swallowing.  He has a history of worsening reflux symptoms, described as 'reflux attacks' causing significant discomfort. He has a hx of gastric band procedure a few years ago and lost significant amount of weight with current BMI of 23. He carries a bottle of water to help alleviate these symptoms, noting that drinking water seems to 'wash the acid down.' He is  currently taking 40 mg of Prilosec in the evenings for reflux management. He has not consulted with the surgeon who performed his gastric band surgery regarding these symptoms.  He uses an over-the-counter nasal spray, Afrin, for congestion, but not regularly. He experiences postnasal drainage and has not been on any allergy pills recently.  He uses a CPAP machine every night with a nasal mask and reports experiencing dry mouth more often than not. He has not had a recent sleep study since his significant weight loss over the past twelve years, which was gradual. He has been on Trulicity since its release for diabetes.      Office visit with Dr Maple Hudson 04/04/23 male never smoker followed for OSA, complicated by HBP, GERD,  DM 2, hypothyroid, bariatric lap band surgery 2007, kidney stones   The patient, a 73 year old male with a history of obstructive sleep apnea, hypertension, GERD, type 2 diabetes, hypothyroidism, and lumbar degenerative disc disease, presents with chronic hoarseness and a dry cough. The hoarseness has been constant since October and has not improved with a course of prednisone. The patient notes that his voice temporarily improved following an endoscopy and colonoscopy in October, but the hoarseness returned after a week or two. The patient also reports chronic acid reflux and has been losing his voice over the past few months.   In addition to the hoarseness, the patient has a dry cough that is hard to stop once it starts. The patient is currently using a CPAP machine for his obstructive sleep apnea, with  a compliance rate of 80% and an AHI of 3.1 per hour. The patient reports not sleeping well, despite taking alprazolam and amitriptyline, which are known to aid sleep. The patient also occasionally takes morphine for back pain related to his lumbar degenerative disc disease.    His weight is now near goal after bariatric surgery, down from max around 400 lbs.   Hoarseness Chronic  hoarseness since October, worsens in the evening. Brief improvement post-endoscopy. Associated with chronic acid reflux. Dry cough present. -Order chest X-ray today. -Refer to ENT for further evaluation.  Past Medical History:  Diagnosis Date   Anxiety    Diabetes mellitus    type 2   GERD (gastroesophageal reflux disease)    Hypertension    Hypothyroidism    pt states no thyroid problems   Low HDL (under 40)    Narcolepsy    Peripheral neuropathy    not taking any meds at this time   Sleep apnea    uses CPAP, last sleep study 1999, had titration 11/2011   Type II diabetes mellitus with stage 2 chronic kidney disease (HCC) 03/19/2016   Umbilical hernia     Past Surgical History:  Procedure Laterality Date   broken finger     right hand   CARDIAC CATHETERIZATION  06/2009   CLOSED REDUCTION FINGER WITH PERCUTANEOUS PINNING Left 09/30/2013   Procedure: LEFT SMALL FINGER METACARPAL CLOSED REDUCTION WITH PERCUTANEOUS PINNING;  Surgeon: Sharma Covert, MD;  Location: [facility] OR;  Service: Orthopedics;  Laterality: Left;   COLONOSCOPY     CYSTOSCOPY W/ URETERAL STENT PLACEMENT Left 05/27/2017   Procedure: CYSTOSCOPY WITH RETROGRADE PYELOGRAM/URETERAL STENT PLACEMENT;  Surgeon: Rene Paci, MD;  Location: [facility];  Service: Urology;  Laterality: Left;  ONLY NEEDS 15 MIN   CYSTOSCOPY/URETEROSCOPY/HOLMIUM LASER/STENT PLACEMENT Left 06/07/2017   Procedure: CYSTOSCOPY/URETEROSCOPY/HOLMIUM LASER/STENT PLACEMENT;  Surgeon: Rene Paci, MD;  Location: [facility];  Service: Urology;  Laterality: Left;  ONLY NEEDS 30 MIN FOR PROCEDURE   GASTRIC RESTRICTION SURGERY  02/18/06   lap band   TOOTH EXTRACTION  02/28/12   UMBILICAL HERNIA REPAIR  03/20/2012   Procedure: LAPAROSCOPIC UMBILICAL HERNIA;  Surgeon: Kandis Cocking, MD;  Location: [facility] OR;  Service: General;  Laterality: N/A;    Family History  Problem Relation Age of Onset   Heart  failure Mother    CAD Father    Diabetes Brother     Social History:  reports that he has never smoked. He has never used smokeless tobacco. He reports that he does not drink alcohol and does not use drugs.  Allergies:  Allergies  Allergen Reactions   Acetaminophen Anaphylaxis   Ibuprofen Anaphylaxis   Naproxen Sodium Anaphylaxis   Nsaids Hives, Rash and Anaphylaxis    Face only will spread if not caught early.  Can take ASA   Other Anaphylaxis   Latex Other (See Comments)    Medications: I have reviewed the patient's current medications.  The PMH, PSH, Medications, Allergies, and SH were reviewed and updated.  ROS: Constitutional: Negative for fever, weight loss and weight gain. Cardiovascular: Negative for chest pain and dyspnea on exertion. Respiratory: Is not experiencing shortness of breath at rest. Gastrointestinal: Negative for nausea and vomiting. Neurological: Negative for headaches. Psychiatric: The patient is not nervous/anxious  Blood pressure 106/72, pulse 68, SpO2 91%.  PHYSICAL EXAM:  Exam: General: Well-developed, well-nourished Communication and Voice: raspy Respiratory Respiratory effort: Equal inspiration and expiration without stridor  Cardiovascular Peripheral Vascular: Warm extremities with equal color/perfusion Eyes: No nystagmus with equal extraocular motion bilaterally Neuro/Psych/Balance: Patient oriented to person, place, and time; Appropriate mood and affect; Gait is intact with no imbalance; Cranial nerves I-XII are intact Head and Face Inspection: Normocephalic and atraumatic without mass or lesion Palpation: Facial skeleton intact without bony stepoffs Salivary Glands: No mass or tenderness Facial Strength: Facial motility symmetric and full bilaterally ENT Pinna: External ear intact and fully developed External canal: Canal is patent with intact skin Tympanic Membrane: Clear and mobile External Nose: No scar or anatomic  deformity Internal Nose: Septum is deviated to the left. No polyp, or purulence. Mucosal edema and erythema present.  Bilateral inferior turbinate hypertrophy.  Lips, Teeth, and gums: Mucosa and teeth intact and viable TMJ: No pain to palpation with full mobility Oral cavity/oropharynx: No erythema or exudate, no lesions present Nasopharynx: No mass or lesion with intact mucosa Hypopharynx: Intact mucosa without pooling of secretions Larynx Glottic: Full true vocal cord mobility without lesion or mass, VF atrophy and glottic insufficiency Supraglottic: Normal appearing epiglottis and AE folds Interarytenoid Space: Moderate pachydermia&edema Subglottic Space: Patent without lesion or edema Neck Neck and Trachea: Midline trachea without mass or lesion Thyroid: No mass or nodularity Lymphatics: No lymphadenopathy  Procedure:  Preoperative diagnosis: hoarseness VF atrophy  Postoperative diagnosis:   same + glottic insufficiency + GERD LPR  Procedure: Flexible fiberoptic laryngoscopy with stroboscopy (08657)   Surgeon: Ashok Croon, MD  Anesthesia: Topical lidocaine and Afrin  Complications: None  Condition is stable throughout exam  Indications and consent:   The patient presents to the clinic with hoarseness. All the risks, benefits, and potential complications were reviewed with the patient preoperatively and informed verbal consent was obtained.  Procedure: The patient was seated upright in the exam chair.   Topical lidocaine and Afrin were applied to the nasal cavity. After adequate anesthesia had occurred, the flexible telescope with strobe capabilities was passed into the nasal cavity. The nasopharynx was patent without mass or lesion. The scope was passed behind the soft palate and directed toward the base of tongue. The base of tongue was visualized and was symmetric with no apparent masses or abnormal appearing tissue. There were no signs of a mass or pooling of secretions  in the piriform sinuses. The supraglottic structures were normal.  The true vocal cords are mobile. The medial edges were bowed. Closure was incomplete. Periodicity present. The mucosal wave and amplitude were intact normal and symmetric. There is moderate interarytenoid pachydermia and post cricoid edema. The mucosa appears with two prominent blood vessels dorsal L and R VF mid 1/3.   The laryngoscope was then slowly withdrawn and the patient tolerated the procedure well. There were no complications or blood loss.   Studies Reviewed: CXR 2 View  04/15/23 FINDINGS: The heart size and mediastinal contours are within normal limits. Both lungs are clear. Thoracic spine curvature and degenerative changes.   IMPRESSION: No active cardiopulmonary disease  Assessment/Plan: Encounter Diagnoses  Name Primary?   Age-related vocal fold atrophy Yes   Glottic insufficiency    Dysphonia    Chronic GERD    Obstructive sleep apnea    Environmental and seasonal allergies    Chronic nasal congestion    Post-nasal drip      Assessment and Plan    Voice Changes/Dysphonia - exam with VF atrophy and  glottic insufficiency Voice changes for 6-7 months, worsening with prolonged talking. Examination revealed thin VF with glottic insufficiency, likely  due to vocal cord atrophy. No evidence of tumor or paralysis. Possible contributing factors include postnasal drainage and reflux changes, both noted on exam today. Discussed treatment options including optimizing reflux control, managing postnasal drainage, and voice therapy. If voice therapy fails, procedural interventions such as injection augmentation or medialization thyroplasty procedures may be considered. Emphasized that these procedures are not first-line treatments. Patient expressed understanding and willingness to try voice therapy first. - Refer to neuro rehab center for voice therapy - Prescribe Flonase nasal spray 2 puffs b/l nares twice a  day - Recommend ocean spray TID  - Prescribe Zyrtec at night 10 mg daily - Instructed to take Prilosec 40 mg 30 minutes before breakfast and Pepcid 20 mg QHS - Prescribed Pepcid 20 mg to be taken at night - Recommend trial of reflux gourmet after meals  Chronic Gastroesophageal Reflux Disease (GERD) Worsened reflux symptoms, especially after gastric band surgery. Currently on Prilosec 40 mg in the evenings. Symptoms include severe reflux attacks and hoarseness. Discussed the potential link between gastric band surgery and increased reflux. Advised consulting with the gastric band surgeon to evaluate this relationship. - Instructed to take Prilosec 40 mg 30 minutes before breakfast and Pepcid 20 mg QHS - Prescribed Pepcid 20 mg to be taken at night - Recommend trial of reflux gourmet after meals - Advise to consult with gastric band surgeon regarding reflux symptoms  Chronic nasal congestion, suspected environmental allergies and post-nasal drip - Prescribe Flonase nasal spray 2 puffs b/l nares twice a day - Recommend ocean spray TID  - Prescribe Zyrtec at night 10 mg daily - stop Afrin use  Obstructive Sleep Apnea (OSA) on CPAP OSA managed with CPAP. Significant weight loss over 12 years. Last sleep study was a long time ago. Potential for resolution of OSA due to weight loss. Discussed the possibility of no longer needing CPAP if sleep study shows resolution of OSA. - Recommend repeat sleep study to evaluate current need for CPAP - he will discuss with his sleep physician    Follow-up - Schedule follow-up appointment after completion of voice therapy and trial of new medications.      Update 08/05/2023 Assessment and Plan Assessment & Plan Vocal fold atrophy Glottic insufficiency, Dysphonia Hoarseness due to vocal fold atrophy with persistent glottic gap on strobe exam today. Did not find speech therapy beneficial. Vocal fold injection augmentation considered to improve voice  projection. Initial swelling post-injection, improvement lasts 6-12 months. Potential for permanent implant/thyroplasty discussed if effective. - Schedule vocal fold injection augmentation - Ensure insurance preapproval. - he has Xanax at home and would like to take it prior to procedure for relaxation, he will take it 30 min prior to procedure and will ensure he has a ride home.  Obstructive sleep apnea Uses CPAP therapy, lost significant amount of weight after bariatric surgery. Referral for repeat sleep study provided to assess current CPAP need. - Follow up on referral for repeat sleep study.  Chronic Gastroesophageal Reflux Disease (GERD) Sx of heartburn improved - continue Prilosec 40 mg 30 minutes before breakfast  - continue Pepcid 20 mg to be taken at night -  reflux gourmet after meals  Chronic nasal congestion, suspected environmental allergies and post-nasal drip - continue Flonase nasal spray 2 puffs b/l nares twice a day - Recommend ocean spray TID  - continue Zyrtec at night 10 mg daily    Ashok Croon, MD Otolaryngology [facility] Phone: [phone] Fax: [phone]    08/05/2023, 3:02 PM

## 2023-08-07 ENCOUNTER — Ambulatory Visit: Payer: Medicare Other

## 2023-08-12 ENCOUNTER — Encounter (INDEPENDENT_AMBULATORY_CARE_PROVIDER_SITE_OTHER): Payer: Self-pay | Admitting: Otolaryngology

## 2023-08-12 ENCOUNTER — Other Ambulatory Visit (HOSPITAL_COMMUNITY): Payer: Self-pay

## 2023-08-12 ENCOUNTER — Ambulatory Visit (INDEPENDENT_AMBULATORY_CARE_PROVIDER_SITE_OTHER): Admitting: Otolaryngology

## 2023-08-12 VITALS — BP 106/69 | HR 67

## 2023-08-12 DIAGNOSIS — J383 Other diseases of vocal cords: Secondary | ICD-10-CM

## 2023-08-12 DIAGNOSIS — R490 Dysphonia: Secondary | ICD-10-CM | POA: Diagnosis not present

## 2023-08-12 MED ORDER — METHYLPREDNISOLONE 4 MG PO TBPK
ORAL_TABLET | ORAL | 1 refills | Status: AC
  Filled 2023-08-12: qty 21, 6d supply, fill #0
  Filled 2023-08-17: qty 21, 6d supply, fill #1

## 2023-08-12 NOTE — Progress Notes (Signed)
   Therapeutic Vocal Cord Injection CPT 52841-32 ATTENDING: Artice Last, MD   PREOPERATIVE DIAGNOSIS(ES):  1. Hoarseness; atrophy bilateral vocal folds 2. Glottic insufficiency  POSTOPERATIVE DIAGNOSIS(ES): Same  PROCEDURE PERFORMED: Laryngoscopy with restylane injection into the bilateral lateral thyroarytenoid muscle(s)  INDICATIONS FOR PROCEDURE: The risks and benefits of the surgical procedure have been explained in detail to the patient and they have elected to proceed.  CONSENT:  Informed consent was obtained prior to the procedure after discussion of risks, benefits, and alternatives and expected outcomes were discussed with the patient; consent placed in chart. The possibilities of reaction to medication, pulmonary aspiration, bleeding, infection, the need for additional procedures, failure to diagnose a condition, and creating a complication requiring transfusion or operation were discussed with the patient. The patient concurred with the proposed plan, giving informed consent.    UNIVERSAL PROTOCOL/ TIMEOUT: Preprocedure verification is complete- patient verified and consents confirmed.  ANESTHESIA: local anesthesia H&P REVIEW: The patient's history and physical were reviewed today prior to procedure. All medications were reviewed and updated as well.  PROCEDURE DETAILS:  The patient was brought to the clinic and placed in a seated position.  The anterior neck skin was then cleansed with alcohol and 1% Lidocaine was used to infiltrate the skin overlying the thyrohyoid membrane. Patient had a NEB with 4% lidocaine prior the start of the procedure, to ensure good anesthesia and procedure tolerance. Afrin/Lidocaine mixture was then used to anesthetize the nasal passages. The 1.5-inch 25-gauge needle was bent with two gentle curves in same direction.  The flexible laryngoscope was then passed through the patient's nasal passageway and advanced into the larynx. The nasopharynx was free  of any lesions. The scope was passed behind the soft palate and directed toward the base of tongue. The supraglottic structures were normal in appearance. The true folds show atrophy and left vocal fold paralysis. The 23-gauge needle was passed through the petiole and 4% lidocaine was dripped on the cords while the patient phonated. It was then attached to a luer-lock syringe filled with the filler and advanced into the vocal fold, and injection was performed into the bilateral thyroarytenoid muscle(s) at a site just anterior to the vocal process. A second injection was performed at mid-fold. The augmentation carried out until some overmedialization was noted. This was then repeated on the contralateral side. The needle was then removed from the vocal fold and the airway. The scope was removed from the nasal cavity. This completed the procedure. The patient tolerated the procedure well. IMPLANTS: Restylane-L Hyaluronic Acid  ESTIMATED BLOOD LOSS: None  SPECIMEN(S) REMOVED: None  DISPOSITION OF SPECIMEN(S): NA.  FINDINGS:  No evidence of a hematoma and the airway remained patent  CONDITION: Stable  COMPLICATIONS:The patient tolerated the procedure well without apparent complications and was ambulatory.  NOTES: will likely require repeat injection on the left side 2/2 severe atrophy, scoped through left side   PLAN: RTC 3 weeks for repeat videostrobe

## 2023-08-14 ENCOUNTER — Ambulatory Visit: Payer: Medicare Other

## 2023-08-17 ENCOUNTER — Other Ambulatory Visit (HOSPITAL_COMMUNITY): Payer: Self-pay

## 2023-08-17 ENCOUNTER — Other Ambulatory Visit: Payer: Self-pay | Admitting: Internal Medicine

## 2023-08-17 NOTE — Telephone Encounter (Signed)
 Please advise on refill request   Allergies  Allergen Reactions   Acetaminophen  Anaphylaxis   Ibuprofen Anaphylaxis   Naproxen Sodium Anaphylaxis   Nsaids Hives, Rash and Anaphylaxis    Face only will spread if not caught early.  Can take ASA   Other Anaphylaxis   Latex Other (See Comments)     Current Outpatient Medications:    ALPRAZolam  (XANAX ) 0.5 MG tablet, Take 1 tablet (0.5 mg total) by mouth twice a day as needed, Disp: 60 tablet, Rfl: 2   ALPRAZolam  (XANAX ) 0.5 MG tablet, Take 1 tablet (0.5 mg total) by mouth 2 (two) times daily as needed. (Patient not taking: Reported on 08/12/2023), Disp: 60 tablet, Rfl: 2   amitriptyline  (ELAVIL ) 100 MG tablet, Take 1 tablet (100 mg total) by mouth at bedtime., Disp: 90 tablet, Rfl: 1   amphetamine -dextroamphetamine  (ADDERALL  XR) 20 MG 24 hr capsule, Take 1 capsule (20 mg) by mouth daily., Disp: 90 capsule, Rfl: 0   atenolol  (TENORMIN ) 25 MG tablet, Take 1 tablet (25 mg total) by mouth 2 (two) times daily., Disp: 180 tablet, Rfl: 1   cetirizine  (ZYRTEC ) 10 MG tablet, Take 1 tablet (10 mg total) by mouth daily., Disp: 30 tablet, Rfl: 11   empagliflozin  (JARDIANCE ) 10 MG TABS tablet, Take 1 tablet (10 mg total) by mouth daily., Disp: 90 tablet, Rfl: 1   ezetimibe  (ZETIA ) 10 MG tablet, Take 1 tablet by mouth once a day, Disp: 90 tablet, Rfl: 3   famotidine  (PEPCID ) 20 MG tablet, Take 1 tablet (20 mg total) by mouth at bedtime., Disp: 30 tablet, Rfl: 3   fluticasone  (FLONASE ) 50 MCG/ACT nasal spray, Place 2 sprays into both nostrils 2 (two) times daily., Disp: 16 g, Rfl: 6   metFORMIN  (GLUCOPHAGE ) 500 MG tablet, Take 1 tablet (500 mg total) by mouth 2 (two) times daily with a meal., Disp: 180 tablet, Rfl: 1   methylPREDNISolone  (MEDROL  DOSEPAK) 4 MG TBPK tablet, Take as directed on package with signs of chronic sinusitis., Disp: 21 each, Rfl: 1   morphine  (MSIR) 15 MG tablet, Take 1 tablet (15 mg total) by mouth 2 (two) times daily as needed.,  Disp: 60 tablet, Rfl: 0   MOUNJARO 5 MG/0.5ML Pen, Inject 5 mg into the skin once a week., Disp: , Rfl:    naloxone  (NARCAN ) nasal spray 4 mg/0.1 mL, Place one spray in one nostril every 3 minutes until patient awakes or EMS arrives., Disp: 2 each, Rfl: 0   omeprazole  (PRILOSEC) 40 MG capsule, Take 1 capsule (40 mg total) by mouth 30 minutes before morning meal., Disp: 90 capsule, Rfl: 1   rosuvastatin  (CRESTOR ) 40 MG tablet, Take 1 tablet (40 mg total) by mouth daily., Disp: 90 tablet, Rfl: 1   sildenafil  (VIAGRA ) 50 MG tablet, Take 1 tablet by mouth once a day as needed, 30 minutes before sex, Disp: 30 tablet, Rfl: 1   sodium chloride  (OCEAN) 0.65 % SOLN nasal spray, Place 1 spray into both nostrils as needed for congestion., Disp: 44 mL, Rfl: 6   vitamin B-12 (CYANOCOBALAMIN) 1000 MCG tablet, Take 1,000 mcg by mouth daily., Disp: , Rfl:

## 2023-08-19 ENCOUNTER — Other Ambulatory Visit: Payer: Self-pay

## 2023-08-19 ENCOUNTER — Other Ambulatory Visit (HOSPITAL_COMMUNITY): Payer: Self-pay

## 2023-08-19 MED ORDER — MORPHINE SULFATE 15 MG PO TABS
15.0000 mg | ORAL_TABLET | Freq: Two times a day (BID) | ORAL | 0 refills | Status: DC
  Filled 2023-08-19: qty 60, 30d supply, fill #0

## 2023-08-20 ENCOUNTER — Other Ambulatory Visit: Payer: Self-pay

## 2023-08-20 ENCOUNTER — Other Ambulatory Visit (HOSPITAL_COMMUNITY): Payer: Self-pay

## 2023-08-20 ENCOUNTER — Encounter (HOSPITAL_COMMUNITY): Payer: Self-pay | Admitting: Pharmacist

## 2023-08-20 MED ORDER — AMPHETAMINE-DEXTROAMPHET ER 20 MG PO CP24
20.0000 mg | ORAL_CAPSULE | Freq: Every day | ORAL | 0 refills | Status: DC
Start: 2023-08-20 — End: 2023-09-25
  Filled 2023-08-20: qty 30, 30d supply, fill #0
  Filled 2023-09-24: qty 30, 30d supply, fill #1

## 2023-08-20 NOTE — Telephone Encounter (Signed)
 Adderall refilled

## 2023-08-21 ENCOUNTER — Ambulatory Visit: Payer: Medicare Other

## 2023-09-02 ENCOUNTER — Ambulatory Visit (INDEPENDENT_AMBULATORY_CARE_PROVIDER_SITE_OTHER): Admitting: Otolaryngology

## 2023-09-02 VITALS — BP 117/79 | HR 80

## 2023-09-02 DIAGNOSIS — G4733 Obstructive sleep apnea (adult) (pediatric): Secondary | ICD-10-CM | POA: Diagnosis not present

## 2023-09-02 DIAGNOSIS — J383 Other diseases of vocal cords: Secondary | ICD-10-CM | POA: Diagnosis not present

## 2023-09-02 DIAGNOSIS — J3089 Other allergic rhinitis: Secondary | ICD-10-CM

## 2023-09-02 DIAGNOSIS — R490 Dysphonia: Secondary | ICD-10-CM | POA: Diagnosis not present

## 2023-09-02 DIAGNOSIS — R0982 Postnasal drip: Secondary | ICD-10-CM

## 2023-09-02 DIAGNOSIS — K219 Gastro-esophageal reflux disease without esophagitis: Secondary | ICD-10-CM | POA: Diagnosis not present

## 2023-09-02 DIAGNOSIS — R0981 Nasal congestion: Secondary | ICD-10-CM

## 2023-09-02 NOTE — Progress Notes (Unsigned)
 ENT Progress Note:   Update 09/02/23  Discussed the use of AI scribe software for clinical note transcription with the patient, who gave verbal consent to proceed.  History of Present Illness William DECLEENE "Siegfried Dress" is a 73 year old male who presents for fu/u of hoarseness and vocal fold atrophy after b/l Restylane injection augmentation.   He has some residual hoarseness but overall reports improved voice projection.  He is currently taking Zyrtec  and Flonase . His reflux is under control with Pepcid , as he has not experienced reflux symptoms.   Records Reviewed:  Initial Evaluation  Reason for Consult: dysphonia x 6-7 mo   HPI: Discussed the use of AI scribe software for clinical note transcription with the patient, who gave verbal consent to proceed.  History of Present Illness   William BREDEHOFT "Siegfried Dress" is a 73 year old male who presents with worsening hoarseness for 6-7 months.  He has been experiencing voice changes for the past six to seven months, characterized by a lower, softer, and hoarse voice, particularly worsening with prolonged talking. He describes the sensation as 'like air is not moving through my larynx.' There is significant hoarseness and a 'grunty' quality to his voice, but no pain while talking, shortness of breath, or trouble swallowing.  He has a history of worsening reflux symptoms, described as 'reflux attacks' causing significant discomfort. He has a hx of gastric band procedure a few years ago and lost significant amount of weight with current BMI of 23. He carries a bottle of water to help alleviate these symptoms, noting that drinking water seems to 'wash the acid down.' He is currently taking 40 mg of Prilosec in the evenings for reflux management. He has not consulted with the surgeon who performed his gastric band surgery regarding these symptoms.  He uses an over-the-counter nasal spray, Afrin, for congestion, but not regularly. He experiences postnasal  drainage and has not been on any allergy pills recently.  He uses a CPAP machine every night with a nasal mask and reports experiencing dry mouth more often than not. He has not had a recent sleep study since his significant weight loss over the past twelve years, which was gradual. He has been on Trulicity  since its release for diabetes.      Office visit with Dr Linder Revere 04/04/23 male never smoker followed for OSA, complicated by HBP, GERD,  DM 2, hypothyroid, bariatric lap band surgery 2007, kidney stones   The patient, a 73 year old male with a history of obstructive sleep apnea, hypertension, GERD, type 2 diabetes, hypothyroidism, and lumbar degenerative disc disease, presents with chronic hoarseness and a dry cough. The hoarseness has been constant since October and has not improved with a course of prednisone . The patient notes that his voice temporarily improved following an endoscopy and colonoscopy in October, but the hoarseness returned after a week or two. The patient also reports chronic acid reflux and has been losing his voice over the past few months.   In addition to the hoarseness, the patient has a dry cough that is hard to stop once it starts. The patient is currently using a CPAP machine for his obstructive sleep apnea, with a compliance rate of 80% and an AHI of 3.1 per hour. The patient reports not sleeping well, despite taking alprazolam  and amitriptyline , which are known to aid sleep. The patient also occasionally takes morphine  for back pain related to his lumbar degenerative disc disease.    His weight is now near goal after  bariatric surgery, down from max around 400 lbs.   Hoarseness Chronic hoarseness since October, worsens in the evening. Brief improvement post-endoscopy. Associated with chronic acid reflux. Dry cough present. -Order chest X-ray today. -Refer to ENT for further evaluation.  Past Medical History:  Diagnosis Date   Anxiety    Diabetes mellitus    type  2   GERD (gastroesophageal reflux disease)    Hypertension    Hypothyroidism    pt states no thyroid  problems   Low HDL (under 40)    Narcolepsy    Peripheral neuropathy    not taking any meds at this time   Sleep apnea    uses CPAP, last sleep study 1999, had titration 11/2011   Type II diabetes mellitus with stage 2 chronic kidney disease (HCC) 03/19/2016   Umbilical hernia     Past Surgical History:  Procedure Laterality Date   broken finger     right hand   CARDIAC CATHETERIZATION  06/2009   CLOSED REDUCTION FINGER WITH PERCUTANEOUS PINNING Left 09/30/2013   Procedure: LEFT SMALL FINGER METACARPAL CLOSED REDUCTION WITH PERCUTANEOUS PINNING;  Surgeon: Shellie Dials, MD;  Location: [facility] OR;  Service: Orthopedics;  Laterality: Left;   COLONOSCOPY     CYSTOSCOPY W/ URETERAL STENT PLACEMENT Left 05/27/2017   Procedure: CYSTOSCOPY WITH RETROGRADE PYELOGRAM/URETERAL STENT PLACEMENT;  Surgeon: Adelbert Homans, MD;  Location: [facility];  Service: Urology;  Laterality: Left;  ONLY NEEDS 15 MIN   CYSTOSCOPY/URETEROSCOPY/HOLMIUM LASER/STENT PLACEMENT Left 06/07/2017   Procedure: CYSTOSCOPY/URETEROSCOPY/HOLMIUM LASER/STENT PLACEMENT;  Surgeon: Adelbert Homans, MD;  Location: [facility];  Service: Urology;  Laterality: Left;  ONLY NEEDS 30 MIN FOR PROCEDURE   GASTRIC RESTRICTION SURGERY  02/18/06   lap band   TOOTH EXTRACTION  02/28/12   UMBILICAL HERNIA REPAIR  03/20/2012   Procedure: LAPAROSCOPIC UMBILICAL HERNIA;  Surgeon: Thayne Fine, MD;  Location: [facility] OR;  Service: General;  Laterality: N/A;    Family History  Problem Relation Age of Onset   Heart failure Mother    CAD Father    Diabetes Brother     Social History:  reports that he has never smoked. He has never used smokeless tobacco. He reports that he does not drink alcohol and does not use drugs.  Allergies:  Allergies  Allergen Reactions   Acetaminophen  Anaphylaxis    Ibuprofen Anaphylaxis   Naproxen Sodium Anaphylaxis   Nsaids Hives, Rash and Anaphylaxis    Face only will spread if not caught early.  Can take ASA   Other Anaphylaxis   Latex Other (See Comments)    Medications: I have reviewed the patient's current medications.  The PMH, PSH, Medications, Allergies, and SH were reviewed and updated.  ROS: Constitutional: Negative for fever, weight loss and weight gain. Cardiovascular: Negative for chest pain and dyspnea on exertion. Respiratory: Is not experiencing shortness of breath at rest. Gastrointestinal: Negative for nausea and vomiting. Neurological: Negative for headaches. Psychiatric: The patient is not nervous/anxious  Blood pressure 117/79, pulse 80, SpO2 99%.  PHYSICAL EXAM:  Exam: General: Well-developed, well-nourished Communication and Voice: improved projection Respiratory Respiratory effort: Equal inspiration and expiration without stridor Cardiovascular Peripheral Vascular: Warm extremities with equal color/perfusion Eyes: No nystagmus with equal extraocular motion bilaterally Neuro/Psych/Balance: Patient oriented to person, place, and time; Appropriate mood and affect; Gait is intact with no imbalance; Cranial nerves I-XII are intact Head and Face Inspection: Normocephalic and atraumatic without mass or lesion Palpation: Facial skeleton intact  without bony stepoffs Salivary Glands: No mass or tenderness Facial Strength: Facial motility symmetric and full bilaterally ENT Pinna: External ear intact and fully developed External canal: Canal is patent with intact skin Tympanic Membrane: Clear and mobile External Nose: No scar or anatomic deformity Internal Nose: Septum is deviated to the left. No polyp, or purulence. Mucosal edema and erythema present.  Bilateral inferior turbinate hypertrophy.  Lips, Teeth, and gums: Mucosa and teeth intact and viable TMJ: No pain to palpation with full mobility Oral  cavity/oropharynx: No erythema or exudate, no lesions present Nasopharynx: No mass or lesion with intact mucosa Hypopharynx: Intact mucosa without pooling of secretions Larynx Glottic: Full true vocal cord mobility without lesion or mass, s/p injection augmentation, VF edges are straight with good glottic closure Supraglottic: Normal appearing epiglottis and AE folds Interarytenoid Space: Moderate pachydermia&edema Subglottic Space: Patent without lesion or edema Neck Neck and Trachea: Midline trachea without mass or lesion Thyroid : No mass or nodularity Lymphatics: No lymphadenopathy  Procedure:  Preoperative diagnosis: hx of dysphonia s/p injection augmentation   Postoperative diagnosis:   same  + GERD LPR  Procedure: Flexible fiberoptic laryngoscopy with stroboscopy (16109)   Surgeon: Artice Last, MD  Anesthesia: Topical lidocaine  and Afrin  Complications: None  Condition is stable throughout exam  Indications and consent:   The patient presents to the clinic with hoarseness. All the risks, benefits, and potential complications were reviewed with the patient preoperatively and informed verbal consent was obtained.  Procedure: The patient was seated upright in the exam chair.   Topical lidocaine  and Afrin were applied to the nasal cavity. After adequate anesthesia had occurred, the flexible telescope with strobe capabilities was passed into the nasal cavity. The nasopharynx was patent without mass or lesion. The scope was passed behind the soft palate and directed toward the base of tongue. The base of tongue was visualized and was symmetric with no apparent masses or abnormal appearing tissue. There were no signs of a mass or pooling of secretions in the piriform sinuses. The supraglottic structures were normal.  The true vocal cords are mobile. The medial edges were bowed. Closure was incomplete. Periodicity present. The mucosal wave and amplitude were intact normal and  symmetric. There is moderate interarytenoid pachydermia and post cricoid edema. S/p injection augmentation, VF edges are straight with good glottic closure.   The laryngoscope was then slowly withdrawn and the patient tolerated the procedure well. There were no complications or blood loss.   Studies Reviewed: CXR 2 View  04/15/23 FINDINGS: The heart size and mediastinal contours are within normal limits. Both lungs are clear. Thoracic spine curvature and degenerative changes.   IMPRESSION: No active cardiopulmonary disease  Assessment/Plan: Encounter Diagnoses  Name Primary?   Age-related vocal fold atrophy Yes   Dysphonia    Chronic GERD    Obstructive sleep apnea    Environmental and seasonal allergies    Chronic nasal congestion    Post-nasal drip       Assessment and Plan    Voice Changes/Dysphonia - exam with VF atrophy and  glottic insufficiency Voice changes for 6-7 months, worsening with prolonged talking. Examination revealed thin VF with glottic insufficiency, likely due to vocal cord atrophy. No evidence of tumor or paralysis. Possible contributing factors include postnasal drainage and reflux changes, both noted on exam today. Discussed treatment options including optimizing reflux control, managing postnasal drainage, and voice therapy. If voice therapy fails, procedural interventions such as injection augmentation or medialization thyroplasty procedures may be  considered. Emphasized that these procedures are not first-line treatments. Patient expressed understanding and willingness to try voice therapy first. - Refer to neuro rehab center for voice therapy - Prescribe Flonase  nasal spray 2 puffs b/l nares twice a day - Recommend ocean spray TID  - Prescribe Zyrtec  at night 10 mg daily - Instructed to take Prilosec 40 mg 30 minutes before breakfast and Pepcid  20 mg QHS - Prescribed Pepcid  20 mg to be taken at night - Recommend trial of reflux gourmet after  meals  Chronic Gastroesophageal Reflux Disease (GERD) Worsened reflux symptoms, especially after gastric band surgery. Currently on Prilosec 40 mg in the evenings. Symptoms include severe reflux attacks and hoarseness. Discussed the potential link between gastric band surgery and increased reflux. Advised consulting with the gastric band surgeon to evaluate this relationship. - Instructed to take Prilosec 40 mg 30 minutes before breakfast and Pepcid  20 mg QHS - Prescribed Pepcid  20 mg to be taken at night - Recommend trial of reflux gourmet after meals - Advise to consult with gastric band surgeon regarding reflux symptoms  Chronic nasal congestion, suspected environmental allergies and post-nasal drip - Prescribe Flonase  nasal spray 2 puffs b/l nares twice a day - Recommend ocean spray TID  - Prescribe Zyrtec  at night 10 mg daily - stop Afrin use  Obstructive Sleep Apnea (OSA) on CPAP OSA managed with CPAP. Significant weight loss over 12 years. Last sleep study was a long time ago. Potential for resolution of OSA due to weight loss. Discussed the possibility of no longer needing CPAP if sleep study shows resolution of OSA. - Recommend repeat sleep study to evaluate current need for CPAP - he will discuss with his sleep physician    Follow-up - Schedule follow-up appointment after completion of voice therapy and trial of new medications.      Update 09/02/23 Assessment and Plan Assessment & Plan Vocal fold atrophy Glottic insufficiency, Dysphonia S/p Restylane injection augmentation 08/12/23 with improved voice projection  Flexible laryngoscopy today with improved glottic closure We discussed that his voice improvement will likely last 6-12 months. Potential for permanent implant/thyroplasty discussed if effective and we can revisit this in the future. - RTC 7-8 mo for repeat scope exam   Obstructive sleep apnea Uses CPAP therapy, lost significant amount of weight after bariatric  surgery. Referral for repeat sleep study provided to assess current CPAP need. - Follow up on referral for repeat sleep study.  Chronic Gastroesophageal Reflux Disease (GERD) Sx of heartburn improved - continue Prilosec 40 mg 30 minutes before breakfast  - continue Pepcid  20 mg to be taken at night -  reflux gourmet after meals  Chronic nasal congestion, suspected environmental allergies and post-nasal drip - continue Flonase  nasal spray 2 puffs b/l nares twice a day - Recommend ocean spray TID  - continue Zyrtec  at night 10 mg daily    Artice Last, MD [facility] Phone: [phone] Fax: [phone]    09/03/2023, 7:50 AM

## 2023-09-03 DIAGNOSIS — E119 Type 2 diabetes mellitus without complications: Secondary | ICD-10-CM | POA: Diagnosis not present

## 2023-09-03 DIAGNOSIS — H35033 Hypertensive retinopathy, bilateral: Secondary | ICD-10-CM | POA: Diagnosis not present

## 2023-09-03 DIAGNOSIS — H52223 Regular astigmatism, bilateral: Secondary | ICD-10-CM | POA: Diagnosis not present

## 2023-09-03 DIAGNOSIS — H524 Presbyopia: Secondary | ICD-10-CM | POA: Diagnosis not present

## 2023-09-03 DIAGNOSIS — H2513 Age-related nuclear cataract, bilateral: Secondary | ICD-10-CM | POA: Diagnosis not present

## 2023-09-03 DIAGNOSIS — H5213 Myopia, bilateral: Secondary | ICD-10-CM | POA: Diagnosis not present

## 2023-09-03 DIAGNOSIS — H353131 Nonexudative age-related macular degeneration, bilateral, early dry stage: Secondary | ICD-10-CM | POA: Diagnosis not present

## 2023-09-25 ENCOUNTER — Other Ambulatory Visit: Payer: Self-pay

## 2023-09-25 ENCOUNTER — Other Ambulatory Visit (HOSPITAL_COMMUNITY): Payer: Self-pay

## 2023-09-25 ENCOUNTER — Other Ambulatory Visit: Payer: Self-pay | Admitting: Internal Medicine

## 2023-09-25 MED ORDER — AMPHETAMINE-DEXTROAMPHET ER 20 MG PO CP24
20.0000 mg | ORAL_CAPSULE | Freq: Every day | ORAL | 0 refills | Status: DC
  Filled 2023-09-25: qty 30, 30d supply, fill #0
  Filled 2023-11-05 – 2024-02-05 (×4): qty 30, 30d supply, fill #1

## 2023-09-25 NOTE — Telephone Encounter (Signed)
**Note De-identified  [place] Obfuscation** Please advise 

## 2023-09-25 NOTE — Telephone Encounter (Signed)
 Adderall refilled

## 2023-09-26 ENCOUNTER — Other Ambulatory Visit (HOSPITAL_COMMUNITY): Payer: Self-pay

## 2023-09-26 ENCOUNTER — Other Ambulatory Visit: Payer: Self-pay

## 2023-09-30 ENCOUNTER — Other Ambulatory Visit: Payer: Self-pay

## 2023-10-10 ENCOUNTER — Other Ambulatory Visit (HOSPITAL_COMMUNITY): Payer: Self-pay

## 2023-10-10 ENCOUNTER — Other Ambulatory Visit: Payer: Self-pay

## 2023-10-10 MED ORDER — MORPHINE SULFATE 15 MG PO TABS
15.0000 mg | ORAL_TABLET | Freq: Two times a day (BID) | ORAL | 0 refills | Status: AC
  Filled 2023-10-10: qty 60, 30d supply, fill #0

## 2023-10-28 DIAGNOSIS — Z79899 Other long term (current) drug therapy: Secondary | ICD-10-CM | POA: Diagnosis not present

## 2023-10-28 DIAGNOSIS — M5136 Other intervertebral disc degeneration, lumbar region with discogenic back pain only: Secondary | ICD-10-CM | POA: Diagnosis not present

## 2023-10-28 DIAGNOSIS — M47896 Other spondylosis, lumbar region: Secondary | ICD-10-CM | POA: Diagnosis not present

## 2023-11-05 ENCOUNTER — Other Ambulatory Visit (HOSPITAL_COMMUNITY): Payer: Self-pay

## 2023-11-07 ENCOUNTER — Other Ambulatory Visit (HOSPITAL_COMMUNITY): Payer: Self-pay

## 2023-11-12 ENCOUNTER — Other Ambulatory Visit (HOSPITAL_COMMUNITY): Payer: Self-pay

## 2023-11-15 DIAGNOSIS — M47816 Spondylosis without myelopathy or radiculopathy, lumbar region: Secondary | ICD-10-CM | POA: Diagnosis not present

## 2023-11-15 DIAGNOSIS — M47896 Other spondylosis, lumbar region: Secondary | ICD-10-CM | POA: Diagnosis not present

## 2023-11-23 ENCOUNTER — Other Ambulatory Visit (HOSPITAL_COMMUNITY): Payer: Self-pay

## 2023-11-23 DIAGNOSIS — G4733 Obstructive sleep apnea (adult) (pediatric): Secondary | ICD-10-CM | POA: Diagnosis not present

## 2023-11-25 ENCOUNTER — Other Ambulatory Visit (HOSPITAL_COMMUNITY): Payer: Self-pay

## 2023-11-26 ENCOUNTER — Other Ambulatory Visit: Payer: Self-pay

## 2023-11-27 ENCOUNTER — Other Ambulatory Visit (HOSPITAL_COMMUNITY): Payer: Self-pay

## 2023-11-28 ENCOUNTER — Other Ambulatory Visit (HOSPITAL_COMMUNITY): Payer: Self-pay

## 2023-12-03 ENCOUNTER — Other Ambulatory Visit (HOSPITAL_COMMUNITY): Payer: Self-pay

## 2023-12-10 ENCOUNTER — Other Ambulatory Visit (HOSPITAL_COMMUNITY): Payer: Self-pay

## 2023-12-10 ENCOUNTER — Other Ambulatory Visit: Payer: Self-pay

## 2023-12-12 ENCOUNTER — Other Ambulatory Visit (HOSPITAL_COMMUNITY): Payer: Self-pay

## 2023-12-17 DIAGNOSIS — M5416 Radiculopathy, lumbar region: Secondary | ICD-10-CM | POA: Diagnosis not present

## 2023-12-17 DIAGNOSIS — M5136 Other intervertebral disc degeneration, lumbar region with discogenic back pain only: Secondary | ICD-10-CM | POA: Diagnosis not present

## 2023-12-17 DIAGNOSIS — M47816 Spondylosis without myelopathy or radiculopathy, lumbar region: Secondary | ICD-10-CM | POA: Diagnosis not present

## 2024-01-09 ENCOUNTER — Other Ambulatory Visit (HOSPITAL_COMMUNITY): Payer: Self-pay

## 2024-01-16 ENCOUNTER — Other Ambulatory Visit (HOSPITAL_COMMUNITY): Payer: Self-pay

## 2024-01-17 ENCOUNTER — Other Ambulatory Visit (HOSPITAL_COMMUNITY): Payer: Self-pay

## 2024-01-17 DIAGNOSIS — M5116 Intervertebral disc disorders with radiculopathy, lumbar region: Secondary | ICD-10-CM | POA: Diagnosis not present

## 2024-01-17 DIAGNOSIS — M5416 Radiculopathy, lumbar region: Secondary | ICD-10-CM | POA: Diagnosis not present

## 2024-01-17 MED ORDER — ALPRAZOLAM 0.5 MG PO TABS
0.5000 mg | ORAL_TABLET | Freq: Two times a day (BID) | ORAL | 1 refills | Status: AC
  Filled 2024-01-17: qty 60, 30d supply, fill #0
  Filled 2024-04-02: qty 60, 30d supply, fill #1

## 2024-01-31 DIAGNOSIS — E782 Mixed hyperlipidemia: Secondary | ICD-10-CM | POA: Diagnosis not present

## 2024-01-31 DIAGNOSIS — G629 Polyneuropathy, unspecified: Secondary | ICD-10-CM | POA: Diagnosis not present

## 2024-01-31 DIAGNOSIS — E559 Vitamin D deficiency, unspecified: Secondary | ICD-10-CM | POA: Diagnosis not present

## 2024-01-31 DIAGNOSIS — J309 Allergic rhinitis, unspecified: Secondary | ICD-10-CM | POA: Diagnosis not present

## 2024-01-31 DIAGNOSIS — K219 Gastro-esophageal reflux disease without esophagitis: Secondary | ICD-10-CM | POA: Diagnosis not present

## 2024-01-31 DIAGNOSIS — I10 Essential (primary) hypertension: Secondary | ICD-10-CM | POA: Diagnosis not present

## 2024-01-31 DIAGNOSIS — E1140 Type 2 diabetes mellitus with diabetic neuropathy, unspecified: Secondary | ICD-10-CM | POA: Diagnosis not present

## 2024-01-31 DIAGNOSIS — Z0000 Encounter for general adult medical examination without abnormal findings: Secondary | ICD-10-CM | POA: Diagnosis not present

## 2024-01-31 DIAGNOSIS — N183 Chronic kidney disease, stage 3 unspecified: Secondary | ICD-10-CM | POA: Diagnosis not present

## 2024-02-10 ENCOUNTER — Other Ambulatory Visit (HOSPITAL_COMMUNITY): Payer: Self-pay

## 2024-02-12 ENCOUNTER — Other Ambulatory Visit: Payer: Self-pay | Admitting: Internal Medicine

## 2024-02-12 ENCOUNTER — Other Ambulatory Visit (HOSPITAL_COMMUNITY): Payer: Self-pay

## 2024-02-12 MED ORDER — AMPHETAMINE-DEXTROAMPHET ER 20 MG PO CP24
20.0000 mg | ORAL_CAPSULE | Freq: Every day | ORAL | 0 refills | Status: DC
  Filled 2024-02-12: qty 30, 30d supply, fill #0
  Filled 2024-04-02: qty 30, 30d supply, fill #1

## 2024-02-12 NOTE — Telephone Encounter (Signed)
 Last OV 06/24/2023.  Refill request for Adderall .  Please advise.  Allergies  Allergen Reactions   Acetaminophen  Anaphylaxis   Ibuprofen Anaphylaxis   Naproxen Sodium Anaphylaxis   Nsaids Hives, Rash and Anaphylaxis    Face only will spread if not caught early.  Can take ASA   Other Anaphylaxis   Latex Other (See Comments)

## 2024-02-12 NOTE — Telephone Encounter (Signed)
 Adderall  refilled

## 2024-02-20 DIAGNOSIS — M5136 Other intervertebral disc degeneration, lumbar region with discogenic back pain only: Secondary | ICD-10-CM | POA: Diagnosis not present

## 2024-02-24 ENCOUNTER — Telehealth (INDEPENDENT_AMBULATORY_CARE_PROVIDER_SITE_OTHER): Payer: Self-pay

## 2024-02-24 NOTE — Telephone Encounter (Signed)
 Left vm to r/s appointment on 05/04/24

## 2024-03-12 ENCOUNTER — Telehealth (INDEPENDENT_AMBULATORY_CARE_PROVIDER_SITE_OTHER): Payer: Self-pay

## 2024-03-12 ENCOUNTER — Other Ambulatory Visit (INDEPENDENT_AMBULATORY_CARE_PROVIDER_SITE_OTHER): Payer: Self-pay

## 2024-03-12 DIAGNOSIS — R0981 Nasal congestion: Secondary | ICD-10-CM

## 2024-03-12 MED ORDER — FLUTICASONE PROPIONATE 50 MCG/ACT NA SUSP: 2.0000 | Freq: Every day | NASAL | 6 refills | Status: AC

## 2024-03-12 NOTE — Telephone Encounter (Signed)
 I LVM for pt that Dr. Mila sent in a new order under her name for Flonase  to [facility] for him. She could not sign off on Dr. Elene RX she wrote.

## 2024-03-23 ENCOUNTER — Encounter (INDEPENDENT_AMBULATORY_CARE_PROVIDER_SITE_OTHER): Payer: Self-pay

## 2024-03-23 ENCOUNTER — Ambulatory Visit (INDEPENDENT_AMBULATORY_CARE_PROVIDER_SITE_OTHER)

## 2024-03-23 VITALS — BP 108/76 | HR 93 | Temp 98.0°F | Wt 155.0 lb

## 2024-03-23 DIAGNOSIS — J383 Other diseases of vocal cords: Secondary | ICD-10-CM | POA: Diagnosis not present

## 2024-03-23 NOTE — Progress Notes (Signed)
 HPI:   Discussed the use of AI scribe software for clinical note transcription with the patient, who gave verbal consent to proceed.  History of Present Illness William Booth is a 73 year old male who presents with worsening hoarseness following a previous vocal cord injection. He has experienced worsening hoarseness after a period of improvement following a vocal cord injection. The hoarseness is similar to previous episodes, but he has no issues with eating or drinking, and has not experienced choking or coughing.  After the initial injection, he had a full voice for several months, which was a significant improvement from when he first sought medical attention and could barely talk, only speaking 'above a whisper.' He estimates that the injection was performed around May, providing relief for approximately six months.    PMH/Meds/All/SocHx/FamHx/ROS: Past Medical History:  Diagnosis Date   Anxiety    Diabetes mellitus    type 2   GERD (gastroesophageal reflux disease)    Hypertension    Hypothyroidism    pt states no thyroid  problems   Low HDL (under 40)    Narcolepsy    Peripheral neuropathy    not taking any meds at this time   Sleep apnea    uses CPAP, last sleep study 1999, had titration 11/2011   Type II diabetes mellitus with stage 2 chronic kidney disease (HCC) 03/19/2016   Umbilical hernia    Past Surgical History:  Procedure Laterality Date   broken finger     right hand   CARDIAC CATHETERIZATION  06/2009   CLOSED REDUCTION FINGER WITH PERCUTANEOUS PINNING Left 09/30/2013   Procedure: LEFT SMALL FINGER METACARPAL CLOSED REDUCTION WITH PERCUTANEOUS PINNING;  Surgeon: Prentice LELON Pagan, MD;  Location: [facility] OR;  Service: Orthopedics;  Laterality: Left;   COLONOSCOPY     CYSTOSCOPY W/ URETERAL STENT PLACEMENT Left 05/27/2017   Procedure: CYSTOSCOPY WITH RETROGRADE PYELOGRAM/URETERAL STENT PLACEMENT;  Surgeon: Devere Lonni Righter, MD;  Location[facility];  Service: Urology;  Laterality: Left;  ONLY NEEDS 15 MIN   CYSTOSCOPY/URETEROSCOPY/HOLMIUM LASER/STENT PLACEMENT Left 06/07/2017   Procedure: CYSTOSCOPY/URETEROSCOPY/HOLMIUM LASER/STENT PLACEMENT;  Surgeon: Devere Lonni Righter, MD;  Location: [facility];  Service: Urology;  Laterality: Left;  ONLY NEEDS 30 MIN FOR PROCEDURE   GASTRIC RESTRICTION SURGERY  02/18/06   lap band   TOOTH EXTRACTION  02/28/12   UMBILICAL HERNIA REPAIR  03/20/2012   Procedure: LAPAROSCOPIC UMBILICAL HERNIA;  Surgeon: Alm VEAR Angle, MD;  Location: [facility] OR;  Service: General;  Laterality: N/A;   No family history of bleeding disorders, wound healing problems or difficulty with anesthesia.  Social Connections: Not on file    Current Outpatient Medications:    fluticasone  (FLONASE ) 50 MCG/ACT nasal spray, Place 2 sprays into both nostrils daily., Disp: 16 g, Rfl: 6   ALPRAZolam  (XANAX ) 0.5 MG tablet, Take 1 tablet (0.5 mg total) by mouth twice a day as needed, Disp: 60 tablet, Rfl: 2   ALPRAZolam  (XANAX ) 0.5 MG tablet, Take 1 tablet (0.5 mg total) by mouth 2 (two) times daily as needed., Disp: 60 tablet, Rfl: 2   ALPRAZolam  (XANAX ) 0.5 MG tablet, Take 1 tablet (0.5 mg total) by mouth 2 (two) times daily as needed., Disp: 60 tablet, Rfl: 1   amitriptyline  (ELAVIL ) 100 MG tablet, Take 1 tablet (100 mg total) by mouth at bedtime., Disp: 90 tablet, Rfl: 1   amphetamine -dextroamphetamine  (ADDERALL  XR) 20 MG 24 hr capsule, Take 1 capsule (20 mg) by mouth daily.,  Disp: 90 capsule, Rfl: 0   atenolol  (TENORMIN ) 25 MG tablet, Take 1 tablet (25 mg total) by mouth 2 (two) times daily., Disp: 180 tablet, Rfl: 1   cetirizine  (ZYRTEC ) 10 MG tablet, Take 1 tablet (10 mg total) by mouth daily., Disp: 30 tablet, Rfl: 11   empagliflozin  (JARDIANCE ) 10 MG TABS tablet, Take 1 tablet (10 mg total) by mouth daily., Disp: 90 tablet, Rfl: 1   ezetimibe  (ZETIA ) 10 MG tablet, Take 1 tablet by mouth once a day, Disp: 90  tablet, Rfl: 3   famotidine  (PEPCID ) 20 MG tablet, Take 1 tablet (20 mg total) by mouth at bedtime., Disp: 30 tablet, Rfl: 3   metFORMIN  (GLUCOPHAGE ) 500 MG tablet, Take 1 tablet (500 mg total) by mouth 2 (two) times daily with a meal., Disp: 180 tablet, Rfl: 1   methylPREDNISolone  (MEDROL  DOSEPAK) 4 MG TBPK tablet, Take as directed on package with signs of chronic sinusitis., Disp: 21 each, Rfl: 1   morphine  (MSIR) 15 MG tablet, Take 1 tablet (15 mg total) by mouth 2 (two) times daily as needed. No alcohol with this medication., Disp: 60 tablet, Rfl: 0   MOUNJARO 5 MG/0.5ML Pen, Inject 5 mg into the skin once a week., Disp: , Rfl:    naloxone  (NARCAN ) nasal spray 4 mg/0.1 mL, Place one spray in one nostril every 3 minutes until patient awakes or EMS arrives., Disp: 2 each, Rfl: 0   omeprazole  (PRILOSEC) 40 MG capsule, Take 1 capsule (40 mg total) by mouth 30 minutes before morning meal., Disp: 90 capsule, Rfl: 1   rosuvastatin  (CRESTOR ) 40 MG tablet, Take 1 tablet (40 mg total) by mouth daily., Disp: 90 tablet, Rfl: 1   sildenafil  (VIAGRA ) 50 MG tablet, Take 1 tablet by mouth once a day as needed, 30 minutes before sex, Disp: 30 tablet, Rfl: 1   sodium chloride  (OCEAN) 0.65 % SOLN nasal spray, Place 1 spray into both nostrils as needed for congestion., Disp: 44 mL, Rfl: 6   vitamin B-12 (CYANOCOBALAMIN) 1000 MCG tablet, Take 1,000 mcg by mouth daily., Disp: , Rfl:  A complete ROS was performed with pertinent positives/negatives noted in the HPI. The remainder of the ROS are negative.   Physical Exam:  There were no vitals taken for this visit. General: Well developed, well nourished. No acute distress. Voice hoarse, breathy Head/Face: Normocephalic. No sinus tenderness. Facial nerve intact and equal bilaterally. No facial lacerations. Eyes: PERRL, no scleral icterus or conjunctival hemorrhage. EOMI. Ears: No gross deformity. Normal external canal. Tympanic membrane in tact bilaterally Hearing:  Normal speech reception.  Nose: No gross deformity or lesions. No purulent discharge. No turbinate hypertrophy. Mouth/Oropharynx: Lips without any lesions. No mucosal lesions within the oropharynx. No tonsillar enlargement, exudate, or lesions. Pharyngeal walls symmetrical. Uvula midline. Tongue midline without lesions. Larynx: See TFL if applicable Nasopharynx: See TFL if applicable Neck: Trachea midline. No masses. No thyromegaly or nodules palpated. No crepitus. Lymphatic: No lymphadenopathy in the neck. Respiratory: No stridor or distress. Room air. Cardiovascular: Regular rate and rhythm. Extremities: No edema or cyanosis. Warm and well-perfused. Skin: No scars or lesions on face or neck. Neurologic: CN II-XII grossly intact. Moving all extremities without gross abnormality. Other:  Independent Review of Additional Tests or Records: None  Procedures: Flexible Fiberoptic Laryngoscopy Procedure Note Pre-Op Diagnosis: glottic insufficiency    Post Op Diagnosis: same Procedure: Flexible Fiberoptic Laryngoscopy CPT 31575 - Mod 25 Surgeon: Adah Malkin, D.O. Anesthesia: 4% Lidocaine  with Afrin Findings:  - bilateral  vocal fold atrophy with persistent glottic gap - bilateral vocal folds mobile Procedure Detail: After verbal consent was obtained from the patient, the patient was brought in an upright position, a fiberoptic nasal laryngoscope was then passed into the patient right nasal passage and left nasal passage, the right appeared to be more patent. It was passed along the floor of the nasal cavity to the nasopharynx. Torus tubarius was patent and the Fossa of Rosenmller was identified. The scope was then flexed caudally and advanced slowly through the nasopharynx, passed through the oropharynx, and down into the hypopharynx. The patient's oro- and nasopharynx were unremarkable with no signs of any gross lesions, edema, masses, or bleeding.  The base of tongue was visualized and no  mass, ulceration or lesion was appreciated. The epiglottis did not demonstrate any mass, ulceration or lesion. The vallecula was also assessed with no mass, ulceration or lesion. The patient had a persistent glottic gap upon phonation and no signs of aspiration or pooling of secretions. Bilateral vocal folds were noted to be atrophic. The patient was asked to inspire and expire with the true vocal folds vibrating normally and without evidence of vocal fold dysfunction. The true and false vocal cords, interarytenoid, AE folds, and arytenoids did not demonstrate any significant edema or erythema. The patient was then asked to valsalva, and the pyriform sinuses were assessed which were unremarkable. The airway was patent and there was no evidence of compromise. The scope was then slowly withdrawn from the patient. The patient tolerated the procedure well and there were no complications.  Disposition: Stable  Impression & Plans: Assessment & Plan Vocal cord atrophy with glottic insufficiency Recurrent hoarseness due to glottic insufficiency. Previous injection effective for six months. Current gap between vocal cords indicates need for another injection.  - Scheduled vocal cord injection with Dr. Anice - Discussed permanent implant option as patient was curious regarding this surgery however he would like to proceed with injections at this time   Follow-up 3 weeks post-op  Adah Malkin, DO [facility]

## 2024-04-06 ENCOUNTER — Ambulatory Visit: Payer: Medicare Other | Admitting: Internal Medicine

## 2024-04-06 NOTE — Progress Notes (Unsigned)
 HPI  male never smoker followed for OSA, complicated by HBP, GERD,  DM 2, hypothyroid, bariatric lap band surgery 2007, kidney stones   -----------------------------------------------------   04/02/22- 73 year old male never smoker followed for OSA,  complicated by HBP, GERD, DM 2, hypothyroid, Bariatric lap band surgery 2007, HBP, lumbar DDD,  -alprazolam  0.5 mg bid, amitriptyline  100, adderall  XR 30, CPAP  auto 5-15/ [employer] Download-compliance 83%, AHI 0.7/ hr Body weight today-192 lbs Covid vax-4 Moderna, 2 Phizer, 4 Moderna RSV vax- had Flu vax-had Reports doing well. Breathing is comfortable with no new concerns. Download reviewed. Sleeps better with CPAP  04/04/23-  73 year old male never smoker followed for OSA,  complicated by HBP, GERD, DM 2, hypothyroid, Bariatric lap band surgery 2007, HBP, lumbar DDD,  -alprazolam  0.5 mg bid, amitriptyline  100, adderall  XR 30, CPAP  auto 5-15/ [employer] Discussed the use of AI scribe software for clinical note transcription with the patient, who gave verbal consent to proceed.  History of Present Illness   The patient, a 73 year old male with a history of obstructive sleep apnea, hypertension, GERD, type 2 diabetes, hypothyroidism, and lumbar degenerative disc disease, presents with chronic hoarseness and a dry cough. The hoarseness has been constant since October and has not improved with a course of prednisone . The patient notes that his voice temporarily improved following an endoscopy and colonoscopy in October, but the hoarseness returned after a week or two. The patient also reports chronic acid reflux and has been losing his voice over the past few months.  In addition to the hoarseness, the patient has a dry cough that is hard to stop once it starts. The patient is currently using a CPAP machine for his obstructive sleep apnea, with a compliance rate of 80% and an AHI of 3.1 per hour. The patient reports not sleeping well, despite taking  alprazolam  and amitriptyline , which are known to aid sleep. The patient also occasionally takes morphine  for back pain related to his lumbar degenerative disc disease.   His weight is now near goal after bariatric surgery, down from max around 400 lbs.  Assessment and Plan:    Hoarseness Chronic hoarseness since October, worsens in the evening. Brief improvement post-endoscopy. Associated with chronic acid reflux. Dry cough present. -Order chest X-ray today. -Refer to ENT for further evaluation.  Obstructive Sleep Apnea On CPAP therapy with pressure range 5-15. Compliance 80%, AHI 3.1 per hour. Reports not sleeping well. -Continue current CPAP settings. -Replace CPAP machine as it is due for replacement.  Gastroesophageal Reflux Disease Chronic acid reflux, possibly contributing to hoarseness. -Continue management with PCP.  Hypertension, GERD, DM2, Hypothyroidism, Lumbar Degenerative Disc Disease Chronic conditions, no acute issues discussed. -Continue current management.    04/07/24- 73 year old male never smoker followed for OSA,  complicated by HBP, GERD, DM 2, hypothyroid, Bariatric lap band surgery 2007, HBP, lumbar DDD,  -alprazolam  0.5 mg bid, amitriptyline  100, adderall  XR 30, CPAP  auto 5-15/ [employer] Download compliance-73%, AHI 1.6/hr Body weight today-156 lbs      ROS-see HPI + = positive Constitutional:  , No-night sweats, fevers, chills, fatigue, lassitude. HEENT:   No-  headaches, difficulty swallowing, tooth/dental problems, sore throat,       No-  sneezing, itching, ear ache, nasal congestion, post nasal drip,  CV:  No-   chest pain, orthopnea, PND, swelling in lower extremities, anasarca,  dizziness, palpitations Resp: No-   shortness of brea th with exertion or at rest.  No-   productive cough,  + non-productive cough,  No- coughing up of blood.              No-   change in color of mucus.  No- wheezing.   Skin: No-   rash or lesions. GI:  No-    heartburn, indigestion, abdominal pain, nausea, vomiting,  GU: MS:  No-   joint pain or swelling.   Neuro-     nothing unusual Psych:  No- change in mood or affect. No depression or anxiety.  No memory loss.  OBJ- Physical Exam General- Alert, Oriented, Affect-appropriate, Distress- none acute, Not obese Skin- rash-none, lesions- none, excoriation- none Lymphadenopathy- none found Head- atraumatic            Eyes- Gross vision intact, PERRLA, conjunctivae and secretions clear            Ears- Hearing, canals-normal            Nose- Clear, no-Septal dev, mucus, polyps, erosion, perforation             Throat- Mallampati III , mucosa clear , drainage- none, tonsils- atrophic, +hoarse without stridor Neck- flexible , trachea midline, no stridor , thyroid  nl, carotid no bruit Chest - symmetrical excursion , unlabored           Heart/CV- RRR , no murmur , no gallop  , no rub, nl s1 s2                           - JVD- none , edema- none, stasis changes- none, varices- none           Lung- clear to P&A, wheeze- none, cough- none while here , dullness-none, rub- none           Chest wall-  Abd-  Br/ Gen/ Rectal- Not done, not indicated Extrem- cyanosis- none, clubbing, none, atrophy- none, strength- nl Neuro- grossly intact to observation

## 2024-04-07 ENCOUNTER — Ambulatory Visit: Admitting: Internal Medicine

## 2024-04-07 ENCOUNTER — Other Ambulatory Visit (HOSPITAL_COMMUNITY): Payer: Self-pay

## 2024-04-07 ENCOUNTER — Encounter: Payer: Self-pay | Admitting: Internal Medicine

## 2024-04-07 MED ORDER — AMPHETAMINE-DEXTROAMPHET ER 20 MG PO CP24
20.0000 mg | ORAL_CAPSULE | Freq: Every day | ORAL | 0 refills | Status: AC
  Filled 2024-04-07: qty 30, 30d supply, fill #0

## 2024-04-07 NOTE — Patient Instructions (Signed)
 We can continue CPAP auto 5-15  Adderall  refilled  At checkout, ask the front desk to bring you back with a sleep doctor. We mentioned Dr Jude who works at [email or web address].

## 2024-04-08 ENCOUNTER — Other Ambulatory Visit (HOSPITAL_COMMUNITY): Payer: Self-pay

## 2024-04-08 ENCOUNTER — Other Ambulatory Visit: Payer: Self-pay

## 2024-04-27 ENCOUNTER — Ambulatory Visit (INDEPENDENT_AMBULATORY_CARE_PROVIDER_SITE_OTHER)

## 2024-04-27 ENCOUNTER — Encounter (INDEPENDENT_AMBULATORY_CARE_PROVIDER_SITE_OTHER): Payer: Self-pay

## 2024-04-27 VITALS — BP 100/66 | HR 79 | Temp 98.0°F | Wt 156.0 lb

## 2024-04-27 DIAGNOSIS — J383 Other diseases of vocal cords: Secondary | ICD-10-CM | POA: Diagnosis not present

## 2024-04-27 DIAGNOSIS — R490 Dysphonia: Secondary | ICD-10-CM | POA: Diagnosis not present

## 2024-04-27 DIAGNOSIS — R0981 Nasal congestion: Secondary | ICD-10-CM | POA: Diagnosis not present

## 2024-04-27 NOTE — Progress Notes (Signed)
 HPI:   Discussed the use of AI scribe software for clinical note transcription with the patient, who gave verbal consent to proceed.  History of Present Illness William Booth is a 73 year old male who presents with worsening hoarseness following a previous vocal cord injection. He has experienced worsening hoarseness after a period of improvement following a vocal cord injection. The hoarseness is similar to previous episodes, but he has no issues with eating or drinking, and has not experienced choking or coughing.  After the initial injection, he had a full voice for several months, which was a significant improvement from when he first sought medical attention and could barely talk, only speaking 'above a whisper.' He estimates that the injection was performed around May, providing relief for approximately six months.  He reports that the injection lasted closer to 8 or 9 months to me. No issues swallowing or drinking. Understands throat numbness for severa hours after prccedure.    PMH/Meds/All/SocHx/FamHx/ROS: Past Medical History:  Diagnosis Date   Anxiety    Diabetes mellitus    type 2   GERD (gastroesophageal reflux disease)    Hypertension    Hypothyroidism    pt states no thyroid  problems   Low HDL (under 40)    Narcolepsy    Peripheral neuropathy    not taking any meds at this time   Sleep apnea    uses CPAP, last sleep study 1999, had titration 11/2011   Type II diabetes mellitus with stage 2 chronic kidney disease (HCC) 03/19/2016   Umbilical hernia    Past Surgical History:  Procedure Laterality Date   broken finger     right hand   CARDIAC CATHETERIZATION  06/2009   CLOSED REDUCTION FINGER WITH PERCUTANEOUS PINNING Left 09/30/2013   Procedure: LEFT SMALL FINGER METACARPAL CLOSED REDUCTION WITH PERCUTANEOUS PINNING;  Surgeon: Prentice LELON Pagan, MD;  Location: [facility] OR;  Service: Orthopedics;  Laterality: Left;   COLONOSCOPY     CYSTOSCOPY W/ URETERAL STENT  PLACEMENT Left 05/27/2017   Procedure: CYSTOSCOPY WITH RETROGRADE PYELOGRAM/URETERAL STENT PLACEMENT;  Surgeon: Devere Lonni Righter, MD;  Location: [facility];  Service: Urology;  Laterality: Left;  ONLY NEEDS 15 MIN   CYSTOSCOPY/URETEROSCOPY/HOLMIUM LASER/STENT PLACEMENT Left 06/07/2017   Procedure: CYSTOSCOPY/URETEROSCOPY/HOLMIUM LASER/STENT PLACEMENT;  Surgeon: Devere Lonni Righter, MD;  Location: [facility];  Service: Urology;  Laterality: Left;  ONLY NEEDS 30 MIN FOR PROCEDURE   GASTRIC RESTRICTION SURGERY  02/18/06   lap band   TOOTH EXTRACTION  02/28/12   UMBILICAL HERNIA REPAIR  03/20/2012   Procedure: LAPAROSCOPIC UMBILICAL HERNIA;  Surgeon: Alm VEAR Angle, MD;  Location: [facility] OR;  Service: General;  Laterality: N/A;   No family history of bleeding disorders, wound healing problems or difficulty with anesthesia.  Social Connections: Not on file    Current Outpatient Medications:    ALPRAZolam  (XANAX ) 0.5 MG tablet, Take 1 tablet (0.5 mg total) by mouth 2 (two) times daily as needed., Disp: 60 tablet, Rfl: 2   ALPRAZolam  (XANAX ) 0.5 MG tablet, Take 1 tablet (0.5 mg total) by mouth 2 (two) times daily as needed., Disp: 60 tablet, Rfl: 1   amitriptyline  (ELAVIL ) 100 MG tablet, Take 1 tablet (100 mg total) by mouth at bedtime., Disp: 90 tablet, Rfl: 1   amphetamine -dextroamphetamine  (ADDERALL  XR) 20 MG 24 hr capsule, Take 1 capsule (20 mg) by mouth daily., Disp: 90 capsule, Rfl: 0   atenolol  (TENORMIN ) 25 MG tablet, Take 1 tablet (25 mg  total) by mouth 2 (two) times daily., Disp: 180 tablet, Rfl: 1   cetirizine  (ZYRTEC ) 10 MG tablet, Take 1 tablet (10 mg total) by mouth daily., Disp: 30 tablet, Rfl: 11   empagliflozin  (JARDIANCE ) 10 MG TABS tablet, Take 1 tablet (10 mg total) by mouth daily., Disp: 90 tablet, Rfl: 1   ezetimibe  (ZETIA ) 10 MG tablet, Take 1 tablet by mouth once a day, Disp: 90 tablet, Rfl: 3   famotidine  (PEPCID ) 20 MG tablet, Take 1  tablet (20 mg total) by mouth at bedtime., Disp: 30 tablet, Rfl: 3   fluticasone  (FLONASE ) 50 MCG/ACT nasal spray, Place 2 sprays into both nostrils daily., Disp: 16 g, Rfl: 6   meloxicam (MOBIC) 15 MG tablet, Take 15 mg by mouth daily as needed., Disp: , Rfl:    metFORMIN  (GLUCOPHAGE ) 500 MG tablet, Take 1 tablet (500 mg total) by mouth 2 (two) times daily with a meal., Disp: 180 tablet, Rfl: 1   methylPREDNISolone  (MEDROL  DOSEPAK) 4 MG TBPK tablet, Take as directed on package with signs of chronic sinusitis., Disp: 21 each, Rfl: 1   morphine  (MSIR) 15 MG tablet, Take 1 tablet (15 mg total) by mouth 2 (two) times daily as needed. No alcohol with this medication., Disp: 60 tablet, Rfl: 0   MOUNJARO 5 MG/0.5ML Pen, Inject 5 mg into the skin once a week., Disp: , Rfl:    naloxone  (NARCAN ) nasal spray 4 mg/0.1 mL, Place one spray in one nostril every 3 minutes until patient awakes or EMS arrives., Disp: 2 each, Rfl: 0   omeprazole  (PRILOSEC) 40 MG capsule, Take 1 capsule (40 mg total) by mouth 30 minutes before morning meal., Disp: 90 capsule, Rfl: 1   rosuvastatin  (CRESTOR ) 40 MG tablet, Take 1 tablet (40 mg total) by mouth daily., Disp: 90 tablet, Rfl: 1   sildenafil  (VIAGRA ) 50 MG tablet, Take 1 tablet by mouth once a day as needed, 30 minutes before sex, Disp: 30 tablet, Rfl: 1   sodium chloride  (OCEAN) 0.65 % SOLN nasal spray, Place 1 spray into both nostrils as needed for congestion., Disp: 44 mL, Rfl: 6   vitamin B-12 (CYANOCOBALAMIN) 1000 MCG tablet, Take 1,000 mcg by mouth daily., Disp: , Rfl:  A complete ROS was performed with pertinent positives/negatives noted in the HPI. The remainder of the ROS are negative.   Physical Exam:  BP 100/66 (BP Location: Left Arm, Patient Position: Sitting, Cuff Size: Normal)   Pulse 79   Temp 98 F (36.7 C)   Wt 156 lb (70.8 kg)   SpO2 97%   BMI 22.38 kg/m  General: Well developed, well nourished. No acute distress. Voice hoarse, breathy Head/Face:  Normocephalic. No sinus tenderness. Facial nerve intact and equal bilaterally. No facial lacerations. Eyes: PERRL, no scleral icterus or conjunctival hemorrhage. EOMI. Ears: No gross deformity. Normal external canal. Tympanic membrane in tact bilaterally Hearing: Normal speech reception.  Nose: No gross deformity or lesions. No purulent discharge. No turbinate hypertrophy. Mouth/Oropharynx: Lips without any lesions. No mucosal lesions within the oropharynx. No tonsillar enlargement, exudate, or lesions. Pharyngeal walls symmetrical. Uvula midline. Tongue midline without lesions. Larynx: See TFL if applicable Nasopharynx: See TFL if applicable Neck: Trachea midline. No masses. No thyromegaly or nodules palpated. No crepitus. Lymphatic: No lymphadenopathy in the neck. Respiratory: No stridor or distress. Room air. Cardiovascular: Regular rate and rhythm. Extremities: No edema or cyanosis. Warm and well-perfused. Skin: No scars or lesions on face or neck. Neurologic: CN II-XII grossly intact. Moving all  extremities without gross abnormality. Other:   Independent Review of Additional Tests or Records: None  Procedures: Flexible Fiberoptic Laryngoscopy Procedure Note Pre-Op Diagnosis: glottic insufficiency    Post Op Diagnosis: same Procedure: Flexible Fiberoptic Laryngoscopy CPT 68424 - Mod 25 Surgeon: Penne Croak  Anesthesia: 4% Lidocaine  with Afrin Findings:  - bilateral vocal fold atrophy with persistent glottic gap - bilateral vocal folds mobile Procedure Detail: After verbal consent was obtained from the patient, the patient was brought in an upright position, a fiberoptic nasal laryngoscope was then passed into the patient right nasal passage and left nasal passage, the right appeared to be more patent. It was passed along the floor of the nasal cavity to the nasopharynx. Torus tubarius was patent and the Fossa of Rosenmller was identified. The scope was then flexed caudally and  advanced slowly through the nasopharynx, passed through the oropharynx, and down into the hypopharynx. The patient's oro- and nasopharynx were unremarkable with no signs of any gross lesions, edema, masses, or bleeding.  The base of tongue was visualized and no mass, ulceration or lesion was appreciated. The epiglottis did not demonstrate any mass, ulceration or lesion. The vallecula was also assessed with no mass, ulceration or lesion. The patient had a persistent glottic gap upon phonation and no signs of aspiration or pooling of secretions. Bilateral vocal folds were noted to be atrophic. The patient was asked to inspire and expire with the true vocal folds vibrating normally and without evidence of vocal fold dysfunction. The true and false vocal cords, interarytenoid, AE folds, and arytenoids did not demonstrate any significant edema or erythema. The patient was then asked to valsalva, and the pyriform sinuses were assessed which were unremarkable. The airway was patent and there was no evidence of compromise. The scope was then slowly withdrawn from the patient. The patient tolerated the procedure well and there were no complications.  Disposition: Stable  Therapeutic Vocal Cord Injection CPT 31570-50 ATTENDING: Penne Croak, DO   PREOPERATIVE DIAGNOSIS(ES): 1. Vf atrophy bilateral 2. Hoarseness;  3. Glottic insufficiency  POSTOPERATIVE DIAGNOSIS(ES): Same  PROCEDURE PERFORMED: Laryngoscopy with restylane injection into the bilateral lateral thyroarytenoid muscle(s)  INDICATIONS FOR PROCEDURE: The risks and benefits of the surgical procedure have been explained in detail to the patient and they have elected to proceed.  CONSENT:  Informed consent was obtained prior to the procedure after discussion of risks, benefits, and alternatives and expected outcomes were discussed with the patient; consent placed in chart. The possibilities of reaction to medication, pulmonary aspiration, bleeding,  infection, the need for additional procedures, failure to diagnose a condition, and creating a complication requiring transfusion or operation were discussed with the patient. The patient concurred with the proposed plan, giving informed consent.    UNIVERSAL PROTOCOL/ TIMEOUT: Preprocedure verification is complete- patient verified and consents confirmed.  ANESTHESIA: local anesthesia H&P REVIEW: The patient's history and physical were reviewed today prior to procedure. All medications were reviewed and updated as well.  PROCEDURE DETAILS:  The patient was brought to the clinic and placed in a seated position.  The anterior neck skin was then cleansed with alcohol and 1% Lidocaine  was used to infiltrate the skin overlying the thyrohyoid membrane. Patient had a NEB with 4% lidocaine  prior the start of the procedure, to ensure good anesthesia and procedure tolerance. Afrin/Lidocaine  mixture was then used to anesthetize the nasal passages. The 1.5-inch 25-gauge needle was bent with two gentle curves in same direction.  The flexible laryngoscope was then passed through the  patients nasal passageway and advanced into the larynx. The nasopharynx was free of any lesions. The scope was passed behind the soft palate and directed toward the base of tongue. The supraglottic structures were normal in appearance. The true folds show atrophy and left vocal fold paralysis. The 23-gauge needle was passed through the petiole and 4% lidocaine  was dripped on the cords while the patient phonated. It was then attached to a luer-lock syringe filled with the filler and advanced into the vocal fold, and injection was performed into the bilateral thyroarytenoid muscle(s) at a site just anterior to the vocal process. A second injection was performed at mid-fold. The augmentation carried out until some overmedialization was noted. This was then repeated on the contralateral side. The needle was then removed from the vocal fold and  the airway. The scope was removed from the nasal cavity. This completed the procedure. The patient tolerated the procedure well. IMPLANTS: Restylane-L Hyaluronic Acid  ESTIMATED BLOOD LOSS: None  SPECIMEN(S) REMOVED: None  DISPOSITION OF SPECIMEN(S): NA.  FINDINGS:  No evidence of a hematoma and the airway remained patent  CONDITION: Stable  COMPLICATIONS:The patient tolerated the procedure well without apparent complications and was ambulatory.  NOTES: will likely require repeat injection on the left side 2/2 severe atrophy, scoped through left side   PLAN: RTC 4 weeks for repeat videostrobe   Impression & Plans: Assessment & Plan Vocal cord atrophy with glottic insufficiency Recurrent hoarseness due to glottic insufficiency. Previous injection effective for over six months. Current gap between vocal cords indicates need for another injection.   Successful injection today through left nare. Tolerated excellently. Over inflated. He understands that this will improve after 1-3 weeks.      Penne Croak 04/27/2024 3:04 PM Available on [employer]iku chat and [employer]  Department of Otolaryngology Contact Info: Otolaryngology [employer] including questions about appointments or questions: [phone] - If after normal business hours (Monday-Friday after 5PM or Weekends/Holidays), please call same number and follow prompts for Patient Access Line. There is a physician on call for urgent matters. For life threatening emergencies, please call [phone]

## 2024-04-27 NOTE — Patient Instructions (Signed)

## 2024-05-04 ENCOUNTER — Ambulatory Visit (INDEPENDENT_AMBULATORY_CARE_PROVIDER_SITE_OTHER): Admitting: Otolaryngology

## 2024-06-02 ENCOUNTER — Encounter (INDEPENDENT_AMBULATORY_CARE_PROVIDER_SITE_OTHER): Payer: Self-pay

## 2024-06-02 ENCOUNTER — Ambulatory Visit (INDEPENDENT_AMBULATORY_CARE_PROVIDER_SITE_OTHER)

## 2024-06-02 VITALS — BP 93/62 | HR 85 | Wt 154.0 lb

## 2024-06-02 DIAGNOSIS — R0981 Nasal congestion: Secondary | ICD-10-CM

## 2024-06-02 DIAGNOSIS — R0982 Postnasal drip: Secondary | ICD-10-CM | POA: Diagnosis not present

## 2024-06-02 DIAGNOSIS — J300 Vasomotor rhinitis: Secondary | ICD-10-CM | POA: Diagnosis not present

## 2024-06-02 DIAGNOSIS — J383 Other diseases of vocal cords: Secondary | ICD-10-CM

## 2024-06-02 DIAGNOSIS — R490 Dysphonia: Secondary | ICD-10-CM

## 2024-06-02 MED ORDER — IPRATROPIUM BROMIDE 0.03 % NA SOLN: 2.0000 | Freq: Three times a day (TID) | NASAL | 12 refills | Status: AC | PRN

## 2024-06-02 NOTE — Patient Instructions (Signed)
" °  VISIT SUMMARY: During your visit, we discussed your persistent hoarseness and vocal issues, as well as your ongoing rhinorrhea and postnasal drip. We performed a laryngoscopic examination and reviewed your current treatments.  YOUR PLAN: -VOCAL CORD ATROPHY WITH GLOTTIC INSUFFICIENCY: This condition involves the thinning of the vocal cords, leading to incomplete closure and voice issues. Your laryngoscopic examination showed improved vocal cord thickness and closure. Continue using the voice exercises from your speech therapy. We will follow up in three months, or sooner if your symptoms worsen or you want an earlier evaluation.  -ALLERGIC RHINITIS WITH POSTNASAL DRIP: This condition involves inflammation of the nasal passages due to allergies, causing a runny nose and mucus dripping down the throat. Despite your current medications, your symptoms persist. We have prescribed ipratropium bromide  nasal spray to use as needed, up to twice daily, or more frequently if required. The prescription has been sent for home delivery with a three-month supply and refills as needed.  INSTRUCTIONS: Please follow up in three months for your vocal cord atrophy, or sooner if your symptoms worsen or you desire an earlier evaluation.    Contains text generated by [employer].   "

## 2024-06-04 ENCOUNTER — Other Ambulatory Visit (HOSPITAL_COMMUNITY): Payer: Self-pay

## 2024-06-04 MED ORDER — ALPRAZOLAM 0.5 MG PO TABS
0.5000 mg | ORAL_TABLET | Freq: Two times a day (BID) | ORAL | 2 refills | Status: AC | PRN
  Filled 2024-06-04: qty 60, 30d supply, fill #0

## 2024-06-05 ENCOUNTER — Other Ambulatory Visit: Payer: Self-pay

## 2024-10-06 ENCOUNTER — Ambulatory Visit (INDEPENDENT_AMBULATORY_CARE_PROVIDER_SITE_OTHER)
